# Patient Record
Sex: Female | Born: 1935 | Race: Black or African American | Hispanic: No | Marital: Married | Smoking: Former smoker
Health system: Southern US, Community
[De-identification: ages and names within clinical notes are randomized; demographics above are authoritative.]

## PROBLEM LIST (undated history)

## (undated) DIAGNOSIS — I10 Essential (primary) hypertension: Secondary | ICD-10-CM

## (undated) DIAGNOSIS — I517 Cardiomegaly: Secondary | ICD-10-CM

## (undated) DIAGNOSIS — J189 Pneumonia, unspecified organism: Secondary | ICD-10-CM

## (undated) DIAGNOSIS — C541 Malignant neoplasm of endometrium: Secondary | ICD-10-CM

## (undated) DIAGNOSIS — E785 Hyperlipidemia, unspecified: Secondary | ICD-10-CM

## (undated) DIAGNOSIS — R569 Unspecified convulsions: Secondary | ICD-10-CM

## (undated) DIAGNOSIS — M1990 Unspecified osteoarthritis, unspecified site: Secondary | ICD-10-CM

## (undated) HISTORY — DX: Malignant neoplasm of endometrium: C54.1

## (undated) HISTORY — DX: Hyperlipidemia, unspecified: E78.5

## (undated) HISTORY — DX: Unspecified convulsions: R56.9

## (undated) HISTORY — PX: SPINE SURGERY: SHX786

## (undated) HISTORY — DX: Essential (primary) hypertension: I10

## (undated) HISTORY — DX: Unspecified osteoarthritis, unspecified site: M19.90

## (undated) HISTORY — PX: APPENDECTOMY: SHX54

## (undated) HISTORY — PX: TONSILLECTOMY: SUR1361

## (undated) HISTORY — DX: Cardiomegaly: I51.7

---

## 2001-02-21 ENCOUNTER — Inpatient Hospital Stay (HOSPITAL_COMMUNITY): Admission: RE | Admit: 2001-02-21 | Discharge: 2001-02-22 | Payer: Self-pay | Admitting: Orthopaedic Surgery

## 2003-06-19 ENCOUNTER — Emergency Department (HOSPITAL_COMMUNITY): Admission: EM | Admit: 2003-06-19 | Discharge: 2003-06-19 | Payer: Self-pay | Admitting: Emergency Medicine

## 2005-12-14 ENCOUNTER — Ambulatory Visit: Payer: Self-pay | Admitting: Internal Medicine

## 2005-12-22 ENCOUNTER — Ambulatory Visit: Payer: Self-pay | Admitting: Family Medicine

## 2006-01-26 ENCOUNTER — Ambulatory Visit: Payer: Self-pay | Admitting: Internal Medicine

## 2006-01-27 ENCOUNTER — Encounter: Payer: Self-pay | Admitting: Family Medicine

## 2006-02-03 ENCOUNTER — Ambulatory Visit: Payer: Self-pay | Admitting: Family Medicine

## 2006-02-06 ENCOUNTER — Encounter: Payer: Self-pay | Admitting: Family Medicine

## 2006-02-06 ENCOUNTER — Encounter: Admission: RE | Admit: 2006-02-06 | Discharge: 2006-02-06 | Payer: Self-pay | Admitting: Family Medicine

## 2006-02-10 ENCOUNTER — Ambulatory Visit: Payer: Self-pay | Admitting: Family Medicine

## 2006-02-16 ENCOUNTER — Ambulatory Visit: Payer: Self-pay | Admitting: Family Medicine

## 2006-03-24 ENCOUNTER — Ambulatory Visit: Payer: Self-pay | Admitting: Family Medicine

## 2006-04-26 ENCOUNTER — Ambulatory Visit: Payer: Self-pay | Admitting: Family Medicine

## 2006-06-23 ENCOUNTER — Ambulatory Visit: Payer: Self-pay | Admitting: Family Medicine

## 2006-06-23 LAB — CONVERTED CEMR LAB
Cholesterol: 140 mg/dL (ref 0–200)
HDL: 33.1 mg/dL — ABNORMAL LOW (ref >39.0)
Hgb A1c MFr Bld: 8.2 % — ABNORMAL HIGH (ref 4.6–6.0)
LDL Cholesterol: 87 mg/dL (ref 0–99)
Total CHOL/HDL Ratio: 4.2
Triglycerides: 99 mg/dL (ref 0–149)
VLDL: 20 mg/dL (ref 0–40)

## 2006-06-27 ENCOUNTER — Ambulatory Visit: Payer: Self-pay | Admitting: Family Medicine

## 2006-08-10 ENCOUNTER — Encounter: Payer: Self-pay | Admitting: Family Medicine

## 2006-08-10 DIAGNOSIS — D631 Anemia in chronic kidney disease: Secondary | ICD-10-CM

## 2006-08-10 DIAGNOSIS — E785 Hyperlipidemia, unspecified: Secondary | ICD-10-CM

## 2006-08-10 DIAGNOSIS — N184 Chronic kidney disease, stage 4 (severe): Secondary | ICD-10-CM

## 2006-08-10 DIAGNOSIS — H409 Unspecified glaucoma: Secondary | ICD-10-CM | POA: Insufficient documentation

## 2006-08-10 DIAGNOSIS — I10 Essential (primary) hypertension: Secondary | ICD-10-CM | POA: Insufficient documentation

## 2006-08-10 DIAGNOSIS — F17200 Nicotine dependence, unspecified, uncomplicated: Secondary | ICD-10-CM | POA: Insufficient documentation

## 2006-08-10 DIAGNOSIS — E1101 Type 2 diabetes mellitus with hyperosmolarity with coma: Secondary | ICD-10-CM

## 2006-08-10 DIAGNOSIS — M1990 Unspecified osteoarthritis, unspecified site: Secondary | ICD-10-CM | POA: Insufficient documentation

## 2006-08-10 DIAGNOSIS — J690 Pneumonitis due to inhalation of food and vomit: Secondary | ICD-10-CM | POA: Insufficient documentation

## 2006-09-21 ENCOUNTER — Ambulatory Visit: Payer: Self-pay | Admitting: Family Medicine

## 2006-09-21 LAB — CONVERTED CEMR LAB
BUN: 18 mg/dL (ref 6–23)
CO2: 29 meq/L (ref 19–32)
Calcium: 9 mg/dL (ref 8.4–10.5)
Chloride: 110 meq/L (ref 96–112)
Creatinine, Ser: 1.3 mg/dL — ABNORMAL HIGH (ref 0.4–1.2)
Ferritin: 62.7 ng/mL (ref 10.0–291.0)
GFR calc Af Amer: 52 mL/min
GFR calc non Af Amer: 43 mL/min
Glucose, Bld: 93 mg/dL (ref 70–99)
Hemoglobin: 11.7 g/dL — ABNORMAL LOW (ref 12.0–15.0)
Hgb A1c MFr Bld: 8 % — ABNORMAL HIGH (ref 4.6–6.0)
Iron: 36 ug/dL — ABNORMAL LOW (ref 42–145)
Potassium: 4.2 meq/L (ref 3.5–5.1)
Saturation Ratios: 12.2 % — ABNORMAL LOW (ref 20.0–50.0)
Sodium: 144 meq/L (ref 135–145)
Transferrin: 210.8 mg/dL — ABNORMAL LOW (ref >=212.0)

## 2006-09-25 ENCOUNTER — Ambulatory Visit: Payer: Self-pay | Admitting: Family Medicine

## 2006-09-25 LAB — CONVERTED CEMR LAB
Cholesterol, target level: 200 mg/dL
HDL goal, serum: 40 mg/dL
LDL Goal: 100 mg/dL

## 2006-12-11 ENCOUNTER — Encounter (INDEPENDENT_AMBULATORY_CARE_PROVIDER_SITE_OTHER): Payer: Self-pay | Admitting: *Deleted

## 2006-12-20 ENCOUNTER — Ambulatory Visit: Payer: Self-pay | Admitting: Family Medicine

## 2006-12-20 DIAGNOSIS — D649 Anemia, unspecified: Secondary | ICD-10-CM

## 2006-12-22 LAB — CONVERTED CEMR LAB
AST: 9 units/L (ref 0–37)
BUN: 21 mg/dL (ref 6–23)
Calcium: 9.3 mg/dL (ref 8.4–10.5)
Chloride: 106 meq/L (ref 96–112)
GFR calc Af Amer: 41 mL/min
GFR calc non Af Amer: 34 mL/min
Hemoglobin: 12.6 g/dL (ref 12.0–15.0)
Hgb A1c MFr Bld: 8.4 % — ABNORMAL HIGH (ref 4.6–6.0)
LDL Cholesterol: 96 mg/dL (ref 0–99)
VLDL: 22 mg/dL (ref 0–40)

## 2006-12-27 ENCOUNTER — Ambulatory Visit: Payer: Self-pay | Admitting: Family Medicine

## 2007-03-28 ENCOUNTER — Ambulatory Visit: Payer: Self-pay | Admitting: Family Medicine

## 2007-03-30 ENCOUNTER — Ambulatory Visit: Payer: Self-pay | Admitting: Family Medicine

## 2007-06-29 ENCOUNTER — Ambulatory Visit: Payer: Self-pay | Admitting: Family Medicine

## 2007-07-03 ENCOUNTER — Ambulatory Visit: Payer: Self-pay | Admitting: Family Medicine

## 2007-07-03 LAB — CONVERTED CEMR LAB
CO2: 29 meq/L (ref 19–32)
Chloride: 106 meq/L (ref 96–112)
GFR calc Af Amer: 44 mL/min
GFR calc non Af Amer: 36 mL/min
Glucose, Bld: 89 mg/dL (ref 70–99)
Sodium: 142 meq/L (ref 135–145)

## 2007-09-22 ENCOUNTER — Emergency Department (HOSPITAL_COMMUNITY): Admission: EM | Admit: 2007-09-22 | Discharge: 2007-09-22 | Payer: Self-pay | Admitting: Family Medicine

## 2007-10-10 ENCOUNTER — Ambulatory Visit: Payer: Self-pay | Admitting: Family Medicine

## 2007-10-16 ENCOUNTER — Ambulatory Visit: Payer: Self-pay | Admitting: Family Medicine

## 2007-10-16 LAB — CONVERTED CEMR LAB
ALT: 14 units/L (ref 0–35)
AST: 9 units/L (ref 0–37)
Albumin: 3.4 g/dL — ABNORMAL LOW (ref 3.5–5.2)
Alkaline Phosphatase: 127 units/L — ABNORMAL HIGH (ref 39–117)
BUN: 30 mg/dL — ABNORMAL HIGH (ref 6–23)
Bilirubin, Direct: 0.1 mg/dL (ref 0.0–0.3)
CO2: 28 meq/L (ref 19–32)
Calcium: 8.8 mg/dL (ref 8.4–10.5)
Chloride: 111 meq/L (ref 96–112)
Cholesterol: 137 mg/dL (ref 0–200)
Creatinine, Ser: 2 mg/dL — ABNORMAL HIGH (ref 0.4–1.2)
GFR calc Af Amer: 31 mL/min
GFR calc non Af Amer: 26 mL/min
Glucose, Bld: 66 mg/dL — ABNORMAL LOW (ref 70–99)
HDL: 31.7 mg/dL — ABNORMAL LOW (ref >39.0)
Hgb A1c MFr Bld: 7.6 % — ABNORMAL HIGH (ref 4.6–6.0)
LDL Cholesterol: 91 mg/dL (ref 0–99)
Potassium: 3.6 meq/L (ref 3.5–5.1)
Sodium: 144 meq/L (ref 135–145)
Total Bilirubin: 0.5 mg/dL (ref 0.3–1.2)
Total CHOL/HDL Ratio: 4.3
Total Protein: 7.2 g/dL (ref 6.0–8.3)
Triglycerides: 70 mg/dL (ref 0–149)
VLDL: 14 mg/dL (ref 0–40)

## 2007-11-07 ENCOUNTER — Encounter (INDEPENDENT_AMBULATORY_CARE_PROVIDER_SITE_OTHER): Payer: Self-pay | Admitting: *Deleted

## 2008-01-02 ENCOUNTER — Encounter: Payer: Self-pay | Admitting: Family Medicine

## 2008-01-10 ENCOUNTER — Encounter (INDEPENDENT_AMBULATORY_CARE_PROVIDER_SITE_OTHER): Payer: Self-pay | Admitting: *Deleted

## 2008-01-15 ENCOUNTER — Encounter: Payer: Self-pay | Admitting: Family Medicine

## 2008-01-21 ENCOUNTER — Ambulatory Visit: Payer: Self-pay | Admitting: Family Medicine

## 2008-01-21 LAB — CONVERTED CEMR LAB
CO2: 28 meq/L (ref 19–32)
Calcium: 8.8 mg/dL (ref 8.4–10.5)
Chloride: 114 meq/L — ABNORMAL HIGH (ref 96–112)
Glucose, Bld: 131 mg/dL — ABNORMAL HIGH (ref 70–99)
Hgb A1c MFr Bld: 7.4 % — ABNORMAL HIGH (ref 4.6–6.0)
Potassium: 4.4 meq/L (ref 3.5–5.1)
Sodium: 144 meq/L (ref 135–145)

## 2008-01-24 ENCOUNTER — Ambulatory Visit: Payer: Self-pay | Admitting: Family Medicine

## 2008-04-03 ENCOUNTER — Encounter: Payer: Self-pay | Admitting: Family Medicine

## 2008-04-16 ENCOUNTER — Encounter (INDEPENDENT_AMBULATORY_CARE_PROVIDER_SITE_OTHER): Payer: Self-pay | Admitting: *Deleted

## 2008-04-28 ENCOUNTER — Ambulatory Visit: Payer: Self-pay | Admitting: Family Medicine

## 2008-04-30 ENCOUNTER — Ambulatory Visit: Payer: Self-pay | Admitting: Family Medicine

## 2008-04-30 LAB — CONVERTED CEMR LAB
Albumin: 3.5 g/dL (ref 3.5–5.2)
Alkaline Phosphatase: 88 units/L (ref 39–117)
BUN: 26 mg/dL — ABNORMAL HIGH (ref 6–23)
Calcium: 9.1 mg/dL (ref 8.4–10.5)
Cholesterol: 116 mg/dL (ref 0–200)
Creatinine, Ser: 1.7 mg/dL — ABNORMAL HIGH (ref 0.4–1.2)
Creatinine,U: 85.5 mg/dL
GFR calc Af Amer: 38 mL/min
GFR calc non Af Amer: 31 mL/min
Glucose, Bld: 92 mg/dL (ref 70–99)
HDL: 39.2 mg/dL (ref >39.0)
Hgb A1c MFr Bld: 7.4 % — ABNORMAL HIGH (ref 4.6–6.0)
LDL Cholesterol: 58 mg/dL (ref 0–99)
Microalb Creat Ratio: 469 mg/g — ABNORMAL HIGH (ref 0.0–30.0)
Potassium: 4 meq/L (ref 3.5–5.1)
Total Protein: 7.5 g/dL (ref 6.0–8.3)
Triglycerides: 95 mg/dL (ref 0–149)
VLDL: 19 mg/dL (ref 0–40)

## 2008-05-19 ENCOUNTER — Ambulatory Visit: Payer: Self-pay | Admitting: Family Medicine

## 2008-09-26 ENCOUNTER — Encounter: Payer: Self-pay | Admitting: Family Medicine

## 2008-10-23 ENCOUNTER — Ambulatory Visit: Payer: Self-pay | Admitting: Family Medicine

## 2008-10-28 ENCOUNTER — Ambulatory Visit: Payer: Self-pay | Admitting: Family Medicine

## 2008-10-31 LAB — CONVERTED CEMR LAB
ALT: 12 units/L (ref 0–35)
AST: 11 units/L (ref 0–37)
Albumin: 3.6 g/dL (ref 3.5–5.2)
BUN: 25 mg/dL — ABNORMAL HIGH (ref 6–23)
Chloride: 111 meq/L (ref 96–112)
Cholesterol: 129 mg/dL (ref 0–200)
Creatinine, Ser: 1.7 mg/dL — ABNORMAL HIGH (ref 0.4–1.2)
GFR calc non Af Amer: 37.79 mL/min (ref >60)
Glucose, Bld: 77 mg/dL (ref 70–99)
Potassium: 4.3 meq/L (ref 3.5–5.1)
Total Bilirubin: 0.7 mg/dL (ref 0.3–1.2)
Triglycerides: 92 mg/dL (ref 0.0–149.0)

## 2008-11-10 ENCOUNTER — Encounter: Payer: Self-pay | Admitting: Family Medicine

## 2009-01-21 ENCOUNTER — Ambulatory Visit: Payer: Self-pay | Admitting: Family Medicine

## 2009-01-27 ENCOUNTER — Ambulatory Visit: Payer: Self-pay | Admitting: Family Medicine

## 2009-03-12 ENCOUNTER — Ambulatory Visit: Payer: Self-pay | Admitting: Family Medicine

## 2009-03-17 ENCOUNTER — Ambulatory Visit: Payer: Self-pay | Admitting: Family Medicine

## 2009-03-18 ENCOUNTER — Encounter (INDEPENDENT_AMBULATORY_CARE_PROVIDER_SITE_OTHER): Payer: Self-pay | Admitting: *Deleted

## 2009-03-18 LAB — CONVERTED CEMR LAB: Fecal Occult Bld: NEGATIVE

## 2009-04-07 ENCOUNTER — Encounter: Admission: RE | Admit: 2009-04-07 | Discharge: 2009-04-07 | Payer: Self-pay | Admitting: Family Medicine

## 2009-04-10 ENCOUNTER — Encounter (INDEPENDENT_AMBULATORY_CARE_PROVIDER_SITE_OTHER): Payer: Self-pay | Admitting: *Deleted

## 2009-06-01 ENCOUNTER — Ambulatory Visit: Payer: Self-pay | Admitting: Family Medicine

## 2009-06-01 LAB — CONVERTED CEMR LAB
ALT: 16 units/L (ref 0–35)
AST: 10 units/L (ref 0–37)
Albumin: 3.3 g/dL — ABNORMAL LOW (ref 3.5–5.2)
Alkaline Phosphatase: 86 units/L (ref 39–117)
Basophils Absolute: 0.1 10*3/uL (ref 0.0–0.1)
Bilirubin, Direct: 0.1 mg/dL (ref 0.0–0.3)
CO2: 29 meq/L (ref 19–32)
Chloride: 112 meq/L (ref 96–112)
Creatinine,U: 43.2 mg/dL
Glucose, Bld: 89 mg/dL (ref 70–99)
HCT: 37.8 % (ref 36.0–46.0)
Hgb A1c MFr Bld: 8 % — ABNORMAL HIGH (ref 4.6–6.5)
Lymphocytes Relative: 18 % (ref 12.0–46.0)
Lymphs Abs: 1.9 10*3/uL (ref 0.7–4.0)
Microalb Creat Ratio: 1643.5 mg/g — ABNORMAL HIGH (ref 0.0–30.0)
Microalb, Ur: 71 mg/dL — ABNORMAL HIGH (ref 0.0–1.9)
Monocytes Relative: 5.1 % (ref 3.0–12.0)
Neutrophils Relative %: 73.7 % (ref 43.0–77.0)
Platelets: 337 10*3/uL (ref 150.0–400.0)
RDW: 15.2 % — ABNORMAL HIGH (ref 11.5–14.6)
Sodium: 145 meq/L (ref 135–145)
Total CHOL/HDL Ratio: 4
Total Protein: 7.2 g/dL (ref 6.0–8.3)

## 2009-06-05 ENCOUNTER — Ambulatory Visit: Payer: Self-pay | Admitting: Family Medicine

## 2009-08-05 ENCOUNTER — Encounter: Payer: Self-pay | Admitting: Family Medicine

## 2009-08-05 LAB — HM DIABETES EYE EXAM

## 2009-08-11 DIAGNOSIS — H349 Unspecified retinal vascular occlusion: Secondary | ICD-10-CM | POA: Insufficient documentation

## 2009-08-25 ENCOUNTER — Ambulatory Visit: Payer: Self-pay | Admitting: Family Medicine

## 2009-08-27 LAB — CONVERTED CEMR LAB
CO2: 29 meq/L (ref 19–32)
Calcium: 8.9 mg/dL (ref 8.4–10.5)
Potassium: 4 meq/L (ref 3.5–5.1)
Sodium: 142 meq/L (ref 135–145)

## 2009-09-04 ENCOUNTER — Ambulatory Visit: Payer: Self-pay | Admitting: Family Medicine

## 2009-11-27 ENCOUNTER — Ambulatory Visit: Payer: Self-pay | Admitting: Family Medicine

## 2009-11-30 LAB — CONVERTED CEMR LAB
AST: 9 units/L (ref 0–37)
Alkaline Phosphatase: 89 units/L (ref 39–117)
Basophils Relative: 0.7 % (ref 0.0–3.0)
Eosinophils Relative: 2.1 % (ref 0.0–5.0)
GFR calc non Af Amer: 36.68 mL/min (ref >60)
Glucose, Bld: 94 mg/dL (ref 70–99)
Hgb A1c MFr Bld: 8.6 % — ABNORMAL HIGH (ref 4.6–6.5)
LDL Cholesterol: 109 mg/dL — ABNORMAL HIGH (ref 0–99)
Lymphocytes Relative: 22.7 % (ref 12.0–46.0)
Neutrophils Relative %: 68.8 % (ref 43.0–77.0)
Potassium: 4.5 meq/L (ref 3.5–5.1)
RBC: 4.79 M/uL (ref 3.87–5.11)
Sodium: 145 meq/L (ref 135–145)
Total Bilirubin: 0.4 mg/dL (ref 0.3–1.2)
VLDL: 20.8 mg/dL (ref 0.0–40.0)
WBC: 10.3 10*3/uL (ref 4.5–10.5)

## 2009-12-04 ENCOUNTER — Ambulatory Visit: Payer: Self-pay | Admitting: Family Medicine

## 2010-02-23 ENCOUNTER — Encounter (INDEPENDENT_AMBULATORY_CARE_PROVIDER_SITE_OTHER): Payer: Self-pay | Admitting: *Deleted

## 2010-05-31 ENCOUNTER — Other Ambulatory Visit: Payer: Self-pay | Admitting: Family Medicine

## 2010-05-31 ENCOUNTER — Ambulatory Visit
Admission: RE | Admit: 2010-05-31 | Discharge: 2010-05-31 | Payer: Self-pay | Source: Home / Self Care | Attending: Family Medicine | Admitting: Family Medicine

## 2010-05-31 LAB — LIPID PANEL
Cholesterol: 198 mg/dL (ref 0–200)
HDL: 44.6 mg/dL (ref >39.00)
LDL Cholesterol: 133 mg/dL — ABNORMAL HIGH (ref 0–99)
Total CHOL/HDL Ratio: 4
Triglycerides: 103 mg/dL (ref 0.0–149.0)
VLDL: 20.6 mg/dL (ref 0.0–40.0)

## 2010-05-31 LAB — HEMOGLOBIN A1C: Hgb A1c MFr Bld: 8.6 % — ABNORMAL HIGH (ref 4.6–6.5)

## 2010-06-04 ENCOUNTER — Ambulatory Visit
Admission: RE | Admit: 2010-06-04 | Discharge: 2010-06-04 | Payer: Self-pay | Source: Home / Self Care | Attending: Family Medicine | Admitting: Family Medicine

## 2010-06-04 LAB — HM DIABETES FOOT EXAM

## 2010-06-22 NOTE — Assessment & Plan Note (Signed)
Summary: 3 M F/U DLO   Vital Signs:  Patient profile:   77 year old female Weight:      188 pounds Temp:     98.5 degrees F oral Pulse rate:   76 / minute Pulse rhythm:   regular BP sitting:   150 / 80  (left arm) Cuff size:   regular  Vitals Entered ByJanee Morn CMA (December 04, 2009 9:43 AM) CC: 3 month f/u, Lipid Management   History of Present Illness: Worsened DM, control: Placed  januvia and NPH... occ forgets to take insulin. This AM FBS 200s. She states she is not taking [place]..forgot to take.  She admits very poor diet and no exercsie.   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  Pain in right leg intermittant x 2-3 weeks. Better in this week...100% gone.  No back pain.  Pain only with standing... started in buttock and radiated to right foot.  HTN, she feels up due to racing here. At home BPs running...135/76  Lipid Management History:      Positive NCEP/ATP III risk factors include female age 52 years old or older, diabetes, current tobacco user, and hypertension.        The patient states that she does not know about the "Therapeutic Lifestyle Change" diet.  Her compliance with the TLC diet is fair.  Adjunctive measures started by the patient include aerobic exercise, limit alcohol consumpton, and weight reduction.  She expresses no side effects from her lipid-lowering medication.  The patient denies any symptoms to suggest myopathy or liver disease.  Comments: Occ misses medication. .   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 Take 1 Tablet By Mouth Once A Day 2)  Geritol Complete .... Take 1 By Mouth Daily 3)  Altace 10 Mg Caps (Ramipril) .... Take 1 Tablet By Mouth Two Times A Day 4)  Nph Insulin .... Rely On  35 Units Qam  25 Units Qpm 5)  Metoprolol Succinate 200 Mg Xr24h-Tab (Metoprolol Succinate) .Marland Kitchen.. 1 Tab By Mouth Daily 6)  Simvastatin 80 Mg  Tabs (Simvastatin) .... Take 1 Tablet By Mouth Once A Day 7)  Zetia 10 Mg Tabs (Ezetimibe) .... Take 1 Tablet By Mouth Once A Day 8)  Lancets  Misc (Lancets) .... Check Blood Sugar Daily 9)  Onetouch Ultra Test   Strp (Glucose Blood) .... Test Blood Sugar Once Daily 10)  Furosemide 40 Mg Tabs (Furosemide) .... Take 1 Tablet By Mouth Once A Day 11)  Januvia 100 Mg Tabs (Sitagliptin Phosphate) .Marland Kitchen.. 1 Tab By Mouth Daily  Allergies: 1)  ! * Insulin  Past History:  Past medical, surgical, family and social histories (including risk factors) reviewed, and no changes noted (except as noted  below).  Past Medical History: Reviewed history from 08/10/2006 and no changes required. Diabetes mellitus, type II Hyperlipidemia Hypertension Osteoarthritis hx/ hosp for HONK, aspiration PNA, and seizure  Past Surgical History: Reviewed history from 08/10/2006 and no changes required. Appendectomy Tonsillectomy 2004: cervical surgery for bone spurs 01/2006: renal US: B renal cysts  Family History: Reviewed history and no changes required.  Social History: Reviewed history and no changes required.  Review of Systems General:  Denies fatigue and fever. CV:  Denies chest pain or discomfort. Resp:  Denies shortness  of breath. GI:  Denies abdominal pain. GU:  Denies dysuria and genital sores.  Physical Exam  General:  elderly female appears younger than stated age Mouth:  Oral mucosa and oropharynx without lesions or exudates.  Teeth in good repair. Neck:  no carotid bruit or thyromegaly no cervical or supraclavicular lymphadenopathy  Lungs:  Normal respiratory effort, chest expands symmetrically. Lungs are clear to auscultation, no crackles or wheezes. Heart:  Normal rate and regular rhythm. S1 and S2 normal without gallop, murmur, click, rub or other extra sounds. Abdomen:  Bowel sounds positive,abdomen soft and non-tender without masses, organomegaly or hernias noted. Msk:  No low back pain, neg SLR Pulses:  R and L posterior tibial pulses are full and equal bilaterally  Extremities:  no edema Neurologic:  No cranial nerve deficits noted. Station and gait are normal. . Sensory, motor and coordinative functions appear intact.  Diabetes Management Exam:    Foot Exam (with socks and/or shoes not present):       Sensory-Pinprick/Light touch:          Left medial foot (L-4): normal          Left dorsal foot (L-5): normal          Left lateral foot (S-1): normal          Right medial foot (L-4): normal          Right dorsal foot (L-5): normal          Right lateral foot (S-1): normal       Sensory-Monofilament:          Left foot: normal          Right foot: normal       Inspection:          Left foot: normal          Right foot: normal       Nails:          Left foot: normal          Right foot: normal   Impression & Recommendations:  Problem # 1:  SCIATICA, ACUTE (ICD-724.3) Resolved. Work on restarting gentle exercsie.   Problem # 2:  HYPERTENSION (ICD-401.9)  Controlled per pt at home.Marland Kitchenalthough not quite at goal. Pt refuses med change.  Her updated medication list for this problem includes:    Amlodipine Besylate 10 Mg Tabs (Amlodipine besylate) .Marland Kitchen... Take 1 tablet by mouth once a  day    Altace 10 Mg Caps (Ramipril) .Marland Kitchen... Take 1 tablet by mouth two times a day    Metoprolol Succinate 200 Mg Xr24h-tab (Metoprolol succinate) .Marland Kitchen... 1 tab by mouth daily    Furosemide 40 Mg Tabs (Furosemide) .Marland Kitchen... Take 1 tablet by mouth once a day  BP today: 150/80 Prior BP: 140/82 (09/04/2009)  Prior 10 Yr Risk Heart Disease: > 32 % (06/05/2009)  Labs Reviewed: K+: 4.5 (11/27/2009) Creat: : 1.7 (11/27/2009)   Chol: 172 (  11/27/2009)   HDL: 42.50 (11/27/2009)   LDL: 109 (11/27/2009)   TG: 104.0 (11/27/2009)  Problem # 3:  HYPERLIPIDEMIA (ICD-272.4)  Inadequate control. Pt noncompliant with diet and lifestyle. discussed options.Gayla Doss refused med change.  Her updated medication list for this problem includes:    Simvastatin 80 Mg Tabs (Simvastatin) .Marland Kitchen... Take 1 tablet by mouth once a day    Zetia 10 Mg Tabs (Ezetimibe) .Marland Kitchen... Take 1 tablet by mouth once a day  Labs Reviewed: SGOT: 9 (11/27/2009)   SGPT: 15 (11/27/2009)  Lipid Goals: Chol Goal: 200 (09/25/2006)   HDL Goal: 40 (09/25/2006)   LDL Goal: 100 (09/25/2006)   TG Goal: 150 (09/25/2006)  Prior 10 Yr Risk Heart Disease: > 32 % (06/05/2009)   HDL:42.50 (11/27/2009), 36.90 (06/01/2009)  LDL:109 (11/27/2009), 84 (25/95/6387)  Chol:172 (11/27/2009), 138 (06/01/2009)  Trig:104.0 (11/27/2009), 86.0 (06/01/2009)  Problem # 4:  DIABETES MELLITUS, TYPE II (ICD-250.00) COntinued inadequate control and severe pt noncompliance. I suggested changing NPH to 70/30  to get mealtime coverage given poor diet.Gayla Doss refused. She agreed to increasing NPH dose and restarting [place].  Her updated medication list for this problem includes:    Altace 10 Mg Caps (Ramipril) .Marland Kitchen... Take 1 tablet by mouth two times a day    Januvia 100 Mg Tabs (Sitagliptin phosphate) .Marland Kitchen... 1 tab by mouth daily  Complete Medication List: 1)  Amlodipine Besylate 10 Mg Tabs (Amlodipine besylate) .... Take 1 tablet by mouth once a day 2)  Geritol Complete   .... Take 1 by mouth daily 3)  Altace 10 Mg Caps (Ramipril) .... Take 1 tablet by mouth two times a day 4)  Nph Insulin  .... Rely on  35 units qam  25 units qpm 5)  Metoprolol Succinate 200 Mg Xr24h-tab (Metoprolol succinate) .Marland Kitchen.. 1 tab by mouth daily 6)  Simvastatin 80 Mg Tabs (Simvastatin) .... Take 1 tablet by mouth once a day 7)  Zetia 10 Mg Tabs (Ezetimibe) .... Take 1 tablet by mouth once a day 8)  Lancets Misc (Lancets) .... Check blood sugar daily 9)  Onetouch Ultra Test Strp (Glucose blood) .... Test blood sugar once daily 10)  Furosemide 40 Mg Tabs (Furosemide) .... Take 1 tablet by mouth once a day 11)  Januvia 100 Mg Tabs (Sitagliptin phosphate) .Marland Kitchen.. 1 tab by mouth daily  Lipid Assessment/Plan:      Based on NCEP/ATP III, the patient's risk factor category is "history of diabetes".  The patient's lipid goals are as follows: Total cholesterol goal is 200; LDL cholesterol goal is 100; HDL cholesterol goal is 40; Triglyceride goal is 150.  Her LDL cholesterol goal has not been met.    Patient Instructions: 1)  Restart Januvia. 2)  Increase NPH to 40 UNits and 30 UNits at night. 3)   Please schedule a follow-up appointment in 3 months .  4)  HgBA1c prior to visit  ICD-9: 250.00 5)  Lipid panel prior to visit ICD-9 : 272.0  Current Allergies (reviewed today): ! * INSULIN

## 2010-06-22 NOTE — Assessment & Plan Note (Signed)
Summary: 3 M F/U DLO   Vital Signs:  Patient profile:   77 year old female Weight:      189.6 pounds Temp:     98.1 degrees F oral Pulse rate:   72 / minute Pulse rhythm:   regular BP sitting:   140 / 82  (left arm) Cuff size:   regular  Vitals Entered By: Lowella Petties CMA (September 04, 2009 8:45 AM) CC: 3 month follow up   History of Present Illness: DM, poor control on insulin. pt very nocompliant with diet and exercisie but takes meds regularly.  Has had right eye changes per eye MD from poor control BP. Has been elevated occ at home.   HTN, poor control  CKD, stable Cr. Needs better BP control.   Quit smoking 2 months ago!  Has noted some cold intolerance...no fatigue.   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 Take 1 Tablet By Mouth Once A Day 2)  Geritol Complete .... Take 1 By Mouth Daily 3)  Altace 10 Mg Caps (Ramipril) .... Take 1 Tablet By Mouth Two Times A Day 4)  Nph Insulin .... Rely On  35 Units Qam  25 Units Qpm 5)  Toprol Xl 100 Mg Tb24 (Metoprolol Succinate) .... Take 1 Tablet By Mouth Once A Day 6)  Simvastatin 80 Mg  Tabs (Simvastatin) .... Take 1 Tablet By Mouth Once A Day 7)  Zetia 10 Mg Tabs (Ezetimibe) ....  Take 1 Tablet By Mouth Once A Day 8)  Lancets  Misc (Lancets) .... Check Blood Sugar Daily 9)  Onetouch Ultra Test   Strp (Glucose Blood) .... Test Blood Sugar Once Daily 10)  Metformin Hcl 500 Mg Xr24h-Tab (Metformin Hcl) .... Take 1 Tablet By Mouth Once A Day 11)  Toprol Xl 50 Mg Xr24h-Tab (Metoprolol Succinate) .... Take 1 Tablet By Mouth Once A Day 12)  Furosemide 40 Mg Tabs (Furosemide) .... Take 1 Tablet By Mouth Once A Day  Allergies (verified): 1)  ! * Insulin   Impression & Recommendations:  Problem # 1:  HYPERTENSION (ICD-401.9) inadequate control. Increase metoprolol succinate to 200 mg daily. Encouraged exercise, weight loss, healthy eating habits.  The following medications were removed from the medication list:    Toprol Xl 50 Mg Xr24h-tab (Metoprolol succinate) .Marland Kitchen... Take 1 tablet by mouth once a day Her updated medication list for this problem includes:    Amlodipine Besylate 10 Mg Tabs (Amlodipine besylate) .Marland Kitchen... Take 1 tablet by mouth once a day    Altace 10 Mg Caps (Ramipril) .Marland Kitchen... Take 1 tablet by mouth two times a day    Metoprolol Succinate 200 Mg Xr24h-tab (Metoprolol succinate) .Marland Kitchen... 1 tab by mouth daily    Furosemide 40 Mg Tabs (Furosemide) .Marland Kitchen... Take 1 tablet by  mouth once a day  Problem # 2:  DIABETES MELLITUS, TYPE II (ICD-250.00) Increase NPH by 4 Units total and cahnge to JAnuvia 100 mg daily. Encouraged exercise and weight loss as well as improving diet. HAs ver ypoor diet and is not very motivated to change.  The following medications were removed from the medication list:    Metformin Hcl 500 Mg Xr24h-tab (Metformin hcl) .Marland Kitchen... Take 1 tablet by mouth once a day Her updated medication list for this problem includes:    Altace 10 Mg Caps (Ramipril) .Marland Kitchen... Take 1 tablet by mouth two times a day    Januvia 100 Mg Tabs (Sitagliptin phosphate) .Marland Kitchen... 1 tab by mouth daily  Problem # 3:  RENAL DISEASE, CHRONIC, STAGE III DUE TO NEPHROSCLEROSIS (ICD-585.3) Gioven  renal impairment.Marland Kitchenand limited effect of metformin..will stop.   Problem # 4:  TOBACCO ABUSE (ICD-305.1) Quit smoking!!  Complete Medication List: 1)  Amlodipine Besylate 10 Mg Tabs (Amlodipine besylate) .... Take 1 tablet by mouth once a day 2)  Geritol Complete  .... Take 1 by mouth daily 3)  Altace 10 Mg Caps (Ramipril) .... Take 1 tablet by mouth two times a day 4)  Nph Insulin  .... Rely on  35 units qam  25 units qpm 5)  Metoprolol Succinate 200 Mg Xr24h-tab (Metoprolol succinate) .Marland Kitchen.. 1 tab by mouth daily 6)  Simvastatin 80 Mg Tabs (Simvastatin) .... Take 1 tablet by mouth once a day 7)  Zetia 10 Mg Tabs (Ezetimibe) .... Take 1 tablet by mouth once a day 8)  Lancets Misc (Lancets) .... Check blood sugar daily 9)  Onetouch Ultra Test Strp (Glucose blood) .... Test blood sugar once daily 10)  Furosemide 40 Mg Tabs (Furosemide) .... Take 1 tablet by mouth once a day 11)  Januvia 100 Mg Tabs (Sitagliptin phosphate) .Marland Kitchen.. 1 tab by mouth daily  Patient Instructions: 1)  Follow BP and pulse at home.. Call if greater than 130/80 or pulse <60 regularly 2)  Increase metoprolol to 200 mg daily. 3)  Stop metformin and change to [place].   4)  Increase NPH to 27 Units PM and 37 Units PM.  5)  Please try to work on diet changes.  6)  Please schedule a follow-up appointment in 3 months .  7)  BMP prior to visit, ICD-9: 250.00 8)  Hepatic Panel prior to visit ICD-9:  9)  Lipid panel prior to visit ICD-9 :  10)  HgBA1c prior to visit  ICD-9:  11)  Cbc and TSH Dx 401.1  Prescriptions: JANUVIA 100 MG TABS (SITAGLIPTIN PHOSPHATE) 1 tab by mouth daily  #30 x 11   Entered and Authorized by:   Kerby Nora MD   Signed by:   Kerby Nora MD on 09/04/2009   Method used:   Electronically to        [facility] [address] (retail)       [address], [address]       [place], [place]  [address]       Ph: [phone]       Fax: [phone]   RxID:    [id number] METOPROLOL SUCCINATE 200 MG XR24H-TAB (METOPROLOL SUCCINATE) 1 tab by mouth daily  #90 x 3   Entered and Authorized by:   Kerby Nora MD   Signed by:   Kerby Nora MD on 09/04/2009   Method used:   Electronically to  [facility] [address] (retail)       [address]       [place], [place]  [address]       Ph: [phone]       Fax: [phone]   RxID:   [id number]   Prior Medications (reviewed today): AMLODIPINE BESYLATE 10 MG TABS (AMLODIPINE BESYLATE) Take 1 tablet by mouth once a day GERITOL COMPLETE () Take 1 by mouth daily ALTACE 10 MG CAPS (RAMIPRIL) Take 1 tablet by mouth two times a day NPH INSULIN () Rely On  35 units qam  25 units qpm SIMVASTATIN 80 MG  TABS (SIMVASTATIN) Take 1 tablet by mouth once a day ZETIA 10 MG TABS (EZETIMIBE) Take 1 tablet by mouth once a day LANCETS  MISC (LANCETS) Check blood sugar daily ONETOUCH ULTRA TEST   STRP (GLUCOSE BLOOD) Test blood sugar once daily FUROSEMIDE 40 MG TABS (FUROSEMIDE) Take 1 tablet by mouth once a day Current Allergies (reviewed today): ! * INSULIN  Appended Document: 3 M F/U DLO     Allergies: 1)  ! * Insulin  Review of Systems General:  Denies fatigue and fever. CV:  Denies chest pain or discomfort. Resp:  Denies shortness of breath. GI:  Denies abdominal pain. Endo:  Complains of cold intolerance.  Physical Exam  General:  elderly female appears younger than stated age Mouth:  Oral mucosa and oropharynx without lesions or exudates.  Teeth in good repair. Neck:  no carotid bruit or thyromegaly no cervical or supraclavicular lymphadenopathy  Lungs:  Normal respiratory effort, chest expands symmetrically. Lungs are clear to auscultation, no crackles or wheezes. Heart:  Normal rate and regular rhythm. S1 and S2 normal without gallop, murmur, click, rub or other extra sounds. Pulses:  R and L posterior tibial pulses are full and equal  bilaterally  Extremities:  no edema  Diabetes Management Exam:    Foot Exam (with socks and/or shoes not present):       Sensory-Pinprick/Light touch:          Left medial foot (L-4): normal          Left dorsal foot (L-5): normal          Left lateral foot (S-1): normal          Right medial foot (L-4): normal          Right dorsal foot (L-5): normal          Right lateral foot (S-1): normal       Sensory-Monofilament:          Left foot: normal          Right foot: normal       Inspection:          Left foot: normal          Right foot: normal       Nails:          Left foot: normal          Right foot: normal   Complete Medication List: 1)  Amlodipine Besylate 10 Mg Tabs (Amlodipine besylate) .... Take 1 tablet by mouth once a day 2)  Geritol Complete  .... Take 1 by mouth daily 3)  Altace 10 Mg Caps (Ramipril) .... Take 1 tablet by mouth two times a day 4)  Nph Insulin  .... Rely on  35 units qam  25 units qpm 5)  Metoprolol  Succinate 200 Mg Xr24h-tab (Metoprolol succinate) .Marland Kitchen.. 1 tab by mouth daily 6)  Simvastatin 80 Mg Tabs (Simvastatin) .... Take 1 tablet by mouth once a day 7)  Zetia 10 Mg Tabs (Ezetimibe) .... Take 1 tablet by mouth once a day 8)  Lancets Misc (Lancets) .... Check blood sugar daily 9)  Onetouch Ultra Test Strp (Glucose blood) .... Test blood sugar once daily 10)  Furosemide 40 Mg Tabs (Furosemide) .... Take 1 tablet by mouth once a day 11)  Januvia 100 Mg Tabs (Sitagliptin phosphate) .Marland Kitchen.. 1 tab by mouth daily

## 2010-06-22 NOTE — Letter (Signed)
Summary: [facility]   Imported By: Lanelle Bal 08/10/2009 11:04:44  _____________________________________________________________________  External Attachment:    Type:   Image     Comment:   External Document  Appended Document: Orders Update    Clinical Lists Changes  Problems: Added new problem of CENTRAL RETINAL VEIN OCCLUSION (ICD-362.30) Observations: Added new observation of EYES COMMENT: 08/2010 (08/11/2009 16:58) Added new observation of DMEYEEXMRES: no diabetic retinopathy, referred to retinal specialist for central retinal vein occlusion.  (08/05/2009 16:59) Added new observation of DIAB EYE EX: no diabetic retinopathy, referred to retinal specialist for central retinal vein occlusion.  (08/05/2009 16:59)       Diabetes Management History:      She is checking home blood sugars.  She says that she is not exercising regularly.    Diabetes Management Exam:    Eye Exam:       Eye Exam done elsewhere          Date: 08/05/2009          Results: no diabetic retinopathy, referred to retinal specialist for central retinal vein occlusion.           Done by: eye MD  Diabetes Management Assessment/Plan:      The following lipid goals have been established for the patient: Total cholesterol goal of 200; LDL cholesterol goal of 100; HDL cholesterol goal of 40; Triglyceride goal of 150.  Her blood pressure goal is < 130/80.

## 2010-06-22 NOTE — Letter (Signed)
Summary: [place] No Show Letter  [place] [facility]  [address] [place], [place] [address]   Phone: [phone]  Fax: [phone]    02/23/2010 MRN: [id number]  SAVANNAHA STONEROCK [address] [place], [place]  [address]   Dear Ms. Blye,   Our records indicate that you missed your scheduled appointment with _____lab________________ on __10.4.11__________.  Please contact this office to reschedule your appointment as soon as possible.  It is important that you keep your scheduled appointments with your physician, so we can provide you the best care possible.  Please be advised that there may be a charge for "no show" appointments.    Sincerely,   [place] [facility]

## 2010-06-22 NOTE — Assessment & Plan Note (Signed)
Summary: 4 MONTH FOLLOW UP/[facility]   Vital Signs:  Patient profile:   77 year old female Height:      68 inches Weight:      189.6 pounds BMI:     28.93 Temp:     98.2 degrees F oral Pulse rate:   80 / minute Pulse rhythm:   regular BP sitting:   140 / 78  (left arm) Cuff size:   regular  Vitals Entered By: Benny Lennert CMA [facility] Dull) (June 05, 2009 8:47 AM)  History of Present Illness: Chief complaint 4 month follow up  Dm, poor control..noncompliant with diet over holidays.   Acute Visit History:      The patient complains of nasal discharge and sore throat.  These symptoms began 1 week ago.  She denies chest pain, cough, earache, fever, headache, and sinus problems.  Other comments include: stopped smoking 4 weeks ago.        'Cold' or URI symptoms have been present with the sore throat.  There is no history of recent exposure to strep.         Hypertension History:      She denies headache, chest pain, dyspnea with exertion, peripheral edema, syncope, and side effects from treatment.  not checking at home .Marland Kitchennot taking OTC .        Positive major cardiovascular risk factors include female age 75 years old or older, diabetes, hyperlipidemia, hypertension, and current tobacco user.     Problems Prior to Update: 1)  Routine Gynecological Examination  (ICD-V72.31) 2)  Other Screening Mammogram  (ICD-V76.12) 3)  Sciatica, Acute  (ICD-724.3) 4)  Anemia Nos  (ICD-285.9) 5)  Tobacco Abuse  (ICD-305.1) 6)  Hx of Aspiration Pneumonia  (ICD-507.0) 7)  Hx of Diabetes Mellitus, With Hyperosmolarity  (ICD-250.20) 8)  Anemia in Chronic Kidney Disease  (ICD-285.21) 9)  Renal Disease, Chronic, Stage Iii Due To Nephrosclerosis  (ICD-585.3) 10)  Glaucoma Nos  (ICD-365.9) 11)  Osteoarthritis  (ICD-715.90) 12)  Hypertension  (ICD-401.9) 13)  Hyperlipidemia  (ICD-272.4) 14)  Diabetes Mellitus, Type II  (ICD-250.00)  Current Medications (verified): 1)  Amlodipine Besylate 10 Mg Tabs  (Amlodipine Besylate) .... Take 1 Tablet By Mouth Once A Day 2)  Geritol Complete .... Take 1 By Mouth Daily 3)  Altace 10 Mg Caps (Ramipril) .... Take 1 Tablet By Mouth Two Times A Day 4)  Nph Insulin .... Rely On  35 Units Qam  25 Units Qpm 5)  Toprol Xl 100 Mg Tb24 (Metoprolol Succinate) .... Take 1 Tablet By Mouth Once A Day 6)  Simvastatin 80 Mg  Tabs (Simvastatin) .... Take 1 Tablet By Mouth Once A Day 7)  Zetia 10 Mg Tabs (Ezetimibe) .... Take 1 Tablet By Mouth Once A Day 8)  Lancets  Misc (Lancets) .... Check Blood Sugar Daily 9)  Onetouch Ultra Test   Strp (Glucose Blood) .... Test Blood Sugar Once Daily 10)  Metformin Hcl 500 Mg Xr24h-Tab (Metformin Hcl) .... Take 1 Tablet By Mouth Once A Day 11)  Toprol Xl 50 Mg Xr24h-Tab (Metoprolol Succinate) .... Take 1 Tablet By Mouth Once A Day 12)  Furosemide 40 Mg Tabs (Furosemide) .... Take 1 Tablet By Mouth Once A Day  Allergies: 1)  ! * Insulin  Past History:  Past medical, surgical, family and social histories (including risk factors) reviewed, and no changes noted (except as noted below).  Past Medical History: Reviewed history from 08/10/2006 and no changes required. Diabetes mellitus, type II Hyperlipidemia  Hypertension Osteoarthritis hx/ hosp for HONK, aspiration PNA, and seizure  Past Surgical History: Reviewed history from 08/10/2006 and no changes required. Appendectomy Tonsillectomy 2004: cervical surgery for bone spurs 01/2006: renal US: B renal cysts  Family History: Reviewed history and no changes required.  Social History: Reviewed history and no changes required.  Review of Systems General:  Denies fatigue and fever. CV:  Denies swelling of feet. Resp:  Denies shortness of breath. GI:  Denies abdominal pain. GU:  Denies dysuria.  Physical Exam  General:  elderly female appears younger than stated age Head:  no maxillary sinus ttp Eyes:  No corneal or conjunctival inflammation noted. EOMI. Perrla.  Funduscopic exam benign, without hemorrhages, exudates or papilledema. Vision grossly normal. Ears:  clear B TMs.  Nose:  nasal discharge, no mucosal pallor.   Mouth:  Oral mucosa and oropharynx without lesions or exudates.  Teeth in good repair. Neck:  no carotid bruit or thyromegaly no cervical or supraclavicular lymphadenopathy  Lungs:  Normal respiratory effort, chest expands symmetrically. Lungs are clear to auscultation, no crackles or wheezes. Heart:  Normal rate and regular rhythm. S1 and S2 normal without gallop, murmur, click, rub or other extra sounds. Abdomen:  Bowel sounds positive,abdomen soft and non-tender without masses, organomegaly or hernias noted. Pulses:  R and L posterior tibial pulses are full and equal bilaterally  Extremities:  no edema  Diabetes Management Exam:    Foot Exam (with socks and/or shoes not present):       Sensory-Pinprick/Light touch:          Left medial foot (L-4): normal          Left dorsal foot (L-5): normal          Left lateral foot (S-1): normal          Right medial foot (L-4): normal          Right dorsal foot (L-5): normal          Right lateral foot (S-1): normal       Sensory-Monofilament:          Left foot: normal          Right foot: normal       Inspection:          Left foot: normal          Right foot: normal       Nails:          Left foot: normal          Right foot: normal   Impression & Recommendations:  Problem # 1:  HYPERTENSION (ICD-401.9) On multiple BP meds...needs better control for kidney protection...will follow at home and call with BP measurements. Her updated medication list for this problem includes:    Amlodipine Besylate 10 Mg Tabs (Amlodipine besylate) .Marland Kitchen... Take 1 tablet by mouth once a day    Altace 10 Mg Caps (Ramipril) .Marland Kitchen... Take 1 tablet by mouth two times a day    Toprol Xl 100 Mg Tb24 (Metoprolol succinate) .Marland Kitchen... Take 1 tablet by mouth once a day    Toprol Xl 50 Mg Xr24h-tab (Metoprolol  succinate) .Marland Kitchen... Take 1 tablet by mouth once a day    Furosemide 40 Mg Tabs (Furosemide) .Marland Kitchen... Take 1 tablet by mouth once a day  Problem # 2:  HYPERLIPIDEMIA (ICD-272.4) At goal on current meds.  Her updated medication list for this problem includes:    Simvastatin 80 Mg Tabs (Simvastatin) .Marland Kitchen... Take 1  tablet by mouth once a day    Zetia 10 Mg Tabs (Ezetimibe) .Marland Kitchen... Take 1 tablet by mouth once a day  Labs Reviewed: SGOT: 10 (06/01/2009)   SGPT: 16 (06/01/2009)  Lipid Goals: Chol Goal: 200 (09/25/2006)   HDL Goal: 40 (09/25/2006)   LDL Goal: 100 (09/25/2006)   TG Goal: 150 (09/25/2006)  10 Yr Risk Heart Disease: > 32 % Prior 10 Yr Risk Heart Disease: 24 % (03/12/2009)   HDL:36.90 (06/01/2009), 40.80 (10/28/2008)  LDL:84 (06/01/2009), 70 (10/28/2008)  Chol:138 (06/01/2009), 129 (10/28/2008)  Trig:86.0 (06/01/2009), 92.0 (10/28/2008)  Problem # 3:  DIABETES MELLITUS, TYPE II (ICD-250.00) Inadequate control on insulin.She is very noncompliant with diet.  Counseled on need to make change and complications of DM. pt resistant to med cahnges.  Her updated medication list for this problem includes:    Altace 10 Mg Caps (Ramipril) .Marland Kitchen... Take 1 tablet by mouth two times a day    Metformin Hcl 500 Mg Xr24h-tab (Metformin hcl) .Marland Kitchen... Take 1 tablet by mouth once a day  Problem # 4:  RENAL DISEASE, CHRONIC, STAGE III DUE TO NEPHROSCLEROSIS (ICD-585.3) stable, sees Dr. Lowell Guitar.  Problem # 5:  URI (ICD-465.9)  Instructed on symptomatic treatment. Call if symptoms persist or worsen.   Complete Medication List: 1)  Amlodipine Besylate 10 Mg Tabs (Amlodipine besylate) .... Take 1 tablet by mouth once a day 2)  Geritol Complete  .... Take 1 by mouth daily 3)  Altace 10 Mg Caps (Ramipril) .... Take 1 tablet by mouth two times a day 4)  Nph Insulin  .... Rely on  35 units qam  25 units qpm 5)  Toprol Xl 100 Mg Tb24 (Metoprolol succinate) .... Take 1 tablet by mouth once a day 6)  Simvastatin 80 Mg  Tabs (Simvastatin) .... Take 1 tablet by mouth once a day 7)  Zetia 10 Mg Tabs (Ezetimibe) .... Take 1 tablet by mouth once a day 8)  Lancets Misc (Lancets) .... Check blood sugar daily 9)  Onetouch Ultra Test Strp (Glucose blood) .... Test blood sugar once daily 10)  Metformin Hcl 500 Mg Xr24h-tab (Metformin hcl) .... Take 1 tablet by mouth once a day 11)  Toprol Xl 50 Mg Xr24h-tab (Metoprolol succinate) .... Take 1 tablet by mouth once a day 12)  Furosemide 40 Mg Tabs (Furosemide) .... Take 1 tablet by mouth once a day  Hypertension Assessment/Plan:      The patient's hypertensive risk group is category C: Target organ damage and/or diabetes.  Her calculated 10 year risk of coronary heart disease is > 32 %.  Today's blood pressure is 140/78.  Her blood pressure goal is < 130/80.  Patient Instructions: 1)  Follow BP at home..call with measurments after 1-2 weeks to determine if running above 130/80. 2)  See your eye doctor yearly to check for diabetic eye damage. 3)  Please schedule a follow-up appointment in 3 months .  4)  BMP prior to visit, ICD-9: 250.00 5)  HgBA1c prior to visit  ICD-9:  6)  Nasal saline irrigation, mucinex DM. Avoid decongestants.   Current Allergies (reviewed today): ! * INSULIN

## 2010-06-24 NOTE — Assessment & Plan Note (Signed)
Summary: ROA FOR 3 MONTH FOLLOW-UP/[facility]   Vital Signs:  Patient profile:   77 year old female Height:      68 inches Weight:      191.50 pounds BMI:     29.22 Temp:     97.5 degrees F oral Pulse rate:   78 / minute Pulse rhythm:   regular BP sitting:   130 / 62  (left arm) Cuff size:   regular  Vitals Entered By: Linde Gillis CMA [facility]) (June 04, 2010 8:36 AM) CC: 3 month follow up   History of Present Illness:  DM: poor control continued... despite increasing NPH 35/25 Units (say she is taking every day, but not able to tell dose right away.. she says she adjust herself sometimes. She never  added back Januvia. Checking blood sugar: daily  Hesitant to change to other insulin given itching SE in past.  Can only use relion [employer] brand.  HTN, improved control on 100 to 150 mg daily  from last OV.  Not checking BP at home..needs new machine.   High cholesterol: Inadequate control on simvastatin 80.  Pt very noncompliant with diet and lifestyle changes.   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 Take 1 Tablet By Mouth Once A Day 2)  Geritol Complete .... Take 1 By Mouth Daily 3)  Altace 10 Mg Caps (Ramipril) .... Take 1 Tablet By  Mouth Two Times A Day 4)  Nph Insulin .... Rely On  35 Units Qam  25 Units Qpm 5)  Metoprolol Succinate 200 Mg Xr24h-Tab (Metoprolol Succinate) .Marland Kitchen.. 1 Tab By Mouth Daily 6)  Simvastatin 80 Mg  Tabs (Simvastatin) .... Take 1 Tablet By Mouth Once A Day 7)  Zetia 10 Mg Tabs (Ezetimibe) .... Take 1 Tablet By Mouth Once A Day 8)  Lancets  Misc (Lancets) .... Check Blood Sugar Daily 9)  Onetouch Ultra Test   Strp (Glucose Blood) .... Test Blood Sugar Once Daily 10)  Furosemide 40 Mg Tabs (Furosemide) .... Take 1 Tablet By Mouth Once A Day 11)  Januvia 100 Mg Tabs (Sitagliptin Phosphate) .Marland Kitchen.. 1 Tab By Mouth Daily  Allergies: 1)  ! * Insulin  Past History:  Past medical, surgical, family and social histories (including risk factors) reviewed, and no changes noted (except as noted below).  Past Medical History: Reviewed history from 08/10/2006 and no changes required. Diabetes mellitus, type II Hyperlipidemia Hypertension Osteoarthritis hx/ hosp for HONK, aspiration PNA, and seizure  Past Surgical History: Reviewed history from 08/10/2006 and no changes required. Appendectomy Tonsillectomy 2004: cervical surgery for bone spurs 01/2006: renal US: B renal cysts  Family History: Reviewed history and no changes required.  Social History: Reviewed history and no changes required.  Review of Systems General:  Denies fatigue and fever.  CV:  Denies chest pain or discomfort. Resp:  Denies shortness of breath. GI:  Denies abdominal pain. GU:  Denies dysuria.  Physical Exam  General:  elderly female appears younger than stated age Mouth:  Oral mucosa and oropharynx without lesions or exudates.  Teeth in good repair. Neck:  no carotid bruit or thyromegaly no cervical or supraclavicular lymphadenopathy  Lungs:  Normal respiratory effort, chest expands symmetrically. Lungs are clear to auscultation, no crackles or wheezes. Heart:  Normal rate and regular rhythm. S1 and S2 normal without  gallop, murmur, click, rub or other extra sounds. Abdomen:  Bowel sounds positive,abdomen soft and non-tender without masses, organomegaly or hernias noted. Pulses:  R and L posterior tibial pulses are full and equal bilaterally  Extremities:  no edema  Diabetes Management Exam:    Foot Exam (with socks and/or shoes not present):       Sensory-Pinprick/Light touch:          Left medial foot (L-4): normal          Left dorsal foot (L-5): normal          Left lateral foot (S-1): normal          Right medial foot (L-4): normal          Right dorsal foot (L-5): normal          Right lateral foot (S-1): normal       Sensory-Monofilament:          Left foot: normal          Right foot: normal       Inspection:          Left foot: normal          Right foot: normal       Nails:          Left foot: normal          Right foot: normal   Impression & Recommendations:  Problem # 1:  DIABETES MELLITUS, TYPE II (ICD-250.00)  Noncompliant.. poor control.  Open to changing to 70/30 as long as it is relion [employer] brand.  Will cmake this cahge, but decrease dose a small amount to avoid hypoglycemia.  The following medications were removed from the medication list:    Januvia 100 Mg Tabs (Sitagliptin phosphate) .Marland Kitchen... 1 tab by mouth daily Her updated medication list for this problem includes:    Altace 10 Mg Caps (Ramipril) .Marland Kitchen... Take 1 tablet by mouth two times a day  Labs Reviewed: Creat: 1.7 (11/27/2009)     Last Eye Exam: no diabetic retinopathy, referred to retinal specialist for central retinal vein occlusion.  (08/05/2009) Reviewed HgBA1c results: 8.6 (05/31/2010)  8.6 (11/27/2009)  Problem # 2:  HYPERLIPIDEMIA (ICD-272.4)  Worsened control... change to lipitor generic 80 mg daily.  Her updated medication list for this problem includes:    Lipitor 80 Mg Tabs (Atorvastatin calcium) .Marland Kitchen... Generic  atorvastatin  take 1 tablet by mouth once a day    Zetia 10 Mg Tabs (Ezetimibe) .Marland Kitchen...  Take 1 tablet by mouth once a day  Labs Reviewed: SGOT: 9 (11/27/2009)   SGPT: 15 (11/27/2009)  Lipid Goals: Chol Goal: 200 (09/25/2006)   HDL Goal: 40 (09/25/2006)   LDL Goal: 100 (09/25/2006)   TG Goal: 150 (09/25/2006)  Prior 10 Yr Risk Heart Disease: > 32 % (06/05/2009)   HDL:44.60 (05/31/2010), 42.50 (11/27/2009)  LDL:133 (05/31/2010), 109 (11/27/2009)  Chol:198 (05/31/2010), 172 (11/27/2009)  Trig:103.0 (05/31/2010), 104.0 (11/27/2009)  Problem # 3:  HYPERTENSION (ICD-401.9) UNclear control.. never increased to 200 mg metoprolol last April... stay on 150 and follow BPs. Meds refilled.  Her updated medication list for this problem includes:    Amlodipine Besylate 10 Mg Tabs (Amlodipine besylate) .Marland Kitchen... Take 1 tablet by mouth once a day    Altace 10 Mg Caps (Ramipril) .Marland Kitchen... Take 1 tablet by mouth two times a day    Metoprolol Succinate 100 Mg Xr24h-tab (Metoprolol succinate) .Marland Kitchen... Take 1 tablet by mouth once a day with 50 mg tab as well.    Furosemide 40 Mg Tabs (Furosemide) .Marland Kitchen... Take 1 tablet by mouth once a day    Metoprolol Succinate 50 Mg Xr24h-tab (Metoprolol succinate) .Marland Kitchen... Take 1 tablet by mouth once a day  along with the 100 mg tab  Complete Medication List: 1)  Amlodipine Besylate 10 Mg Tabs (Amlodipine besylate) .... Take 1 tablet by mouth once a day 2)  Geritol Complete  .... Take 1 by mouth daily 3)  Altace 10 Mg Caps (Ramipril) .... Take 1 tablet by mouth two times a day 4)  70/30 Nph/regular Insulin Rely On [employer] Brand  .Marland Kitchen.. 30 units before breakfast and 20 units before dinner  give 30-60 minutes before a meal 5)  Metoprolol Succinate 100 Mg Xr24h-tab (Metoprolol succinate) .... Take 1 tablet by mouth once a day with 50 mg tab as well. 6)  Lipitor 80 Mg Tabs (Atorvastatin calcium) .... Generic  atorvastatin  take 1 tablet by mouth once a day 7)  Zetia 10 Mg Tabs (Ezetimibe) .... Take 1 tablet by mouth once a day 8)  Lancets Misc (Lancets) .... Oine touch delica  check  blood sugar  one to 2 times daily due to poor blood sugar control dx 250.01 9)  Onetouch Ultra Test Strp (Glucose blood) .... Test blood sugar once daily 10)  Furosemide 40 Mg Tabs (Furosemide) .... Take 1 tablet by mouth once a day 11)  Metoprolol Succinate 50 Mg Xr24h-tab (Metoprolol succinate) .... Take 1 tablet by mouth once a day  along with the 100 mg tab  Patient Instructions: 1)  Change simvastin to generic liptior 80 mg daily. 2)   Change NPH to 70/ 30... decrease to 30 Units in AM and 20 Units before dinner. Eat 30-60 min prior.  3)  Please schedule a follow-up appointment in 3 months  CPX. 4)  BMP prior to visit, ICD-9: 250.01 5)  Hepatic Panel prior to visit ICD-9:  6)  Lipid panel prior to visit ICD-9 :  7)  HgBA1c prior to visit  ICD-9:  Prescriptions: 70/30 NPH/REGULAR INSULIN RELY ON [facility] BRAND 30 units before breakfast and 20 units before dinner  Give 30-60 minutes before a meal  #1 box x 11   Entered and Authorized by:   Kerby Nora MD   Signed by:   Kerby Nora MD on 06/04/2010   Method used:   Print then Give to Patient   RxID:   [id number] LIPITOR 80 MG TABS (ATORVASTATIN CALCIUM) GENERIC  atorvastatin  Take 1 tablet by mouth once a day  #30 x 11   Entered and Authorized by:   Kerby Nora MD   Signed by:   Kerby Nora MD on 06/04/2010   Method used:   Electronically to        [facility] [address] (retail)       [address], [address]       [place]  [place], [place]  [address]       Ph: [phone]       Fax: [phone]   RxID:   [id number] LANCETS  MISC (LANCETS) OIne Touch Delica  Check blood sugar  one to 2 times daily due to poor blood sugar control Dx 250.01  #100 x 11   Entered and Authorized by:   Kerby Nora MD   Signed by:   Kerby Nora MD on 06/04/2010   Method used:   Electronically to        [facility] [address] (retail)       [address], [address]       [place], [place]   [address]       Ph: [phone]       Fax: [phone]   RxID:   [id number] AMLODIPINE BESYLATE 10 MG TABS (AMLODIPINE BESYLATE) Take 1 tablet by mouth once a day  #90 Each x 3   Entered and Authorized by:   Kerby Nora MD   Signed by:   Kerby Nora MD on 06/04/2010   Method used:   Electronically to        [facility] [address] (retail)       [address], [address]       [place], [place]  [address]       Ph: [phone]       Fax: [phone]   RxID:   [id number] METOPROLOL SUCCINATE 50 MG XR24H-TAB (METOPROLOL SUCCINATE) Take 1 tablet by mouth once a day  along with the 100 mg tab  #90 x 3   Entered and Authorized by:   Kerby Nora MD   Signed by:   Kerby Nora MD on 06/04/2010   Method used:   Electronically to        [facility] [address] (retail)       [address], [address]       [place], [place]  [address]       Ph: [phone]       Fax: [phone]   RxID:   [id number] METOPROLOL SUCCINATE 100 MG XR24H-TAB (METOPROLOL SUCCINATE) Take 1 tablet by mouth once a day with 50 mg tab as well.  #90 x 3   Entered and Authorized by:   Kerby Nora MD   Signed by:   Kerby Nora MD on 06/04/2010   Method used:   Electronically to        [facility] [address] (retail)       [address]       [place], [place]  [address]       Ph: [phone]       Fax: [phone]   RxID:   [id number]    Orders Added: 1)  Est. Patient Level IV [19509]    Current Allergies (reviewed today): ! * INSULIN

## 2010-07-21 ENCOUNTER — Encounter: Payer: Self-pay | Admitting: Family Medicine

## 2010-08-19 ENCOUNTER — Other Ambulatory Visit: Payer: Self-pay | Admitting: Family Medicine

## 2010-09-06 ENCOUNTER — Other Ambulatory Visit: Payer: Self-pay

## 2010-09-10 ENCOUNTER — Encounter: Payer: Self-pay | Admitting: Family Medicine

## 2010-10-06 ENCOUNTER — Other Ambulatory Visit: Payer: Self-pay | Admitting: Family Medicine

## 2010-10-06 DIAGNOSIS — E109 Type 1 diabetes mellitus without complications: Secondary | ICD-10-CM

## 2010-10-07 ENCOUNTER — Other Ambulatory Visit (INDEPENDENT_AMBULATORY_CARE_PROVIDER_SITE_OTHER): Payer: Medicare Other | Admitting: Family Medicine

## 2010-10-07 DIAGNOSIS — E109 Type 1 diabetes mellitus without complications: Secondary | ICD-10-CM

## 2010-10-07 LAB — BASIC METABOLIC PANEL
BUN: 25 mg/dL — ABNORMAL HIGH (ref 6–23)
GFR: 32.08 mL/min — ABNORMAL LOW (ref >60.00)
Potassium: 4.2 mEq/L (ref 3.5–5.1)
Sodium: 142 mEq/L (ref 135–145)

## 2010-10-07 LAB — LIPID PANEL
Cholesterol: 134 mg/dL (ref 0–200)
LDL Cholesterol: 77 mg/dL (ref 0–99)
VLDL: 17.6 mg/dL (ref 0.0–40.0)

## 2010-10-07 LAB — HEPATIC FUNCTION PANEL
AST: 11 U/L (ref 0–37)
Alkaline Phosphatase: 98 U/L (ref 39–117)
Total Bilirubin: 0.3 mg/dL (ref 0.3–1.2)

## 2010-10-08 NOTE — Op Note (Signed)
[place]  Patient:    Doris [name], Doris [name] Visit Number: [id number] MRN: [id number]          Service Type: SUR Location: [id number] Attending Physician:  Jacki Cones Dictated by:   Veverly Fells Ophelia Charter, M.D. Proc. Date: 02/21/01 Admit Date:  02/21/2001                             Operative Report  PREOPERATIVE DIAGNOSIS:  C4-5, C5-6 herniated nucleus pulposus with compression.  POSTOPERATIVE DIAGNOSIS:  C4-5, C5-6 herniated nucleus pulposus with compression.  PROCEDURE:  C4-5, C5-6 anterior cervical diskectomy and fusion, left iliac crest bone graft.  SURGEON:  Mark C. Ophelia Charter, M.D.  ASSISTANT:  Colon Flattery. Ollen Bowl, M.D.  SECOND ASSISTANT:  Zonia Kief, P.A.-C  ANESTHESIA:  General endotracheal.  ESTIMATED BLOOD LOSS:  200 mL.  DRAINS:  One Hemovac.  DESCRIPTION OF PROCEDURE:  After induction of general anesthesia, orotracheal intubation, prepping the neck and iliac crest, after head halter traction was applied, usual green towels were sterilely applied.  Sterile skin marker, Betadine via drape x 2.  Thyroid sheet with sterile Mayo stand at the head. Incision was made at the midline, extending to the left overlying the C5 vertebrae.  Platysma was divided in line with the skin fibers.  Blunt dissection medial to carotid artery and sheath was performed.  This was below the omohyoid, and large spur at C5-6 identified.  A short 25 needle placed, and cross table lateral x-ray confirmed that was at this appropriate C5-6 level.  Self-retaining retractor were placed right and left so it plates up and down.  Diskectomy was performed with the scalpel, pituitary, and Cloward curettes.  Several large chunks of disk were pulled out.  Operative microscope was brought in after drilling with the 12 mm Cloward drill, using the ______ guide to inferiorly offset it.  The operative microscope was brought in once the hole was progressed back to the  posterior cortex.  With the operative microscope, spurs were removed.  Bone was noted to be soft and chunks of disks were removed.  There was some old hard disk fragments that were a combination of fibrous and cartilaginous.  These chunks were teased out with ______, grasped with the micro pituitary, and removed.  There was extreme hypertrophy of the posterior longitudinal ligament.  This was taken down.  Chunks of disks were posterior to the C6 body.  These were gradually teased up into the field and then removed.  Gutters were stripped with curettes from 4-0 to 0, and then #1 for eggressive fluids and exposure of subchondral bone.  Once the dura was fully visualized and decompressed, a 14 mm plate was harvested from the left iliac crest, splitting the fascia.  This was sized based on depth cage measurements, bullet nosed over the tip, and packed with a hammer, and then impacted into place, flush with the anterior cortex.  Identical procedure was repeated at C4-5.  At this level, there was hypertrophic ligaments, no old ______ fragments.  There was some disk material superior and inferior behind the vertebral body that was retained by the hypertrophic ligament.  Once these fragments were removed, the posterior longitudinal ligament was completely opened, gutters were completely stripped, dura was decompressed, and after irrigation a second plug was taken posterior to the first through a separate split in the fascia.  This plug was 17 mm thick, and was trimmed down  based on depth cage measurements, and packed where it was flush with the cortex which left 1 or 2 mm of room anterior to the cord.  There was some free eggressive fluid on each side.  Hemovac was placed through a separate stab incision.  Platysma closed with 3-0 Vicryl, 4-0 Vicryl subcuticular skin closure, Benzoin, Marcaine, and Steri-Strips.  The iliac crest was closed with 0 Vicryl single suture in the anterior split,  posterior split did not have sufficient fascia posterior for closure, and this region ______ was used for reapproximation of the superficial fascia, subcutaneous tissue, and skin staples.  Marcaine was placed in the hip.  Postoperative dressing.  The patient was transferred to the recovery room in stable condition.  Instrument count and needle count was correct.  A soft cervical collar was applied. Dictated by:   Veverly Fells Ophelia Charter, M.D. Attending Physician:  Jacki Cones DD:  02/21/01 TD:  02/22/01 Job: [id number]

## 2010-10-11 ENCOUNTER — Other Ambulatory Visit (INDEPENDENT_AMBULATORY_CARE_PROVIDER_SITE_OTHER): Payer: Medicare Other | Admitting: Family Medicine

## 2010-10-11 DIAGNOSIS — N179 Acute kidney failure, unspecified: Secondary | ICD-10-CM

## 2010-10-11 LAB — BASIC METABOLIC PANEL
BUN: 27 mg/dL — ABNORMAL HIGH (ref 6–23)
CO2: 27 mEq/L (ref 19–32)
Calcium: 8.9 mg/dL (ref 8.4–10.5)
Chloride: 106 mEq/L (ref 96–112)
Creatinine, Ser: 1.5 mg/dL — ABNORMAL HIGH (ref 0.4–1.2)
GFR: 42.45 mL/min — ABNORMAL LOW (ref >60.00)
Glucose, Bld: 104 mg/dL — ABNORMAL HIGH (ref 70–99)
Potassium: 4 mEq/L (ref 3.5–5.1)
Sodium: 141 mEq/L (ref 135–145)

## 2010-10-15 ENCOUNTER — Ambulatory Visit (INDEPENDENT_AMBULATORY_CARE_PROVIDER_SITE_OTHER): Payer: Medicare Other | Admitting: Family Medicine

## 2010-10-15 ENCOUNTER — Encounter: Payer: Self-pay | Admitting: Family Medicine

## 2010-10-15 DIAGNOSIS — Z0000 Encounter for general adult medical examination without abnormal findings: Secondary | ICD-10-CM

## 2010-10-15 DIAGNOSIS — E119 Type 2 diabetes mellitus without complications: Secondary | ICD-10-CM

## 2010-10-15 DIAGNOSIS — Z1231 Encounter for screening mammogram for malignant neoplasm of breast: Secondary | ICD-10-CM

## 2010-10-15 DIAGNOSIS — I10 Essential (primary) hypertension: Secondary | ICD-10-CM

## 2010-10-15 DIAGNOSIS — E785 Hyperlipidemia, unspecified: Secondary | ICD-10-CM

## 2010-10-15 DIAGNOSIS — Z780 Asymptomatic menopausal state: Secondary | ICD-10-CM

## 2010-10-15 NOTE — Progress Notes (Signed)
Subjective:    Patient ID: Doris [name], female    DOB: [birth year], 77 y.o.   MRN: [id number]  HPI  I have personally reviewed the Medicare Annual Wellness questionnaire and have noted 1. The patient's medical and social history 2. Their use of alcohol, tobacco or illicit drugs 3. Their current medications and supplements 4. The patient's functional ability including ADL's, fall risks, home safety risks and hearing or visual             impairment. 5. Diet and physical activities 6. Evidence for depression or mood disorders The patients weight, height, BMI and visual acuity have been recorded in the chart I have made referrals, counseling and provided education to the patient based review of the above and I have provided the pt with a written personalized care plan for preventive services.  Hypertension: Well controlled on current meds.   Using medication without problems or lightheadedness:  Chest pain with exertion:None Edema: None She had rarely been using lasix prior Short of breath:None Average home BPs: not checking   Elevated Cholesterol: Improved control on lipitor 80mg   Using medications without problems: Muscle aches:  Other complaints:  Diabetes: Poor control on insulin.  Tried changing to 70/30  20 in AM and 30 in PM but  for 4 weeks had to go back to NPH.  Never restarted [place]. Intolerant of metformin. She has been working on diet aggressively for 1 month...changed to olive oil. Daughter recently diagnosed with DM. Has lost 4 lbs. Using medications without difficulties: Hypoglycemic episodes: 65 1-2 times a week, notes if she does not eat before taking insulin Hyperglycemic episodes:?  Feet problems: None Blood Sugars averaging: Not checking regularly, only chcecking if feeling bad. eye exam within last year:   Review of Systems  Constitutional: Negative for fever and fatigue.  HENT: Negative for ear pain.   Eyes: Negative for pain.  Respiratory:  Negative for chest tightness and shortness of breath.   Cardiovascular: Negative for chest pain, palpitations and leg swelling.  Gastrointestinal: Negative for abdominal pain.  Genitourinary: Negative for dysuria.       Objective:   Physical Exam  Constitutional: Vital signs are normal. She appears well-developed and well-nourished. She is cooperative.  Non-toxic appearance. She does not appear ill. No distress.  HENT:  Head: Normocephalic.  Right Ear: Hearing, tympanic membrane, external ear and ear canal normal.  Left Ear: Hearing, tympanic membrane, external ear and ear canal normal.  Nose: Nose normal.  Eyes: Conjunctivae, EOM and lids are normal. Pupils are equal, round, and reactive to light. No foreign bodies found.  Neck: Trachea normal and normal range of motion. Neck supple. Carotid bruit is not present. No mass and no thyromegaly present.  Cardiovascular: Normal rate, regular rhythm, S1 normal, S2 normal, normal heart sounds and intact distal pulses.  Exam reveals no gallop.   No murmur heard. Pulmonary/Chest: Effort normal and breath sounds normal. No respiratory distress. She has no wheezes. She has no rhonchi. She has no rales.  Abdominal: Soft. Normal appearance and bowel sounds are normal. She exhibits no distension, no fluid wave, no abdominal bruit and no mass. There is no hepatosplenomegaly. There is no tenderness. There is no rebound, no guarding and no CVA tenderness. No hernia.  Genitourinary: No breast swelling, tenderness, discharge or bleeding. Pelvic exam was performed with patient prone.  Lymphadenopathy:    She has no cervical adenopathy.    She has no axillary adenopathy.  Neurological: She is alert. She has  normal strength. No cranial nerve deficit or sensory deficit.  Skin: Skin is warm, dry and intact. No rash noted.  Psychiatric: Her speech is normal and behavior is normal. Judgment normal. Her mood appears not anxious. Cognition and memory are normal. She  does not exhibit a depressed mood.   Diabetic foot exam: Normal inspection No skin breakdown No calluses  Normal DP pulses Normal sensation to light tough and monofilament Nails normal         Assessment & Plan:  Annual Medicare Wellness: The patient's preventative maintenance and recommended screening tests for an annual wellness exam were reviewed in full today. Brought up to date unless services declined.  Counselled on the importance of diet, exercise, and its role in overall health and mortality. The patient's FH and SH was reviewed, including their home life, tobacco status, and drug and alcohol status.

## 2010-10-15 NOTE — Assessment & Plan Note (Signed)
Improved control on current meds and with diet change!

## 2010-10-15 NOTE — Assessment & Plan Note (Signed)
Well controlled. Continue current medication.  

## 2010-10-15 NOTE — Patient Instructions (Addendum)
Schedule lab check on Tuesday, non-fasting. Check blood sugar fasting in mornings and then prior to dinner. Call in 1 week with the measurements at home. Continue great work on weight loss and diet changes. Discuss living will and Health care power of attorney.

## 2010-10-15 NOTE — Assessment & Plan Note (Addendum)
Recent acute worsening. She had been getting minimal fluid intake, ? Dehydration.  Held lasix (turned out she was not really taking) and ACEI... Cr returned to baseline. Pt placed back on ACEI starting today. Due for creatinine repeat next week.

## 2010-10-15 NOTE — Assessment & Plan Note (Signed)
Poor control. No change on 70/30 vs NPH yet.  Has been working harder on diet. Lost 4 lbs. She will check CBGs and call with measurements in 1 week so I can adjust insulin further.

## 2010-10-19 ENCOUNTER — Other Ambulatory Visit (INDEPENDENT_AMBULATORY_CARE_PROVIDER_SITE_OTHER): Payer: Medicare Other | Admitting: Family Medicine

## 2010-10-19 ENCOUNTER — Other Ambulatory Visit: Payer: Medicare Other

## 2010-10-19 DIAGNOSIS — E119 Type 2 diabetes mellitus without complications: Secondary | ICD-10-CM

## 2010-10-19 DIAGNOSIS — E785 Hyperlipidemia, unspecified: Secondary | ICD-10-CM

## 2010-10-19 DIAGNOSIS — I10 Essential (primary) hypertension: Secondary | ICD-10-CM

## 2010-10-19 DIAGNOSIS — Z1289 Encounter for screening for malignant neoplasm of other sites: Secondary | ICD-10-CM

## 2010-10-19 LAB — BASIC METABOLIC PANEL
CO2: 28 mEq/L (ref 19–32)
Calcium: 8.8 mg/dL (ref 8.4–10.5)
Glucose, Bld: 125 mg/dL — ABNORMAL HIGH (ref 70–99)
Sodium: 141 mEq/L (ref 135–145)

## 2010-10-19 LAB — FECAL OCCULT BLOOD, IMMUNOCHEMICAL: Fecal Occult Bld: NEGATIVE

## 2010-11-18 ENCOUNTER — Other Ambulatory Visit: Payer: Self-pay | Admitting: Family Medicine

## 2010-11-22 ENCOUNTER — Ambulatory Visit: Payer: Medicare Other

## 2010-11-22 ENCOUNTER — Inpatient Hospital Stay: Admission: RE | Admit: 2010-11-22 | Payer: Medicare Other | Source: Ambulatory Visit

## 2011-01-11 ENCOUNTER — Other Ambulatory Visit (INDEPENDENT_AMBULATORY_CARE_PROVIDER_SITE_OTHER): Payer: Medicare Other

## 2011-01-11 DIAGNOSIS — E119 Type 2 diabetes mellitus without complications: Secondary | ICD-10-CM

## 2011-01-18 ENCOUNTER — Ambulatory Visit: Payer: Medicare Other | Admitting: Family Medicine

## 2011-01-20 ENCOUNTER — Ambulatory Visit (INDEPENDENT_AMBULATORY_CARE_PROVIDER_SITE_OTHER): Payer: Medicare Other | Admitting: Family Medicine

## 2011-01-20 ENCOUNTER — Encounter: Payer: Self-pay | Admitting: Family Medicine

## 2011-01-20 DIAGNOSIS — E785 Hyperlipidemia, unspecified: Secondary | ICD-10-CM

## 2011-01-20 DIAGNOSIS — E119 Type 2 diabetes mellitus without complications: Secondary | ICD-10-CM

## 2011-01-20 DIAGNOSIS — I10 Essential (primary) hypertension: Secondary | ICD-10-CM

## 2011-01-20 LAB — HM DIABETES FOOT EXAM

## 2011-01-20 NOTE — Assessment & Plan Note (Signed)
Re-eval next OV. 

## 2011-01-20 NOTE — Progress Notes (Signed)
  Subjective:    Patient ID: Doris [name], female    DOB: [birth year], 77 y.o.   MRN: [id number]  HPI  Hypertension: Well controlled on current meds.  Using medication without problems or lightheadedness: None Chest pain with exertion:None  Edema: Rarely.. treats with elevation, not lasix usually. Short of breath:None  Average home BPs: not checking   Elevated Cholesterol: Improved control on lipitor 80mg  at last check in 09/2010.  Diabetes: Slight improvement in last 3 months with A1C now on 70/30 30 Units in AM and 20 IN PM.  Never restarted januvia. Intolerant of metformin.  She has not been workoing on diet given she has been moving in the last month. Daughter recently diagnosed with DM.  Has lost 3 lbs.  Using medications without difficulties: None Hypoglycemic episodes: 65 2-3 times a week at 10-11 in AM before breakfast Hyperglycemic episodes: 144 Feet problems: None  Blood Sugars averaging: FBS 120-138 eye exam within last year:  Yes Does not regularly take a bedtime snack.      Review of Systems  Constitutional: Negative for fever and fatigue.  HENT: Negative for ear pain.   Eyes: Negative for pain.  Respiratory: Negative for chest tightness and shortness of breath.   Cardiovascular: Negative for chest pain, palpitations and leg swelling.  Gastrointestinal: Negative for abdominal pain.  Genitourinary: Negative for dysuria.       Objective:   Physical Exam  Constitutional: Vital signs are normal. She appears well-developed and well-nourished. She is cooperative.  Non-toxic appearance. She does not appear ill. No distress.  HENT:  Head: Normocephalic.  Right Ear: Hearing, tympanic membrane, external ear and ear canal normal. Tympanic membrane is not erythematous, not retracted and not bulging.  Left Ear: Hearing, tympanic membrane, external ear and ear canal normal. Tympanic membrane is not erythematous, not retracted and not bulging.  Nose: No mucosal edema or  rhinorrhea. Right sinus exhibits no maxillary sinus tenderness and no frontal sinus tenderness. Left sinus exhibits no maxillary sinus tenderness and no frontal sinus tenderness.  Mouth/Throat: Uvula is midline, oropharynx is clear and moist and mucous membranes are normal.  Eyes: Conjunctivae, EOM and lids are normal. Pupils are equal, round, and reactive to light. No foreign bodies found.  Neck: Trachea normal and normal range of motion. Neck supple. Carotid bruit is not present. No mass and no thyromegaly present.  Cardiovascular: Normal rate, regular rhythm, S1 normal, S2 normal, normal heart sounds, intact distal pulses and normal pulses.  Exam reveals no gallop and no friction rub.   No murmur heard. Pulmonary/Chest: Effort normal and breath sounds normal. Not tachypneic. No respiratory distress. She has no decreased breath sounds. She has no wheezes. She has no rhonchi. She has no rales.  Abdominal: Soft. Normal appearance and bowel sounds are normal. There is no tenderness.  Neurological: She is alert.  Skin: Skin is warm, dry and intact. No rash noted.  Psychiatric: Her speech is normal and behavior is normal. Judgment and thought content normal. Her mood appears not anxious. Cognition and memory are normal. She does not exhibit a depressed mood.  Diabetic foot exam: Normal inspection No skin breakdown No calluses  Normal DP pulses Normal sensation to light touch and monofilament Nails normal         Assessment & Plan:

## 2011-01-20 NOTE — Patient Instructions (Signed)
Make sure to take insulin with meals. Start healthy snack at bedtime. Work on healthy eating again, eat in as opposed to out. Work on [employer] exercise.

## 2011-01-20 NOTE — Assessment & Plan Note (Signed)
Improving control. Discussed ways to avoid lows in AMs.. Bedtime snack, taking insulin with meals not late at night. Continue currrent dose until lows resolved. Continue working on [employer] changes. Get back on track since move completed.

## 2011-01-20 NOTE — Assessment & Plan Note (Signed)
Well controlled. Continue current medication.  

## 2011-04-02 LAB — HM DIABETES EYE EXAM: HM Diabetic Eye Exam: ABNORMAL

## 2011-04-21 ENCOUNTER — Other Ambulatory Visit (INDEPENDENT_AMBULATORY_CARE_PROVIDER_SITE_OTHER): Payer: Medicare Other

## 2011-04-21 DIAGNOSIS — E119 Type 2 diabetes mellitus without complications: Secondary | ICD-10-CM

## 2011-04-21 DIAGNOSIS — E785 Hyperlipidemia, unspecified: Secondary | ICD-10-CM

## 2011-04-21 LAB — COMPREHENSIVE METABOLIC PANEL
ALT: 15 U/L (ref 0–35)
AST: 8 U/L (ref 0–37)
Alkaline Phosphatase: 115 U/L (ref 39–117)
CO2: 29 mEq/L (ref 19–32)
Creatinine, Ser: 1.8 mg/dL — ABNORMAL HIGH (ref 0.4–1.2)
GFR: 35.59 mL/min — ABNORMAL LOW (ref >60.00)
Sodium: 144 mEq/L (ref 135–145)
Total Bilirubin: 0.4 mg/dL (ref 0.3–1.2)
Total Protein: 7 g/dL (ref 6.0–8.3)

## 2011-04-21 LAB — LIPID PANEL
HDL: 42.5 mg/dL (ref >39.00)
LDL Cholesterol: 78 mg/dL (ref 0–99)
Total CHOL/HDL Ratio: 3
VLDL: 25.2 mg/dL (ref 0.0–40.0)

## 2011-04-21 LAB — HEMOGLOBIN A1C: Hgb A1c MFr Bld: 7.5 % — ABNORMAL HIGH (ref 4.6–6.5)

## 2011-04-28 ENCOUNTER — Ambulatory Visit: Payer: Medicare Other | Admitting: Family Medicine

## 2011-05-02 ENCOUNTER — Encounter: Payer: Self-pay | Admitting: Family Medicine

## 2011-05-02 ENCOUNTER — Ambulatory Visit (INDEPENDENT_AMBULATORY_CARE_PROVIDER_SITE_OTHER): Payer: Medicare Other | Admitting: Family Medicine

## 2011-05-02 DIAGNOSIS — E785 Hyperlipidemia, unspecified: Secondary | ICD-10-CM

## 2011-05-02 DIAGNOSIS — E119 Type 2 diabetes mellitus without complications: Secondary | ICD-10-CM

## 2011-05-02 DIAGNOSIS — N039 Chronic nephritic syndrome with unspecified morphologic changes: Secondary | ICD-10-CM

## 2011-05-02 DIAGNOSIS — N189 Chronic kidney disease, unspecified: Secondary | ICD-10-CM

## 2011-05-02 DIAGNOSIS — I10 Essential (primary) hypertension: Secondary | ICD-10-CM

## 2011-05-02 DIAGNOSIS — N183 Chronic kidney disease, stage 3 unspecified: Secondary | ICD-10-CM

## 2011-05-02 DIAGNOSIS — D631 Anemia in chronic kidney disease: Secondary | ICD-10-CM

## 2011-05-02 MED ORDER — INSULIN NPH ISOPHANE & REGULAR (70-30) 100 UNIT/ML ~~LOC~~ SUSP: SUBCUTANEOUS | Status: DC

## 2011-05-02 MED ORDER — RAMIPRIL 10 MG PO CAPS: 20.0000 mg | ORAL_CAPSULE | Freq: Every day | ORAL | Status: DC

## 2011-05-02 NOTE — Assessment & Plan Note (Signed)
Stable

## 2011-05-02 NOTE — Assessment & Plan Note (Signed)
Improveing control. No changes with med made. Encouraged pt to not hold insulin with normal CBGs. INstaed eat and can half if hesitant about lows. Discussed normal vs low numbers. Encouraged exercise, weight loss, healthy eating habits.

## 2011-05-02 NOTE — Assessment & Plan Note (Signed)
Due for re-eval. 

## 2011-05-02 NOTE — Patient Instructions (Signed)
Consider pneumonia vaccine.  Continue great work with diet and exercsie.  If AM blood sugars are in 100s... Consider decreasing insulin to 1/2 instead of skipping all together. MAke sure to take insulin with meals.  Consider healthy bedtime snack.

## 2011-05-02 NOTE — Progress Notes (Signed)
Subjective:    Patient ID: Doris [name], female    DOB: 1932-11-28, 77 y.o.   MRN: [id number]  HPI Hypertension: Elevated today but out of ramipril.  Using medication without problems or lightheadedness: None  Chest pain with exertion:None  Edema: Rarely.. treats with elevation, not lasix usually.  Short of breath:None  Average home BPs: not checking.. Does not have a machine.  Elevated Cholesterol: Good control on lipitor 80mg . Using medications without problems: None Muscle aches: none Diet compliance:moderate, watching portion size. Exercise:minimal Other complaints:   Diabetes:Continued improvement with a1C  Lab Results  Component Value Date   HGBA1C 7.5* 04/21/2011    on 70/30 30 Units in AM and 20 IN PM. Never restarted januvia. Intolerant of metformin.   Has gained weight back since last OV. Using medications without difficulties: None  Hypoglycemic episodes: None Hyperglycemic episodes: None Feet problems: None  Blood Sugars averaging: FBS 110-120, occ if FBS <100 she does not take insulin. eye exam within last year: Yes  Now taking a  bedtime snack occ.   CKD: stable range 1.8 -2.0 is baseline.  Followed in past by Dr. Lowell Guitar... Felt due to nephrosclerosis. She stopped seeing him because he was not changing anything.   Review of Systems  Constitutional: Negative for fever and fatigue.  HENT: Negative for ear pain.   Eyes: Negative for pain.  Respiratory: Negative for chest tightness and shortness of breath.   Cardiovascular: Negative for chest pain, palpitations and leg swelling.  Gastrointestinal: Negative for abdominal pain.  Genitourinary: Negative for dysuria.       Objective:   Physical Exam  Constitutional: Vital signs are normal. She appears well-developed and well-nourished. She is cooperative.  Non-toxic appearance. She does not appear ill. No distress.  HENT:  Head: Normocephalic.  Right Ear: Hearing, tympanic membrane, external ear and ear  canal normal. Tympanic membrane is not erythematous, not retracted and not bulging.  Left Ear: Hearing, tympanic membrane, external ear and ear canal normal. Tympanic membrane is not erythematous, not retracted and not bulging.  Nose: No mucosal edema or rhinorrhea. Right sinus exhibits no maxillary sinus tenderness and no frontal sinus tenderness. Left sinus exhibits no maxillary sinus tenderness and no frontal sinus tenderness.  Mouth/Throat: Uvula is midline, oropharynx is clear and moist and mucous membranes are normal.  Eyes: Conjunctivae, EOM and lids are normal. Pupils are equal, round, and reactive to light. No foreign bodies found.  Neck: Trachea normal and normal range of motion. Neck supple. Carotid bruit is not present. No mass and no thyromegaly present.  Cardiovascular: Normal rate, regular rhythm, S1 normal, S2 normal, normal heart sounds, intact distal pulses and normal pulses.  Exam reveals no gallop and no friction rub.   No murmur heard. Pulmonary/Chest: Effort normal and breath sounds normal. Not tachypneic. No respiratory distress. She has no decreased breath sounds. She has no wheezes. She has no rhonchi. She has no rales.  Abdominal: Soft. Normal appearance and bowel sounds are normal. There is no tenderness.  Neurological: She is alert.  Skin: Skin is warm, dry and intact. No rash noted.  Psychiatric: Her speech is normal and behavior is normal. Judgment and thought content normal. Her mood appears not anxious. Cognition and memory are normal. She does not exhibit a depressed mood.    Diabetic foot exam: Normal inspection No skin breakdown No calluses  Normal DP pulses Normal sensation to light touch and monofilament Nails normal      Assessment & Plan:

## 2011-05-02 NOTE — Assessment & Plan Note (Signed)
Controlled when taking medication. Refilled ramipril.

## 2011-07-16 ENCOUNTER — Other Ambulatory Visit: Payer: Self-pay | Admitting: Family Medicine

## 2011-07-19 ENCOUNTER — Other Ambulatory Visit: Payer: Self-pay | Admitting: Family Medicine

## 2011-07-20 ENCOUNTER — Other Ambulatory Visit: Payer: Self-pay | Admitting: Family Medicine

## 2011-08-24 ENCOUNTER — Encounter (HOSPITAL_COMMUNITY): Payer: Self-pay

## 2011-08-24 ENCOUNTER — Emergency Department (INDEPENDENT_AMBULATORY_CARE_PROVIDER_SITE_OTHER)
Admission: EM | Admit: 2011-08-24 | Discharge: 2011-08-24 | Disposition: A | Discharge status: Home / Self Care | Payer: Medicare Other | Source: Home / Self Care | Attending: Emergency Medicine | Admitting: Emergency Medicine

## 2011-08-24 DIAGNOSIS — M5430 Sciatica, unspecified side: Secondary | ICD-10-CM

## 2011-08-24 MED ORDER — METHOCARBAMOL 500 MG PO TABS: 500.0000 mg | ORAL_TABLET | Freq: Three times a day (TID) | ORAL | Status: AC

## 2011-08-24 MED ORDER — TRAMADOL HCL 50 MG PO TABS: 100.0000 mg | ORAL_TABLET | Freq: Three times a day (TID) | ORAL | Status: AC | PRN

## 2011-08-24 NOTE — Discharge Instructions (Signed)
Do exercises twice daily followed by moist heat for 15 minutes.  Try to be as active as possible.  If no better in 2 weeks, follow up with orthopedist.  Sciatica Sciatica is a weakness and/or changes in sensation (tingling, jolts, hot and cold, numbness) along the path the sciatic nerve travels. Irritation or damage to lumbar nerve roots is often also referred to as lumbar radiculopathy.  Lumbar radiculopathy (Sciatica) is the most common form of this problem. Radiculopathy can occur in any of the nerves coming out of the spinal cord. The problems caused depend on which nerves are involved. The sciatic nerve is the large nerve supplying the branches of nerves going from the hip to the toes. It often causes a numbness or weakness in the skin and/or muscles that the sciatic nerve serves. It also may cause symptoms (problems) of pain, burning, tingling, or electric shock-like feelings in the path of this nerve. This usually comes from injury to the fibers that make up the sciatic nerve. Some of these symptoms are low back pain and/or unpleasant feelings in the following areas:  From the mid-buttock down the back of the leg to the back of the knee.   And/or the outside of the calf and top of the foot.   And/or behind the inner ankle to the sole of the foot.  CAUSES   Herniated or slipped disc. Discs are the little cushions between the bones in the back.   Pressure by the piriformis muscle in the buttock on the sciatic nerve (Piriformis Syndrome).   Misalignment of the bones in the lower back and buttocks (Sacroiliac Joint Derangement).   Narrowing of the spinal canal that puts pressure on or pinches the fibers that make up the sciatic nerve.   A slipped vertebra that is out of line with those above or beneath it.   Abnormality of the nervous system itself so that nerve fibers do not transmit signals properly, especially to feet and calves (neuropathy).   Tumor (this is rare).  Your  caregiver can usually determine the cause of your sciatica and begin the treatment most likely to help you. TREATMENT  Taking over-the-counter painkillers, physical therapy, rest, exercise, spinal manipulation, and injections of anesthetics and/or steroids may be used. Surgery, acupuncture, and Yoga can also be effective. Mind over matter techniques, mental imagery, and changing factors such as your bed, chair, desk height, posture, and activities are other treatments that may be helpful. You and your caregiver can help determine what is best for you. With proper diagnosis, the cause of most sciatica can be identified and removed. Communication and cooperation between your caregiver and you is essential. If you are not successful immediately, do not be discouraged. With time, a proper treatment can be found that will make you comfortable. HOME CARE INSTRUCTIONS   If the pain is coming from a problem in the back, applying ice to that area for 15 to 20 minutes, 3 to 4 times per day while awake, may be helpful. Put the ice in a plastic bag. Place a towel between the bag of ice and your skin.   You may exercise or perform your usual activities if these do not aggravate your pain, or as suggested by your caregiver.   Only take over-the-counter or prescription medicines for pain, discomfort, or fever as directed by your caregiver.   If your caregiver has given you a follow-up appointment, it is very important to keep that appointment. Not keeping the appointment could result   in a chronic or permanent injury, pain, and disability. If there is any problem keeping the appointment, you must call back to this facility for assistance.  SEEK IMMEDIATE MEDICAL CARE IF:   You experience loss of control of bowel or bladder.   You have increasing weakness in the trunk, buttocks, or legs.   There is numbness in any areas from the hip down to the toes.   You have difficulty walking or keeping your balance.   You  have any of the above, with fever or forceful vomiting.  Document Released: 05/03/2001 Document Revised: 04/28/2011 Document Reviewed: 12/21/2007 [employer] Patient Information 2012 [employer]. 

## 2011-08-24 NOTE — ED Notes (Addendum)
C/o pain in lower back w radiation in to right leg; has been sleeping on daughter's couch for past 2 nights; denies trauma, denies incontinance, denies anesthesia
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
## 2011-09-02 ENCOUNTER — Other Ambulatory Visit: Payer: Self-pay | Admitting: Sports Medicine

## 2011-09-02 DIAGNOSIS — M545 Low back pain: Secondary | ICD-10-CM

## 2011-09-04 ENCOUNTER — Other Ambulatory Visit: Payer: Self-pay | Admitting: Family Medicine

## 2011-09-05 ENCOUNTER — Other Ambulatory Visit: Payer: Self-pay | Admitting: Family Medicine

## 2011-09-10 ENCOUNTER — Ambulatory Visit
Admission: RE | Admit: 2011-09-10 | Discharge: 2011-09-10 | Disposition: A | Discharge status: Home / Self Care | Payer: Medicare Other | Source: Ambulatory Visit | Attending: Sports Medicine | Admitting: Sports Medicine

## 2011-09-10 DIAGNOSIS — M545 Low back pain: Secondary | ICD-10-CM

## 2011-09-11 ENCOUNTER — Other Ambulatory Visit: Payer: Medicare Other

## 2011-10-24 ENCOUNTER — Telehealth: Payer: Self-pay | Admitting: Family Medicine

## 2011-10-24 NOTE — Telephone Encounter (Signed)
Dr.Bassett called and asked for you to call her back.  I let her know you won't be in the office until tomorrow.  Dr.Bassett will be faxing over lab work and said patient is in renal failure and she'd like to discuss it with you.  The lab report is on your desk.  Please call Dr.Bassett @ [phone].

## 2011-10-24 NOTE — Telephone Encounter (Signed)
Patient notified as instructed by telephone v/m per Dr Cyndie Chime request.

## 2011-10-24 NOTE — Telephone Encounter (Signed)
Call the patient and have her stop her ramipril please  Dr. Leonard Schwartz will follow up with further instructions  Kidneys have worsened a little bit on some labs that Dr. Cleophas Dunker drew

## 2011-10-25 ENCOUNTER — Encounter: Payer: Self-pay | Admitting: Family Medicine

## 2011-10-25 ENCOUNTER — Ambulatory Visit (INDEPENDENT_AMBULATORY_CARE_PROVIDER_SITE_OTHER): Payer: Medicare Other | Admitting: Family Medicine

## 2011-10-25 VITALS — BP 158/82 | HR 84 | Temp 98.6°F | Wt 190.0 lb

## 2011-10-25 DIAGNOSIS — N179 Acute kidney failure, unspecified: Secondary | ICD-10-CM

## 2011-10-25 DIAGNOSIS — M5430 Sciatica, unspecified side: Secondary | ICD-10-CM | POA: Insufficient documentation

## 2011-10-25 DIAGNOSIS — I10 Essential (primary) hypertension: Secondary | ICD-10-CM

## 2011-10-25 DIAGNOSIS — E119 Type 2 diabetes mellitus without complications: Secondary | ICD-10-CM

## 2011-10-25 NOTE — Assessment & Plan Note (Signed)
Poor control, will likely get worse off ramipril. Will like need to restart if cr better or change to different med.

## 2011-10-25 NOTE — Telephone Encounter (Signed)
Left message for patient to return my call.

## 2011-10-25 NOTE — Assessment & Plan Note (Signed)
Due for re-eval. 

## 2011-10-25 NOTE — Progress Notes (Signed)
  Subjective:    Patient ID: Doris [name], female    DOB: January 27, 1933, 78 y.o.   MRN: [id number]  HPI 78 year old female who has not been seen here in 6 months, was called to come in for re-eval given recent increase in creatinine noted at Dr. Hoy Register office. She has history of CKD stage 3 evaluated by Dr. Lowell Guitar in past and felt to be due to nephrosclerosis. Cr previously 1.8-2 .Marland Kitchen Now 2.65 She had been seen at ER 4/3 for sciatica and had been given pain meds... Tramadol and robaxin. No N/V, no diarrhea. May have some dehydration, not drinking as much water. She had stopped this 2 weeks ago and was changed to meloxicam by Dr. Cleophas Dunker.  She was told over the phone on 6/3 to stop meloxicam, ramipril and ca/vit D.  She is 6 months overdue on chol, DM screen. She had been seeing Dr. Cleophas Dunker for  back pain, right hip and leg pain. Had MRI and cortisone injection in back, helped some. Pain is improved today, some when walking.   Review of Systems  Constitutional: Negative for fever, fatigue and unexpected weight change.  HENT: Negative for ear pain, congestion, sore throat, sneezing, trouble swallowing and sinus pressure.   Eyes: Negative for pain and itching.  Respiratory: Negative for cough, chest tightness, shortness of breath and wheezing.   Cardiovascular: Negative for chest pain, palpitations and leg swelling.  Gastrointestinal: Negative for nausea, abdominal pain, diarrhea, constipation and blood in stool.  Genitourinary: Negative for dysuria, hematuria, vaginal discharge, difficulty urinating and menstrual problem.       Objective:   Physical Exam  Constitutional: Vital signs are normal. She appears well-developed and well-nourished. She is cooperative.  Non-toxic appearance. She does not appear ill. No distress.  HENT:  Head: Normocephalic.  Right Ear: Hearing, tympanic membrane, external ear and ear canal normal. Tympanic membrane is not erythematous, not retracted and not  bulging.  Left Ear: Hearing, tympanic membrane, external ear and ear canal normal. Tympanic membrane is not erythematous, not retracted and not bulging.  Nose: No mucosal edema or rhinorrhea. Right sinus exhibits no maxillary sinus tenderness and no frontal sinus tenderness. Left sinus exhibits no maxillary sinus tenderness and no frontal sinus tenderness.  Mouth/Throat: Uvula is midline, oropharynx is clear and moist and mucous membranes are normal.  Eyes: Conjunctivae, EOM and lids are normal. Pupils are equal, round, and reactive to light. No foreign bodies found.  Neck: Trachea normal and normal range of motion. Neck supple. Carotid bruit is not present. No mass and no thyromegaly present.  Cardiovascular: Normal rate, regular rhythm, S1 normal, S2 normal, normal heart sounds, intact distal pulses and normal pulses.  Exam reveals no gallop and no friction rub.   No murmur heard. Pulmonary/Chest: Effort normal and breath sounds normal. Not tachypneic. No respiratory distress. She has no decreased breath sounds. She has no wheezes. She has no rhonchi. She has no rales.  Abdominal: Soft. Normal appearance and bowel sounds are normal. There is no tenderness.  Neurological: She is alert.  Skin: Skin is warm, dry and intact. No rash noted.  Psychiatric: Her speech is normal and behavior is normal. Judgment and thought content normal. Her mood appears not anxious. Cognition and memory are normal. She does not exhibit a depressed mood.          Assessment & Plan:

## 2011-10-25 NOTE — Patient Instructions (Addendum)
Return for fasting labs on Friday. Cancel labs on 6/17, we are doing these early. Stop all ibuprofen, aleve, stay off tramadol, meloxicsam, ramipril. We will call you with the lab results on Friday to tell you what to do with you BP.

## 2011-10-25 NOTE — Assessment & Plan Note (Signed)
Use tyelnol for pain. Well controlled at this time. Not interested in surgical referral as suggested by Dr. Cleophas Dunker.

## 2011-10-25 NOTE — Telephone Encounter (Signed)
Patient has appt scheduled for 2:45 pm this after noon

## 2011-10-25 NOTE — Telephone Encounter (Signed)
Acute on  CKD 3.  Cr now 2.65. Saw Dr. Cleophas Dunker. She stooped meloxicam and vit D, Ca. 1.8 -2.0 is baseline.  Followed in past by Dr. Lowell Guitar... Felt  CKD due to nephrosclerosis. She stopped seeing him because he was not changing anything.  Have pt make appt today, Thurs or Fri to be seen  for further eval.

## 2011-10-25 NOTE — Assessment & Plan Note (Signed)
Likely due to OTC med, tramadol, meloxicam, mild dehydration.  Will re-eval on Friday with labs.  If not improving will refer back to nephrology.

## 2011-10-28 ENCOUNTER — Other Ambulatory Visit (INDEPENDENT_AMBULATORY_CARE_PROVIDER_SITE_OTHER): Payer: Medicare Other

## 2011-10-28 DIAGNOSIS — E785 Hyperlipidemia, unspecified: Secondary | ICD-10-CM

## 2011-10-28 DIAGNOSIS — E119 Type 2 diabetes mellitus without complications: Secondary | ICD-10-CM

## 2011-10-28 LAB — COMPREHENSIVE METABOLIC PANEL
AST: 7 U/L (ref 0–37)
Alkaline Phosphatase: 88 U/L (ref 39–117)
BUN: 33 mg/dL — ABNORMAL HIGH (ref 6–23)
Glucose, Bld: 118 mg/dL — ABNORMAL HIGH (ref 70–99)
Potassium: 3.9 mEq/L (ref 3.5–5.1)
Sodium: 142 mEq/L (ref 135–145)
Total Bilirubin: 0.4 mg/dL (ref 0.3–1.2)
Total Protein: 7.4 g/dL (ref 6.0–8.3)

## 2011-10-28 LAB — CBC WITH DIFFERENTIAL/PLATELET
Basophils Relative: 0.5 % (ref 0.0–3.0)
Eosinophils Absolute: 0.2 10*3/uL (ref 0.0–0.7)
Eosinophils Relative: 1.8 % (ref 0.0–5.0)
HCT: 36.2 % (ref 36.0–46.0)
Lymphs Abs: 2.5 10*3/uL (ref 0.7–4.0)
MCHC: 31.6 g/dL (ref 30.0–36.0)
MCV: 78.2 fl (ref 78.0–100.0)
Monocytes Absolute: 0.7 10*3/uL (ref 0.1–1.0)
RBC: 4.63 Mil/uL (ref 3.87–5.11)
WBC: 10.5 10*3/uL (ref 4.5–10.5)

## 2011-10-28 LAB — LIPID PANEL
HDL: 48.9 mg/dL (ref >39.00)
LDL Cholesterol: 75 mg/dL (ref 0–99)
VLDL: 27 mg/dL (ref 0.0–40.0)

## 2011-10-29 LAB — VITAMIN D 25 HYDROXY (VIT D DEFICIENCY, FRACTURES): Vit D, 25-Hydroxy: 21 ng/mL — ABNORMAL LOW (ref 30–89)

## 2011-11-01 ENCOUNTER — Telehealth: Payer: Self-pay | Admitting: Family Medicine

## 2011-11-01 MED ORDER — CHOLECALCIFEROL 25 MCG (1000 UT) PO TABS: 1000.0000 [IU] | ORAL_TABLET | Freq: Every day | ORAL | Status: AC

## 2011-11-01 NOTE — Telephone Encounter (Signed)
Will advise when patient call back about labs

## 2011-11-01 NOTE — Telephone Encounter (Signed)
Notify pt that her vit D is low. We need to supplement her.

## 2011-11-03 ENCOUNTER — Encounter: Payer: Self-pay | Admitting: Family Medicine

## 2011-11-03 ENCOUNTER — Telehealth: Payer: Self-pay | Admitting: Family Medicine

## 2011-11-07 ENCOUNTER — Other Ambulatory Visit: Payer: Medicare Other

## 2011-11-10 NOTE — Telephone Encounter (Signed)
Letter dated 11/03/2011 from Dr. Ermalene Searing sent by Certified Mail. Will update upon delivery of signed receipt. rmf

## 2011-11-11 ENCOUNTER — Ambulatory Visit (INDEPENDENT_AMBULATORY_CARE_PROVIDER_SITE_OTHER): Payer: Medicare Other | Admitting: Family Medicine

## 2011-11-11 ENCOUNTER — Encounter: Payer: Self-pay | Admitting: Family Medicine

## 2011-11-11 VITALS — BP 130/74 | HR 77 | Temp 98.6°F | Ht 68.0 in | Wt 196.8 lb

## 2011-11-11 DIAGNOSIS — I10 Essential (primary) hypertension: Secondary | ICD-10-CM

## 2011-11-11 DIAGNOSIS — E785 Hyperlipidemia, unspecified: Secondary | ICD-10-CM

## 2011-11-11 DIAGNOSIS — E559 Vitamin D deficiency, unspecified: Secondary | ICD-10-CM

## 2011-11-11 DIAGNOSIS — N179 Acute kidney failure, unspecified: Secondary | ICD-10-CM

## 2011-11-11 DIAGNOSIS — E119 Type 2 diabetes mellitus without complications: Secondary | ICD-10-CM

## 2011-11-11 MED ORDER — CHOLECALCIFEROL 25 MCG (1000 UT) PO TABS: 1000.0000 [IU] | ORAL_TABLET | Freq: Every day | ORAL | Status: DC

## 2011-11-11 NOTE — Patient Instructions (Addendum)
Start vitamin D supplement daily. Avoid ibuprofen.. Use tylenol instead. Please call when ready for kidney referral. Restart ramipril  ONE tablet ONCE daily. Return for kidney check in 1 week. Check blood sugars fasting and later in day, given [place] a call with measurments in 1 week.

## 2011-11-11 NOTE — Progress Notes (Signed)
  Subjective:    Patient ID: Doris [name], female    DOB: December 08, 1932, 78 y.o.   MRN: [id number]  HPI  Diabetes:  Inadequate control on 70/30 30 in AM and 20 Units at night.  Occ forgets to take but rarely. Lab Results  Component Value Date   HGBA1C 7.5* 10/28/2011  Using medications without difficulties: Hypoglycemic episodes:? Hyperglycemic episodes:? Feet problems:None Blood Sugars averaging:not checking in last 2-3 weeks. eye exam within last year:  Hypertension:  BP remains at goal off ramipril, but has no kidney protection.  Had been on ramipril for a while. On metoprolol.  Using medication without problems or lightheadedness:  Chest pain with exertion:None Edema:slight Short of breath:None Average home BPs:not checking. Other issues:  Elevated Cholesterol:  LDL at goal on lipitor and zetia Lab Results  Component Value Date   CHOL 151 10/28/2011   HDL 48.90 10/28/2011   LDLCALC 75 10/28/2011   TRIG 135.0 10/28/2011   CHOLHDL 3 10/28/2011    Using medications without problems:None Muscle aches: None Diet compliance:fair Exercise:NOne Other complaints:  Acute on chronic renal failure III: Improved cr from 2.65, but still high.  Vit D def Wt Readings from Last 3 Encounters:  11/11/11 196 lb 12 oz (89.245 kg)  10/25/11 190 lb (86.183 kg)  05/02/11 190 lb (86.183 kg)       Review of Systems  Constitutional: Negative for fever and fatigue.  HENT: Negative for ear pain.   Eyes: Negative for pain.  Respiratory: Negative for chest tightness and shortness of breath.   Cardiovascular: Negative for chest pain, palpitations and leg swelling.  Gastrointestinal: Negative for abdominal pain.  Genitourinary: Negative for dysuria.       Objective:   Physical Exam  Constitutional: Vital signs are normal. She appears well-developed and well-nourished. She is cooperative.  Non-toxic appearance. She does not appear ill. No distress.  HENT:  Head: Normocephalic.  Right Ear:  Hearing, tympanic membrane, external ear and ear canal normal. Tympanic membrane is not erythematous, not retracted and not bulging.  Left Ear: Hearing, tympanic membrane, external ear and ear canal normal. Tympanic membrane is not erythematous, not retracted and not bulging.  Nose: No mucosal edema or rhinorrhea. Right sinus exhibits no maxillary sinus tenderness and no frontal sinus tenderness. Left sinus exhibits no maxillary sinus tenderness and no frontal sinus tenderness.  Mouth/Throat: Uvula is midline, oropharynx is clear and moist and mucous membranes are normal.  Eyes: Conjunctivae, EOM and lids are normal. Pupils are equal, round, and reactive to light. No foreign bodies found.  Neck: Trachea normal and normal range of motion. Neck supple. Carotid bruit is not present. No mass and no thyromegaly present.  Cardiovascular: Normal rate, regular rhythm, S1 normal, S2 normal, normal heart sounds, intact distal pulses and normal pulses.  Exam reveals no gallop and no friction rub.   No murmur heard. Pulmonary/Chest: Effort normal and breath sounds normal. Not tachypneic. No respiratory distress. She has no decreased breath sounds. She has no wheezes. She has no rhonchi. She has no rales.  Abdominal: Soft. Normal appearance and bowel sounds are normal. There is no tenderness.  Neurological: She is alert.  Skin: Skin is warm, dry and intact. No rash noted.  Psychiatric: Her speech is normal and behavior is normal. Judgment and thought content normal. Her mood appears not anxious. Cognition and memory are normal. She does not exhibit a depressed mood.          Assessment & Plan:

## 2011-11-14 NOTE — Telephone Encounter (Signed)
Received signed receipt verifying delivery of certified letter. rmf 

## 2011-11-15 NOTE — Telephone Encounter (Signed)
Letter received and signed for per documentation.

## 2011-11-15 NOTE — Telephone Encounter (Signed)
Why was this so delayed ahould have gone out on 6/13... Pt was already seen on 6/21.

## 2011-11-18 ENCOUNTER — Other Ambulatory Visit (INDEPENDENT_AMBULATORY_CARE_PROVIDER_SITE_OTHER): Payer: Medicare Other

## 2011-11-18 DIAGNOSIS — N179 Acute kidney failure, unspecified: Secondary | ICD-10-CM

## 2011-11-18 LAB — BASIC METABOLIC PANEL
BUN: 25 mg/dL — ABNORMAL HIGH (ref 6–23)
CO2: 25 mEq/L (ref 19–32)
Calcium: 7.1 mg/dL — ABNORMAL LOW (ref 8.4–10.5)
Creatinine, Ser: 1.9 mg/dL — ABNORMAL HIGH (ref 0.4–1.2)
Glucose, Bld: 62 mg/dL — ABNORMAL LOW (ref 70–99)

## 2011-11-23 NOTE — Assessment & Plan Note (Signed)
Recommend pt returning to see Dr. Lowell Guitar to follow CKD.

## 2011-11-23 NOTE — Assessment & Plan Note (Addendum)
Inadequate control due to lifestyle and noncompliance with meds. Follow CBGs and call with measurements so that I can adjust insulin 70/30.

## 2011-11-23 NOTE — Assessment & Plan Note (Signed)
Restart lisinopril 

## 2011-11-23 NOTE — Assessment & Plan Note (Signed)
Replete

## 2011-11-23 NOTE — Assessment & Plan Note (Signed)
Improved with stopping tramadol and meloxicam. Advised pt to avoid NSADIS given kidney funvtion. Recheck creatinine in 1 week.  Restart ACEI as will be overall kidney protective.

## 2011-11-29 NOTE — Telephone Encounter (Signed)
Was unaware that letter was time-sensitive; however, letter was sent to medical records to be sent as certified mail per standard procedures immediately upon receipt.  Will discuss process with medical records to inquire and if it is correspondence requiring timely deliver in the future it will need to be mailed from site.

## 2012-02-17 ENCOUNTER — Encounter: Payer: Self-pay | Admitting: Family Medicine

## 2012-02-17 ENCOUNTER — Ambulatory Visit (INDEPENDENT_AMBULATORY_CARE_PROVIDER_SITE_OTHER): Payer: Medicare Other | Admitting: Family Medicine

## 2012-02-17 ENCOUNTER — Ambulatory Visit: Payer: Medicare Other | Admitting: Family Medicine

## 2012-02-17 VITALS — BP 140/76 | HR 83 | Temp 98.2°F | Ht 67.0 in | Wt 192.0 lb

## 2012-02-17 DIAGNOSIS — I10 Essential (primary) hypertension: Secondary | ICD-10-CM

## 2012-02-17 DIAGNOSIS — E785 Hyperlipidemia, unspecified: Secondary | ICD-10-CM

## 2012-02-17 DIAGNOSIS — E119 Type 2 diabetes mellitus without complications: Secondary | ICD-10-CM

## 2012-02-17 MED ORDER — HYDROCHLOROTHIAZIDE 25 MG PO TABS: 25.0000 mg | ORAL_TABLET | Freq: Every day | ORAL | Status: DC

## 2012-02-17 NOTE — Assessment & Plan Note (Signed)
Borderline control and peripheral swelling. Start HCTZ 25 mg daily.
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
## 2012-02-17 NOTE — Assessment & Plan Note (Signed)
At goal at last check. 

## 2012-02-17 NOTE — Assessment & Plan Note (Signed)
Re-eval. 

## 2012-02-17 NOTE — Assessment & Plan Note (Signed)
?   Control. Likely better then last check. On 70/30. Eating better overall.

## 2012-02-17 NOTE — Patient Instructions (Addendum)
Stop by the lab, we will call with lab resutls.  Continue all meds. Add HCTZ for blood pressure and fluid control.

## 2012-02-20 ENCOUNTER — Other Ambulatory Visit (INDEPENDENT_AMBULATORY_CARE_PROVIDER_SITE_OTHER): Payer: Medicare Other

## 2012-02-21 LAB — COMPREHENSIVE METABOLIC PANEL
AST: 8 U/L (ref 0–37)
Albumin: 3.9 g/dL (ref 3.5–5.2)
BUN: 38 mg/dL — ABNORMAL HIGH (ref 6–23)
CO2: 24 mEq/L (ref 19–32)
Calcium: 8.9 mg/dL (ref 8.4–10.5)
Chloride: 107 mEq/L (ref 96–112)
Glucose, Bld: 132 mg/dL — ABNORMAL HIGH (ref 70–99)
Potassium: 4.3 mEq/L (ref 3.5–5.3)

## 2012-02-29 ENCOUNTER — Other Ambulatory Visit (INDEPENDENT_AMBULATORY_CARE_PROVIDER_SITE_OTHER): Payer: Medicare Other

## 2012-02-29 DIAGNOSIS — R944 Abnormal results of kidney function studies: Secondary | ICD-10-CM

## 2012-02-29 LAB — RENAL FUNCTION PANEL
BUN: 41 mg/dL — ABNORMAL HIGH (ref 6–23)
Chloride: 106 mEq/L (ref 96–112)
GFR: 23.67 mL/min — ABNORMAL LOW (ref >60.00)
Phosphorus: 3.9 mg/dL (ref 2.3–4.6)
Potassium: 3.8 mEq/L (ref 3.5–5.1)
Sodium: 140 mEq/L (ref 135–145)

## 2012-03-05 ENCOUNTER — Other Ambulatory Visit (INDEPENDENT_AMBULATORY_CARE_PROVIDER_SITE_OTHER): Payer: Medicare Other

## 2012-03-05 ENCOUNTER — Telehealth: Payer: Self-pay

## 2012-03-05 DIAGNOSIS — N189 Chronic kidney disease, unspecified: Secondary | ICD-10-CM

## 2012-03-05 LAB — BASIC METABOLIC PANEL
Calcium: 8.4 mg/dL (ref 8.4–10.5)
Chloride: 107 mEq/L (ref 96–112)
Creatinine, Ser: 2.6 mg/dL — ABNORMAL HIGH (ref 0.4–1.2)

## 2012-03-05 NOTE — Telephone Encounter (Signed)
Per 03/01/12 result note; pt's OTC med is [facility] Arthritis pain relief containing acetaminophen 650 mg and pt usually takes two pills daily. Pt is presently not taking med.Please advise.

## 2012-03-06 NOTE — Telephone Encounter (Signed)
Left message on answering machine to call back.

## 2012-03-06 NOTE — Telephone Encounter (Signed)
Okay to use.. Not causing renal issues.

## 2012-03-06 NOTE — Telephone Encounter (Signed)
Advised patient

## 2012-05-04 LAB — BASIC METABOLIC PANEL
BUN: 43 mg/dL — AB (ref 4–21)
Glucose: 167 mg/dL
Sodium: 142 mmol/L (ref 137–147)

## 2012-05-04 LAB — HEPATIC FUNCTION PANEL: Bilirubin, Direct: 0.4 mg/dL (ref 0.01–0.4)

## 2012-05-18 ENCOUNTER — Encounter: Payer: Self-pay | Admitting: Family Medicine

## 2012-05-22 ENCOUNTER — Ambulatory Visit: Payer: Medicare Other | Admitting: Family Medicine

## 2012-05-24 ENCOUNTER — Ambulatory Visit (INDEPENDENT_AMBULATORY_CARE_PROVIDER_SITE_OTHER): Payer: Medicare Other | Admitting: Family Medicine

## 2012-05-24 ENCOUNTER — Encounter: Payer: Self-pay | Admitting: Family Medicine

## 2012-05-24 VITALS — BP 140/82 | HR 77 | Temp 98.3°F | Ht 67.0 in | Wt 193.5 lb

## 2012-05-24 DIAGNOSIS — E785 Hyperlipidemia, unspecified: Secondary | ICD-10-CM

## 2012-05-24 DIAGNOSIS — E119 Type 2 diabetes mellitus without complications: Secondary | ICD-10-CM

## 2012-05-24 DIAGNOSIS — I10 Essential (primary) hypertension: Secondary | ICD-10-CM

## 2012-05-24 DIAGNOSIS — B351 Tinea unguium: Secondary | ICD-10-CM | POA: Insufficient documentation

## 2012-05-24 DIAGNOSIS — D631 Anemia in chronic kidney disease: Secondary | ICD-10-CM

## 2012-05-24 LAB — LIPID PANEL: Total CHOL/HDL Ratio: 4

## 2012-05-24 LAB — COMPREHENSIVE METABOLIC PANEL
ALT: 16 U/L (ref 0–35)
AST: 8 U/L (ref 0–37)
Albumin: 3.7 g/dL (ref 3.5–5.2)
Calcium: 7.9 mg/dL — ABNORMAL LOW (ref 8.4–10.5)
Chloride: 105 mEq/L (ref 96–112)
Potassium: 3.9 mEq/L (ref 3.5–5.1)
Total Protein: 8 g/dL (ref 6.0–8.3)

## 2012-05-24 LAB — HEMOGLOBIN A1C: Hgb A1c MFr Bld: 8.5 % — ABNORMAL HIGH (ref 4.6–6.5)

## 2012-05-24 NOTE — Assessment & Plan Note (Signed)
Not at goal off ramipril.. Likely will need to start back as previously discussed. Check cr first.

## 2012-05-24 NOTE — Assessment & Plan Note (Signed)
Due for re-eval. 

## 2012-05-24 NOTE — Assessment & Plan Note (Signed)
Dude for re-eval.

## 2012-05-24 NOTE — Progress Notes (Signed)
Subjective:    Patient ID: Doris [name], female    DOB: February 28, 1933, 79 y.o.   MRN: [id number]  HPI  Diabetes:Improving control on 70/30, 30 Units in AM and 20 Units in PM Lab Results  Component Value Date   HGBA1C 7.1* 02/20/2012   Hypoglycemic episodes: yes  Hyperglycemic episodes:None  Feet problems:sweelling, no lesions  Blood Sugars averaging:not checking during holidays eye exam within last year: 10/2011  Better control of diet and walking some. She has been Cheating some over holidays. Wt Readings from Last 3 Encounters:  05/24/12 193 lb 8 oz (87.771 kg)  02/17/12 192 lb (87.091 kg)  11/11/11 196 lb 12 oz (89.245 kg)     Hypertension:Was supposed to restart ramipril after ARF resolved , but she never did. She is taking toprol and amlodipine.  Chest pain with exertion:None  Edema:slight  Short of breath:None  Average home BPs:not checking.  Other issues:   Elevated Cholesterol:Due for 6 month re-eval  on lipitor and zetia  Lab Results  Component Value Date   CHOL 151 10/28/2011   HDL 48.90 10/28/2011   LDLCALC 75 10/28/2011   TRIG 135.0 10/28/2011   CHOLHDL 3 10/28/2011  Using medications without problems:None  Muscle aches: None  Diet compliance:fair  Exercise:None  Other complaints:   Acute on chronic renal failure III: Improved cr from 2.65, but still high.  Has not started back ramipril. Has not returned to see Dr. Lowell Guitar.  ? Infection under nail on left foot, under great toenail.,  No pain, no redness. She just notes a lot of skin under nail. Has noted small discharge on stocking once.  Ongoing x 1 week.      Review of Systems  Constitutional: Negative for fever and fatigue.  HENT: Negative for ear pain.   Eyes: Negative for pain.  Respiratory: Negative for chest tightness and shortness of breath.   Cardiovascular: Negative for chest pain, palpitations and leg swelling.  Gastrointestinal: Negative for abdominal pain.  Genitourinary: Negative for dysuria.         Objective:   Physical Exam  Constitutional: Vital signs are normal. She appears well-developed and well-nourished. She is cooperative.  Non-toxic appearance. She does not appear ill. No distress.  HENT:  Head: Normocephalic.  Right Ear: Hearing, tympanic membrane, external ear and ear canal normal. Tympanic membrane is not erythematous, not retracted and not bulging.  Left Ear: Hearing, tympanic membrane, external ear and ear canal normal. Tympanic membrane is not erythematous, not retracted and not bulging.  Nose: No mucosal edema or rhinorrhea. Right sinus exhibits no maxillary sinus tenderness and no frontal sinus tenderness. Left sinus exhibits no maxillary sinus tenderness and no frontal sinus tenderness.  Mouth/Throat: Uvula is midline, oropharynx is clear and moist and mucous membranes are normal.  Eyes: Conjunctivae normal, EOM and lids are normal. Pupils are equal, round, and reactive to light. No foreign bodies found.  Neck: Trachea normal and normal range of motion. Neck supple. Carotid bruit is not present. No mass and no thyromegaly present.  Cardiovascular: Normal rate, regular rhythm, S1 normal, S2 normal, normal heart sounds, intact distal pulses and normal pulses.  Exam reveals no gallop and no friction rub.   No murmur heard. Pulmonary/Chest: Effort normal and breath sounds normal. Not tachypneic. No respiratory distress. She has no decreased breath sounds. She has no wheezes. She has no rhonchi. She has no rales.  Abdominal: Soft. Normal appearance and bowel sounds are normal. There is no tenderness.  Neurological: She is  alert.  Skin: Skin is warm, dry and intact. No rash noted.  Psychiatric: Her speech is normal and behavior is normal. Judgment and thought content normal. Her mood appears not anxious. Cognition and memory are normal. She does not exhibit a depressed mood.     Diabetic foot exam: Normal inspection No skin breakdown B calluses  Normal DP  pulses Normal sensation to light touch and monofilament Nails thickened and with fungal infection in 1 and 5  Bilaterally.      Assessment & Plan:

## 2012-05-24 NOTE — Patient Instructions (Signed)
Set up appt with podiatry for toenail care.  You have fungal infection of nails.Doris [name] may consider treating. No sign of bacterial infection on foot. We will likely have you restart ramipril once kidney function returns.  Follow up in 3 months for medicare wellness with labs prior.

## 2012-05-25 LAB — VITAMIN D 25 HYDROXY (VIT D DEFICIENCY, FRACTURES): Vit D, 25-Hydroxy: 33 ng/mL (ref 30–89)

## 2012-07-03 ENCOUNTER — Other Ambulatory Visit: Payer: Self-pay | Admitting: Family Medicine

## 2012-07-19 ENCOUNTER — Other Ambulatory Visit: Payer: Self-pay | Admitting: Family Medicine

## 2012-08-21 ENCOUNTER — Other Ambulatory Visit: Payer: Self-pay | Admitting: Family Medicine

## 2012-08-22 ENCOUNTER — Other Ambulatory Visit (INDEPENDENT_AMBULATORY_CARE_PROVIDER_SITE_OTHER): Payer: Medicare Other

## 2012-08-22 DIAGNOSIS — E1059 Type 1 diabetes mellitus with other circulatory complications: Secondary | ICD-10-CM

## 2012-08-22 DIAGNOSIS — E7800 Pure hypercholesterolemia, unspecified: Secondary | ICD-10-CM

## 2012-08-22 DIAGNOSIS — D649 Anemia, unspecified: Secondary | ICD-10-CM

## 2012-08-22 LAB — BASIC METABOLIC PANEL
BUN: 48 mg/dL — ABNORMAL HIGH (ref 6–23)
Chloride: 104 mEq/L (ref 96–112)
Glucose, Bld: 143 mg/dL — ABNORMAL HIGH (ref 70–99)
Potassium: 4 mEq/L (ref 3.5–5.1)

## 2012-08-22 LAB — HEPATIC FUNCTION PANEL
Bilirubin, Direct: 0 mg/dL (ref 0.0–0.3)
Total Bilirubin: 0.5 mg/dL (ref 0.3–1.2)

## 2012-08-22 NOTE — Addendum Note (Signed)
Addended by: Alvina Chou on: 08/22/2012 03:00 PM   Modules accepted: Orders

## 2012-08-23 ENCOUNTER — Telehealth: Payer: Self-pay | Admitting: Family Medicine

## 2012-08-23 NOTE — Telephone Encounter (Signed)
What is this for

## 2012-08-30 ENCOUNTER — Encounter: Payer: Self-pay | Admitting: Family Medicine

## 2012-08-30 ENCOUNTER — Ambulatory Visit (INDEPENDENT_AMBULATORY_CARE_PROVIDER_SITE_OTHER): Payer: Medicare Other | Admitting: Family Medicine

## 2012-08-30 VITALS — BP 130/78 | HR 72 | Temp 98.2°F | Ht 67.0 in | Wt 195.8 lb

## 2012-08-30 DIAGNOSIS — I10 Essential (primary) hypertension: Secondary | ICD-10-CM

## 2012-08-30 DIAGNOSIS — Z0000 Encounter for general adult medical examination without abnormal findings: Secondary | ICD-10-CM

## 2012-08-30 DIAGNOSIS — Z1212 Encounter for screening for malignant neoplasm of rectum: Secondary | ICD-10-CM

## 2012-08-30 DIAGNOSIS — E785 Hyperlipidemia, unspecified: Secondary | ICD-10-CM

## 2012-08-30 DIAGNOSIS — E119 Type 2 diabetes mellitus without complications: Secondary | ICD-10-CM

## 2012-08-30 NOTE — Assessment & Plan Note (Signed)
Well controlled. Continue current medication.  

## 2012-08-30 NOTE — Assessment & Plan Note (Addendum)
Poor control but diet is not ideal. Previously well controlled on current meds, occ lows... I hesitate to increase med. Will have her work on lifestyle changes and return in 3 months.

## 2012-08-30 NOTE — Patient Instructions (Addendum)
Increase water intake.Marland Kitchen Return for a renal panel  In 1 week. Continue working on low carbohydrate diet... Avoid sweets, dried fruit ( like raisons) and grits.  Stop at lab on way out to pick up the stool kit. Follow up 3 months for DM.

## 2012-08-30 NOTE — Assessment & Plan Note (Signed)
Elevated creatine. Drink fluids and recehck in 1 week.  No recent med changes, never restarted lisinopril.

## 2012-08-30 NOTE — Progress Notes (Signed)
Subjective:    Patient ID: Doris [name], female    DOB: 01-07-1933, 79 y.o.   MRN: [id number]  HPI  I have personally reviewed the Medicare Annual Wellness questionnaire and have noted 1. The patient's medical and social history 2. Their use of alcohol, tobacco or illicit drugs 3. Their current medications and supplements 4. The patient's functional ability including ADL's, fall risks, home safety risks and hearing or visual             impairment. 5. Diet and physical activities 6. Evidence for depression or mood disorders The patients weight, height, BMI and visual acuity have been recorded in the chart I have made referrals, counseling and provided education to the patient based review of the above and I have provided the pt with a written personalized care plan for preventive services.  Diabetes:Worsened control on 70/30, 30 Units in AM and 20 Units in PM  HAs been eating grits for breakfast everday. Lab Results  Component Value Date   HGBA1C 8.1* 08/22/2012  Hypoglycemic episodes: yes .Marland Kitchen ate late that day Hyperglycemic episodes:None  Feet problems:swelling, no lesions .Marland Kitchen Did have burn on right top of great toe.Marland Kitchen Healed now. Blood Sugars averaging:not checking  eye exam within last year: Cataract removal planned 09/11/2012 She feels like she is doing fairly well with diet.  Wt Readings from Last 3 Encounters:  08/30/12 195 lb 12 oz (88.792 kg)  05/24/12 193 lb 8 oz (87.771 kg)  02/17/12 192 lb (87.091 kg)    Hypertension  Well controlled on toprol and amlodipine.  Never restarted ramipril. Chest pain with exertion:None  Edema:slight  Short of breath:None  Average home BPs:not checking.  Other issues:   Elevated Cholesterol:  At goal on lipitor and zetia  Lab Results  Component Value Date   CHOL 131 05/24/2012   HDL 36.20* 05/24/2012   LDLCALC 79 05/24/2012   TRIG 78.0 05/24/2012   CHOLHDL 4 05/24/2012   Using medications without problems:None  Muscle aches: None  Diet  compliance:fair  Exercise:None  Other complaints:   Acute on chronic renal failure III: Cr 2.8.  Never started back on ramipril. She has not been drinking much fluids. Has not returned to see Dr. Lowell Guitar.     Review of Systems  Constitutional: Negative for fever and fatigue.  HENT: Negative for ear pain.   Eyes: Negative for pain.  Respiratory: Negative for chest tightness and shortness of breath.   Cardiovascular: Negative for chest pain, palpitations and leg swelling.  Gastrointestinal: Negative for abdominal pain.  Genitourinary: Negative for dysuria.       Objective:   Physical Exam  Constitutional: Vital signs are normal. She appears well-developed and well-nourished. She is cooperative.  Non-toxic appearance. She does not appear ill. No distress.  HENT:  Head: Normocephalic.  Right Ear: Hearing, tympanic membrane, external ear and ear canal normal.  Left Ear: Hearing, tympanic membrane, external ear and ear canal normal.  Nose: Nose normal.  Eyes: Conjunctivae, EOM and lids are normal. Pupils are equal, round, and reactive to light. No foreign bodies found.  Neck: Trachea normal and normal range of motion. Neck supple. Carotid bruit is not present. No mass and no thyromegaly present.  Cardiovascular: Normal rate, regular rhythm, S1 normal, S2 normal, normal heart sounds and intact distal pulses.  Exam reveals no gallop.   No murmur heard. Pulmonary/Chest: Effort normal and breath sounds normal. No respiratory distress. She has no wheezes. She has no rhonchi. She has no rales.  Abdominal: Soft. Normal appearance and bowel sounds are normal. She exhibits no distension, no fluid wave, no abdominal bruit and no mass. There is no hepatosplenomegaly. There is no tenderness. There is no rebound, no guarding and no CVA tenderness. No hernia.  Genitourinary: No breast swelling, tenderness, discharge or bleeding.  Lymphadenopathy:    She has no cervical adenopathy.    She has no  axillary adenopathy.  Neurological: She is alert. She has normal strength. No cranial nerve deficit or sensory deficit.  Skin: Skin is warm, dry and intact. No rash noted.  Psychiatric: Her speech is normal and behavior is normal. Judgment normal. Her mood appears not anxious. Cognition and memory are normal. She does not exhibit a depressed mood.   Diabetic foot exam: Normal inspection No skin breakdown No calluses  Normal DP pulses Normal sensation to light touch and monofilament Nails thickend but trimmed nicely         Assessment & Plan:  The patient's preventative maintenance and recommended screening tests for an annual wellness exam were reviewed in full today. Brought up to date unless services declined.  Counselled on the importance of diet, exercise, and its role in overall health and mortality. The patient's FH and SH was reviewed, including their home life, tobacco status, and drug and alcohol status.   Vaccines: Due for Td, PNA and shingles vaccines, refused all.  Colon: Followed with ifob.. Last one 2012. DEXA:OVerdue..I have recommended that this patient have a mammogram, tetanus booster and bone density but she declines at this time. I have discussed the risks and benefits of this examination with her. The patient verbalizes understanding. At this time.  Mammo: Overdue... refused PAP/DVE: not indicated

## 2012-09-04 ENCOUNTER — Other Ambulatory Visit: Payer: Self-pay | Admitting: Family Medicine

## 2012-09-06 ENCOUNTER — Other Ambulatory Visit (INDEPENDENT_AMBULATORY_CARE_PROVIDER_SITE_OTHER): Payer: Medicare Other

## 2012-09-06 LAB — BASIC METABOLIC PANEL
Chloride: 102 mEq/L (ref 96–112)
Creatinine, Ser: 2.5 mg/dL — ABNORMAL HIGH (ref 0.4–1.2)
Potassium: 3.9 mEq/L (ref 3.5–5.1)
Sodium: 138 mEq/L (ref 135–145)

## 2012-09-07 ENCOUNTER — Encounter: Payer: Self-pay | Admitting: *Deleted

## 2012-09-07 ENCOUNTER — Other Ambulatory Visit: Payer: Medicare Other

## 2012-09-07 LAB — FECAL OCCULT BLOOD, IMMUNOCHEMICAL: Fecal Occult Bld: NEGATIVE

## 2012-09-18 ENCOUNTER — Other Ambulatory Visit: Payer: Self-pay | Admitting: Family Medicine

## 2012-10-09 ENCOUNTER — Other Ambulatory Visit: Payer: Self-pay | Admitting: Family Medicine

## 2012-10-21 ENCOUNTER — Other Ambulatory Visit: Payer: Self-pay | Admitting: Family Medicine

## 2012-11-13 ENCOUNTER — Other Ambulatory Visit: Payer: Self-pay | Admitting: Family Medicine

## 2012-11-15 ENCOUNTER — Other Ambulatory Visit: Payer: Self-pay | Admitting: Family Medicine

## 2012-11-30 ENCOUNTER — Ambulatory Visit: Payer: Medicare Other | Admitting: Family Medicine

## 2012-12-04 ENCOUNTER — Ambulatory Visit: Payer: Medicare Other | Admitting: Family Medicine

## 2012-12-11 ENCOUNTER — Ambulatory Visit (INDEPENDENT_AMBULATORY_CARE_PROVIDER_SITE_OTHER): Payer: Medicare Other | Admitting: Family Medicine

## 2012-12-11 ENCOUNTER — Encounter: Payer: Self-pay | Admitting: Family Medicine

## 2012-12-11 VITALS — BP 120/70 | HR 80 | Temp 98.8°F | Ht 67.0 in | Wt 183.2 lb

## 2012-12-11 DIAGNOSIS — M5430 Sciatica, unspecified side: Secondary | ICD-10-CM

## 2012-12-11 DIAGNOSIS — E119 Type 2 diabetes mellitus without complications: Secondary | ICD-10-CM

## 2012-12-11 DIAGNOSIS — D649 Anemia, unspecified: Secondary | ICD-10-CM

## 2012-12-11 DIAGNOSIS — I10 Essential (primary) hypertension: Secondary | ICD-10-CM

## 2012-12-11 DIAGNOSIS — E785 Hyperlipidemia, unspecified: Secondary | ICD-10-CM

## 2012-12-11 LAB — COMPREHENSIVE METABOLIC PANEL
ALT: 15 U/L (ref 0–35)
AST: 8 U/L (ref 0–37)
Alkaline Phosphatase: 80 U/L (ref 39–117)
CO2: 27 mEq/L (ref 19–32)
Sodium: 141 mEq/L (ref 135–145)
Total Bilirubin: 0.4 mg/dL (ref 0.3–1.2)
Total Protein: 7.8 g/dL (ref 6.0–8.3)

## 2012-12-11 LAB — LIPID PANEL
HDL: 38.5 mg/dL — ABNORMAL LOW (ref >39.00)
LDL Cholesterol: 90 mg/dL (ref 0–99)
Total CHOL/HDL Ratio: 4
VLDL: 19.2 mg/dL (ref 0.0–40.0)

## 2012-12-11 LAB — HM DIABETES FOOT EXAM

## 2012-12-11 NOTE — Assessment & Plan Note (Signed)
Due for re-eval. 

## 2012-12-11 NOTE — Assessment & Plan Note (Signed)
Great control on current regimen.  

## 2012-12-11 NOTE — Progress Notes (Signed)
Diabetes:Due for re-eval. on 70/30, 30 Units in AM and 20 Units in PM HAs been eating grits for breakfast everday.  Lab Results  Component Value Date   HGBA1C 8.1* 08/22/2012  Hypoglycemic episodes: yes .Marland Kitchen ate late that day  Hyperglycemic episodes:None  Feet problems:swelling, no lesions .Marland Kitchen Did have burn on right top of great toe.Marland Kitchen Healed now.  Blood Sugars averaging:not checking  eye exam within last year: Cataract removal planned 09/11/2012  She feels like she is doing fairly well with diet.  Wt Readings from Last 3 Encounters:  12/11/12 183 lb 4 oz (83.122 kg)  08/30/12 195 lb 12 oz (88.792 kg)  05/24/12 193 lb 8 oz (87.771 kg)   Hypertension Well controlled on toprol and amlodipine. Never restarted ramipril.  Chest pain with exertion:None  Edema:slight  Short of breath:None  Average home BPs:not checking.  Other issues:   Elevated Cholesterol:  Due for re-eval.At goal on lipitor and zetia  Lab Results   Component  Value  Date    CHOL  131  05/24/2012    HDL  36.20*  05/24/2012    LDLCALC  79  05/24/2012    TRIG  78.0  05/24/2012    CHOLHDL  4  05/24/2012   Using medications without problems:None  Muscle aches: None  Diet compliance:fair  Exercise:None, walked a lot at [employer]. Other complaints:   Acute on chronic renal failure III: Cr 2.8.  Never started back on ramipril.  She has not been drinking much fluids.  Has not returned to see Dr. Lowell Guitar.   Review of Systems  Constitutional: Negative for fever and fatigue.  HENT: Negative for ear pain.  Eyes: Negative for pain.  Respiratory: Negative for chest tightness and shortness of breath.  Cardiovascular: Negative for chest pain, palpitations and leg swelling.  Gastrointestinal: Negative for abdominal pain.  Genitourinary: Negative for dysuria.  BACK PAIN IS BETTER. Objective:   Physical Exam  Constitutional: Vital signs are normal. She appears well-developed and well-nourished. She is cooperative. Non-toxic appearance. She  does not appear ill. No distress.  HENT:  Head: Normocephalic.  Right Ear: Hearing, tympanic membrane, external ear and ear canal normal.  Left Ear: Hearing, tympanic membrane, external ear and ear canal normal.  Nose: Nose normal.  Eyes: Conjunctivae, EOM and lids are normal. Pupils are equal, round, and reactive to light. No foreign bodies found.  Neck: Trachea normal and normal range of motion. Neck supple. Carotid bruit is not present. No mass and no thyromegaly present.  Cardiovascular: Normal rate, regular rhythm, S1 normal, S2 normal, normal heart sounds and intact distal pulses. Exam reveals no gallop.  No murmur heard.  Pulmonary/Chest: Effort normal and breath sounds normal. No respiratory distress. She has no wheezes. She has no rhonchi. She has no rales.  Abdominal: Soft. Normal appearance and bowel sounds are normal. She exhibits no distension, no fluid wave, no abdominal bruit and no mass. There is no hepatosplenomegaly. There is no tenderness. There is no rebound, no guarding and no CVA tenderness. No hernia.  Genitourinary: No breast swelling, tenderness, discharge or bleeding.  Lymphadenopathy:  She has no cervical adenopathy.  She has no axillary adenopathy.  Neurological: She is alert. She has normal strength. No cranial nerve deficit or sensory deficit.  Skin: Skin is warm, dry and intact. No rash noted.  Psychiatric: Her speech is normal and behavior is normal. Judgment normal. Her mood appears not anxious. Cognition and memory are normal. She does not exhibit a depressed mood.  Diabetic foot exam:  Normal inspection  No skin breakdown  No calluses  Normal DP pulses  Normal sensation to light touch and monofilament  Nails thickend but trimmed nicely

## 2012-12-11 NOTE — Patient Instructions (Addendum)
Stop at lab on your way out. Work on walking daily. Keep up healthy exercise.

## 2012-12-11 NOTE — Assessment & Plan Note (Signed)
Resolved. Encourage continued PT and exercise.

## 2012-12-11 NOTE — Progress Notes (Deleted)
  Subjective:    Patient ID: Doris [name], female    DOB: 10-14-1932, 79 y.o.   MRN: [id number]  HPI    Review of Systems     Objective:   Physical Exam        Assessment & Plan:

## 2012-12-31 ENCOUNTER — Other Ambulatory Visit: Payer: Self-pay | Admitting: Family Medicine

## 2013-01-13 ENCOUNTER — Other Ambulatory Visit: Payer: Self-pay | Admitting: Family Medicine

## 2013-02-28 DIAGNOSIS — Z0000 Encounter for general adult medical examination without abnormal findings: Secondary | ICD-10-CM

## 2013-03-12 ENCOUNTER — Other Ambulatory Visit: Payer: Self-pay | Admitting: Family Medicine

## 2013-04-29 ENCOUNTER — Other Ambulatory Visit: Payer: Self-pay | Admitting: Family Medicine

## 2013-08-02 ENCOUNTER — Encounter: Payer: Self-pay | Admitting: Family Medicine

## 2013-08-02 ENCOUNTER — Ambulatory Visit (INDEPENDENT_AMBULATORY_CARE_PROVIDER_SITE_OTHER): Payer: Medicare Other | Admitting: Family Medicine

## 2013-08-02 VITALS — BP 156/84 | HR 74 | Temp 98.2°F | Ht 67.0 in | Wt 195.2 lb

## 2013-08-02 DIAGNOSIS — I10 Essential (primary) hypertension: Secondary | ICD-10-CM

## 2013-08-02 DIAGNOSIS — E559 Vitamin D deficiency, unspecified: Secondary | ICD-10-CM

## 2013-08-02 DIAGNOSIS — E785 Hyperlipidemia, unspecified: Secondary | ICD-10-CM

## 2013-08-02 DIAGNOSIS — N039 Chronic nephritic syndrome with unspecified morphologic changes: Secondary | ICD-10-CM

## 2013-08-02 DIAGNOSIS — R600 Localized edema: Secondary | ICD-10-CM | POA: Insufficient documentation

## 2013-08-02 DIAGNOSIS — N183 Chronic kidney disease, stage 3 unspecified: Secondary | ICD-10-CM

## 2013-08-02 DIAGNOSIS — R609 Edema, unspecified: Secondary | ICD-10-CM | POA: Insufficient documentation

## 2013-08-02 DIAGNOSIS — E119 Type 2 diabetes mellitus without complications: Secondary | ICD-10-CM

## 2013-08-02 DIAGNOSIS — D631 Anemia in chronic kidney disease: Secondary | ICD-10-CM

## 2013-08-02 LAB — CBC WITH DIFFERENTIAL/PLATELET
Basophils Absolute: 0 K/uL (ref 0.0–0.1)
Basophils Relative: 0.4 % (ref 0.0–3.0)
Eosinophils Absolute: 0.2 K/uL (ref 0.0–0.7)
Eosinophils Relative: 1.9 % (ref 0.0–5.0)
HCT: 33.4 % — ABNORMAL LOW (ref 36.0–46.0)
Hemoglobin: 10.7 g/dL — ABNORMAL LOW (ref 12.0–15.0)
Lymphocytes Relative: 17.7 % (ref 12.0–46.0)
Lymphs Abs: 2 K/uL (ref 0.7–4.0)
MCHC: 31.9 g/dL (ref 30.0–36.0)
MCV: 76.6 fl — ABNORMAL LOW (ref 78.0–100.0)
Monocytes Absolute: 0.6 K/uL (ref 0.1–1.0)
Monocytes Relative: 5.7 % (ref 3.0–12.0)
Neutro Abs: 8.2 K/uL — ABNORMAL HIGH (ref 1.4–7.7)
Neutrophils Relative %: 74.3 % (ref 43.0–77.0)
Platelets: 369 K/uL (ref 150.0–400.0)
RBC: 4.37 Mil/uL (ref 3.87–5.11)
RDW: 17.9 % — ABNORMAL HIGH (ref 11.5–14.6)
WBC: 11.1 K/uL — ABNORMAL HIGH (ref 4.5–10.5)

## 2013-08-02 LAB — HEMOGLOBIN A1C: HEMOGLOBIN A1C: 8.5 % — AB (ref 4.6–6.5)

## 2013-08-02 LAB — LIPID PANEL
Cholesterol: 154 mg/dL (ref 0–200)
HDL: 39.2 mg/dL
LDL Cholesterol: 95 mg/dL (ref 0–99)
Total CHOL/HDL Ratio: 4
Triglycerides: 97 mg/dL (ref 0.0–149.0)
VLDL: 19.4 mg/dL (ref 0.0–40.0)

## 2013-08-02 LAB — COMPREHENSIVE METABOLIC PANEL WITH GFR
ALT: 10 U/L (ref 0–35)
AST: 6 U/L (ref 0–37)
Albumin: 3.9 g/dL (ref 3.5–5.2)
Alkaline Phosphatase: 79 U/L (ref 39–117)
BUN: 41 mg/dL — ABNORMAL HIGH (ref 6–23)
CO2: 27 meq/L (ref 19–32)
Calcium: 9.5 mg/dL (ref 8.4–10.5)
Chloride: 106 meq/L (ref 96–112)
Creatinine, Ser: 2.5 mg/dL — ABNORMAL HIGH (ref 0.4–1.2)
GFR: 23.91 mL/min — ABNORMAL LOW
Glucose, Bld: 65 mg/dL — ABNORMAL LOW (ref 70–99)
Potassium: 3.6 meq/L (ref 3.5–5.1)
Sodium: 141 meq/L (ref 135–145)
Total Bilirubin: 0.7 mg/dL (ref 0.3–1.2)
Total Protein: 7.5 g/dL (ref 6.0–8.3)

## 2013-08-02 MED ORDER — METOPROLOL SUCCINATE ER 200 MG PO TB24: ORAL_TABLET | ORAL | Status: DC

## 2013-08-02 MED ORDER — HYDROCHLOROTHIAZIDE 25 MG PO TABS: ORAL_TABLET | ORAL | Status: DC

## 2013-08-02 NOTE — Assessment & Plan Note (Signed)
Sounds like moderate control on NPHl... Due for re-eval with A1C.  Encouraged exercise, weight loss, healthy eating habits.

## 2013-08-02 NOTE — Patient Instructions (Signed)
Set up an appt with podiatry on your own.  Stop 100 mg and 50 mg of metoprolol.  Change to 200 mg daily of metoprolol.  Follow BP and pulse at home.  Follow up BP in 2 weeks.  Stop at lab on way out.

## 2013-08-02 NOTE — Assessment & Plan Note (Signed)
Due for re-eval. 

## 2013-08-02 NOTE — Progress Notes (Signed)
Pre visit review using our clinic review tool, if applicable. No additional management support is needed unless otherwise documented below in the visit note. 

## 2013-08-02 NOTE — Assessment & Plan Note (Signed)
Elevated. Increase metoprolol to 200 mg daily.

## 2013-08-02 NOTE — Assessment & Plan Note (Signed)
Concerning that edema may be due to worsening renal function... Check with labs.

## 2013-08-02 NOTE — Progress Notes (Signed)
80 year old female returns for follow up 6 months.  Diabetes:Due for re-eval. on 70/30, 30 Units in AM and 20 Units in PM HAs been eating grits for breakfast everday.  Lab Results  Component Value Date   HGBA1C 8.8* 12/11/2012  Hypoglycemic episodes: occ before breakfast Hyperglycemic episodes:rarely Feet problems:swelling Blood Sugars averaging: FBS 130s eye exam within last year: due next in 08/2013 She feels like she is doing fairly well with diet.   She has gained weight since last check. Wt Readings from Last 3 Encounters:  08/02/13 195 lb 4 oz (88.565 kg)  12/11/12 183 lb 4 oz (83.122 kg)  08/30/12 195 lb 12 oz (88.792 kg)    Hypertension Previously well controlled on HCTZ, toprol  150 mg daily and amlodipine. Never restarted ramipril.  BP Readings from Last 3 Encounters:  08/02/13 156/84  12/11/12 120/70  08/30/12 130/78  Chest pain with exertion:None  Edema: daily in last month Short of breath:None  Average home BPs:158/78 Other issues:   Elevated Cholesterol: Due for re-eval.At goal on lipitor and zetia previously. Lab Results  Component Value Date   CHOL 148 12/11/2012   HDL 38.50* 12/11/2012   LDLCALC 90 12/11/2012   TRIG 96.0 12/11/2012   CHOLHDL 4 12/11/2012  Using medications without problems:None  Muscle aches: None  Diet compliance:fair  Exercise:None, walked a lot at [employer].  Other complaints:   Acute on chronic renal failure III: Cr 2.8.  Never started back on ramipril.  She has not been drinking much fluids.  Has not returned to see Dr. Florene Glen.   Review of Systems  Constitutional: Negative for fever and fatigue.  HENT: Negative for ear pain.  Eyes: Negative for pain.  Respiratory: Negative for chest tightness and shortness of breath.  Cardiovascular: Negative for chest pain, palpitations and leg swelling.  Gastrointestinal: Negative for abdominal pain.  Genitourinary: Negative for dysuria.  BACK PAIN IS BETTER.  Objective:   Physical Exam   Constitutional: Vital signs are normal. She appears well-developed and well-nourished. She is cooperative. Non-toxic appearance. She does not appear ill. No distress.  HENT:  Head: Normocephalic.  Right Ear: Hearing, tympanic membrane, external ear and ear canal normal.  Left Ear: Hearing, tympanic membrane, external ear and ear canal normal.  Nose: Nose normal.  Eyes: Conjunctivae, EOM and lids are normal. Pupils are equal, round, and reactive to light. No foreign bodies found.  Neck: Trachea normal and normal range of motion. Neck supple. Carotid bruit is not present. No mass and no thyromegaly present.  Cardiovascular: Normal rate, regular rhythm, S1 normal, S2 normal, normal heart sounds and intact distal pulses. Exam reveals no gallop.  No murmur heard.  Pulmonary/Chest: Effort normal and breath sounds normal. No respiratory distress. She has no wheezes. She has no rhonchi. She has no rales.  Abdominal: Soft. Normal appearance and bowel sounds are normal. She exhibits no distension, no fluid wave, no abdominal bruit and no mass. There is no hepatosplenomegaly. There is no tenderness. There is no rebound, no guarding and no CVA tenderness. No hernia.  Genitourinary: No breast swelling, tenderness, discharge or bleeding.  Lymphadenopathy:  She has no cervical adenopathy.  She has no axillary adenopathy.  Neurological: She is alert. She has normal strength. No cranial nerve deficit or sensory deficit.  Skin: Skin is warm, dry and intact. No rash noted.  Psychiatric: Her speech is normal and behavior is normal. Judgment normal. Her mood appears not anxious. Cognition and memory are normal. She does not  exhibit a depressed mood.   Diabetic foot exam:  Normal inspection  No skin breakdown  No calluses  Normal DP pulses  Normal sensation to light touch and monofilament  Nails thickend

## 2013-08-03 LAB — VITAMIN D 25 HYDROXY (VIT D DEFICIENCY, FRACTURES): VIT D 25 HYDROXY: 49 ng/mL (ref 30–89)

## 2013-08-04 ENCOUNTER — Other Ambulatory Visit: Payer: Self-pay | Admitting: Family Medicine

## 2013-08-05 ENCOUNTER — Telehealth: Payer: Self-pay | Admitting: Family Medicine

## 2013-08-05 NOTE — Telephone Encounter (Signed)
Relevant patient education mailed to patient.  

## 2013-08-16 ENCOUNTER — Encounter: Payer: Self-pay | Admitting: Family Medicine

## 2013-08-16 ENCOUNTER — Ambulatory Visit (INDEPENDENT_AMBULATORY_CARE_PROVIDER_SITE_OTHER): Payer: Medicare Other | Admitting: Family Medicine

## 2013-08-16 VITALS — BP 120/60 | HR 68 | Temp 98.2°F | Ht 67.0 in | Wt 194.5 lb

## 2013-08-16 DIAGNOSIS — E1365 Other specified diabetes mellitus with hyperglycemia: Secondary | ICD-10-CM

## 2013-08-16 DIAGNOSIS — N184 Chronic kidney disease, stage 4 (severe): Secondary | ICD-10-CM

## 2013-08-16 DIAGNOSIS — E1322 Other specified diabetes mellitus with diabetic chronic kidney disease: Secondary | ICD-10-CM

## 2013-08-16 DIAGNOSIS — I10 Essential (primary) hypertension: Secondary | ICD-10-CM

## 2013-08-16 DIAGNOSIS — E1122 Type 2 diabetes mellitus with diabetic chronic kidney disease: Secondary | ICD-10-CM | POA: Insufficient documentation

## 2013-08-16 DIAGNOSIS — N039 Chronic nephritic syndrome with unspecified morphologic changes: Secondary | ICD-10-CM

## 2013-08-16 DIAGNOSIS — IMO0002 Reserved for concepts with insufficient information to code with codable children: Secondary | ICD-10-CM

## 2013-08-16 DIAGNOSIS — E785 Hyperlipidemia, unspecified: Secondary | ICD-10-CM

## 2013-08-16 DIAGNOSIS — E1129 Type 2 diabetes mellitus with other diabetic kidney complication: Secondary | ICD-10-CM

## 2013-08-16 DIAGNOSIS — E1329 Other specified diabetes mellitus with other diabetic kidney complication: Secondary | ICD-10-CM

## 2013-08-16 DIAGNOSIS — D631 Anemia in chronic kidney disease: Secondary | ICD-10-CM

## 2013-08-16 LAB — HM DIABETES FOOT EXAM

## 2013-08-16 NOTE — Patient Instructions (Addendum)
Continue current BP regimen. Working on [employer], weight loss and exercise.  Let me know if you are open to  seeing a nutritionist.  Increase 70/30 to 35 units and 25 units.  Check fasting blood sugar and , 2 hours after meals. Call in 1-2 weeks with blood sugar measurements. Call earlier if any lows. Follow up in 3 months with fasting labs prior.

## 2013-08-16 NOTE — Progress Notes (Signed)
Pre visit review using our clinic review tool, if applicable. No additional management support is needed unless otherwise documented below in the visit note. 

## 2013-08-16 NOTE — Assessment & Plan Note (Signed)
Well controlled. Continue current medication.  

## 2013-08-16 NOTE — Assessment & Plan Note (Signed)
Stable Creatinine.  Discussed dialysis potential in future and pt refuses, she states she will never do dialysis.  Vit D stable.

## 2013-08-16 NOTE — Assessment & Plan Note (Signed)
Improved control on higher dose BBlocker.  Pt had reaction to ACEI in past. Could consider retrying low dose.

## 2013-08-16 NOTE — Assessment & Plan Note (Signed)
Next lab check will check iron panel and erythropoeitn. MAy need referral to heme for erythropo inj.

## 2013-08-16 NOTE — Progress Notes (Signed)
Subjective:    Patient ID: Doris [name], female    DOB: 15-Jul-1932, 80 y.o.   MRN: [id number]  HPI  80 year old female with DM and HTN presents for BP follow up.  At last OV BP high, increased metoprolol to 200 mg daily.   Today BPs are much better! BP Readings from Last 3 Encounters:  08/16/13 120/60  08/02/13 156/84  12/11/12 120/70   HR 68 today.  No HR < 60 at home.  No CP, no SOB, not lightheaded.  Wt Readings from Last 3 Encounters:  08/16/13 194 lb 8 oz (88.225 kg)  08/02/13 195 lb 4 oz (88.565 kg)  12/11/12 183 lb 4 oz (83.122 kg)    DM... Still room for improvement with diet. On 70/30  30 units AM and 20 Units PM. Not checking FBS. Has not felt any lows recently. Lab Results  Component Value Date   HGBA1C 8.5* 08/02/2013   She refuses nutrition referral.  High chol: Goal < 100, at goal on 80 mg statin. Lab Results  Component Value Date   CHOL 154 08/02/2013   HDL 39.20 08/02/2013   LDLCALC 95 08/02/2013   TRIG 97.0 08/02/2013   CHOLHDL 4 08/02/2013     Stage 4 CKD, stable. Pt refuses dialysis.  Had worsening of creatinine with ACEI. She refuses to see a kidney MD.  Anemia of chronic disease.  No fatigue.  No blood loss. Vit D, well controlled.      Review of Systems  Constitutional: Negative for fever and fatigue.  HENT: Negative for ear pain.   Eyes: Negative for pain.  Respiratory: Negative for chest tightness and shortness of breath.   Cardiovascular: Negative for chest pain, palpitations and leg swelling.  Gastrointestinal: Negative for abdominal pain.  Genitourinary: Negative for dysuria.       Objective:   Physical Exam  Constitutional: Vital signs are normal. She appears well-developed and well-nourished. She is cooperative.  Non-toxic appearance. She does not appear ill. No distress.  HENT:  Head: Normocephalic.  Right Ear: Hearing, tympanic membrane, external ear and ear canal normal. Tympanic membrane is not erythematous, not  retracted and not bulging.  Left Ear: Hearing, tympanic membrane, external ear and ear canal normal. Tympanic membrane is not erythematous, not retracted and not bulging.  Nose: No mucosal edema or rhinorrhea. Right sinus exhibits no maxillary sinus tenderness and no frontal sinus tenderness. Left sinus exhibits no maxillary sinus tenderness and no frontal sinus tenderness.  Mouth/Throat: Uvula is midline, oropharynx is clear and moist and mucous membranes are normal.  Eyes: Conjunctivae, EOM and lids are normal. Pupils are equal, round, and reactive to light. Lids are everted and swept, no foreign bodies found.  Neck: Trachea normal and normal range of motion. Neck supple. Carotid bruit is not present. No mass and no thyromegaly present.  Cardiovascular: Normal rate, regular rhythm, S1 normal, S2 normal, normal heart sounds, intact distal pulses and normal pulses.  Exam reveals no gallop and no friction rub.   No murmur heard. Pulmonary/Chest: Effort normal and breath sounds normal. Not tachypneic. No respiratory distress. She has no decreased breath sounds. She has no wheezes. She has no rhonchi. She has no rales.  Abdominal: Soft. Normal appearance and bowel sounds are normal. There is no tenderness.  Neurological: She is alert.  Skin: Skin is warm, dry and intact. No rash noted.  Psychiatric: Her speech is normal and behavior is normal. Judgment and thought content normal. Her mood appears not  anxious. Cognition and memory are normal. She does not exhibit a depressed mood.   Diabetic foot exam: Normal inspection No skin breakdown Preulcerative  calluses  Normal DP pulses Normal sensation to light touch and monofilament Nails thickened        Assessment & Plan:

## 2013-08-16 NOTE — Assessment & Plan Note (Signed)
Increase 70/30. She refuses other meds at this time.  Will work on lifestyle changes and consider nutrition referral. Will call with measurements to consider further adjustment after 1-2 weeks.

## 2013-09-09 ENCOUNTER — Other Ambulatory Visit: Payer: Self-pay | Admitting: Family Medicine

## 2013-09-12 ENCOUNTER — Telehealth: Payer: Self-pay

## 2013-09-12 NOTE — Telephone Encounter (Signed)
Relevant patient education mailed to patient.  

## 2013-11-12 ENCOUNTER — Telehealth: Payer: Self-pay | Admitting: Family Medicine

## 2013-11-12 ENCOUNTER — Other Ambulatory Visit (INDEPENDENT_AMBULATORY_CARE_PROVIDER_SITE_OTHER): Payer: Medicare Other

## 2013-11-12 DIAGNOSIS — E1129 Type 2 diabetes mellitus with other diabetic kidney complication: Secondary | ICD-10-CM

## 2013-11-12 DIAGNOSIS — N039 Chronic nephritic syndrome with unspecified morphologic changes: Principal | ICD-10-CM

## 2013-11-12 DIAGNOSIS — E1122 Type 2 diabetes mellitus with diabetic chronic kidney disease: Secondary | ICD-10-CM

## 2013-11-12 DIAGNOSIS — E785 Hyperlipidemia, unspecified: Secondary | ICD-10-CM

## 2013-11-12 DIAGNOSIS — E559 Vitamin D deficiency, unspecified: Secondary | ICD-10-CM

## 2013-11-12 DIAGNOSIS — D631 Anemia in chronic kidney disease: Secondary | ICD-10-CM

## 2013-11-12 DIAGNOSIS — N184 Chronic kidney disease, stage 4 (severe): Secondary | ICD-10-CM

## 2013-11-12 LAB — HEMOGLOBIN A1C: HEMOGLOBIN A1C: 7.5 % — AB (ref 4.6–6.5)

## 2013-11-12 NOTE — Telephone Encounter (Signed)
Message copied by Jinny Sanders on Tue Nov 12, 2013  7:28 AM ------      Message from: Ellamae Sia      Created: Tue Nov 05, 2013 12:28 PM      Regarding: Lab orders for Tuesday, 6.23.15       Lab orders for a 3 month check up ------

## 2013-11-19 ENCOUNTER — Encounter: Payer: Self-pay | Admitting: Family Medicine

## 2013-11-19 ENCOUNTER — Ambulatory Visit (INDEPENDENT_AMBULATORY_CARE_PROVIDER_SITE_OTHER): Payer: Medicare Other | Admitting: Family Medicine

## 2013-11-19 VITALS — BP 165/76 | HR 76 | Temp 98.1°F | Ht 67.0 in | Wt 194.8 lb

## 2013-11-19 DIAGNOSIS — N184 Chronic kidney disease, stage 4 (severe): Secondary | ICD-10-CM

## 2013-11-19 DIAGNOSIS — R600 Localized edema: Secondary | ICD-10-CM

## 2013-11-19 DIAGNOSIS — E1129 Type 2 diabetes mellitus with other diabetic kidney complication: Secondary | ICD-10-CM

## 2013-11-19 DIAGNOSIS — R609 Edema, unspecified: Secondary | ICD-10-CM

## 2013-11-19 DIAGNOSIS — N039 Chronic nephritic syndrome with unspecified morphologic changes: Secondary | ICD-10-CM

## 2013-11-19 DIAGNOSIS — E785 Hyperlipidemia, unspecified: Secondary | ICD-10-CM

## 2013-11-19 DIAGNOSIS — I10 Essential (primary) hypertension: Secondary | ICD-10-CM

## 2013-11-19 DIAGNOSIS — D631 Anemia in chronic kidney disease: Secondary | ICD-10-CM

## 2013-11-19 DIAGNOSIS — E1122 Type 2 diabetes mellitus with diabetic chronic kidney disease: Secondary | ICD-10-CM | POA: Insufficient documentation

## 2013-11-19 LAB — HM DIABETES FOOT EXAM

## 2013-11-19 NOTE — Assessment & Plan Note (Signed)
Recheck Cr, check TSH. Consider eval of heart with ECHO if persisting.

## 2013-11-19 NOTE — Patient Instructions (Addendum)
Decrease 70/30 to 22 Units twice daily. Call if the low < 60 are persisting. Keep up work with diet, staying away from fast food and carbohydrates. Check blood sugar in morning and occ 2 hours after meals. Follow BP at home.. Call with measurements at home in next week. Goal < 130/70. Follow up in 3 months DM, chol etc. with fasting labs prior.

## 2013-11-19 NOTE — Progress Notes (Signed)
Pre visit review using our clinic review tool, if applicable. No additional management support is needed unless otherwise documented below in the visit note. 

## 2013-11-19 NOTE — Assessment & Plan Note (Signed)
Possibly contributing to worsening edema.  Will re-eval GFR, pt refuses today. WIll check at next LAb check. Will do earlier if edema worsens.

## 2013-11-19 NOTE — Assessment & Plan Note (Signed)
Improved control with lifestyle change.  Now with lows... Decrease 70/30 to 22 units twice daily.

## 2013-11-19 NOTE — Progress Notes (Signed)
80 year old female with DM and HTN presents for BP follow up.  Hypertension:   Worsened control for last check on metoprolol 200 mg daily, HCTZ, and amlodipine. ? Mild reaction to ACEI in past.  BP Readings from Last 3 Encounters:  11/19/13 165/76  08/16/13 120/60  08/02/13 156/84  Using medication without problems or lightheadedness:  Chest pain with exertion: None Edema: Has swelling, gets better after feet elevated, but not down all the way. Short of breath:None Average home BPs: not checking Other issues:  She has some pain in left arm after lying on left arm at night. Occ numbness. Pain resolves as she uses it in the AM. Ongoing in last month, better now.  DM... Improved DM control!! On 70/30 30 units AM and 25 Units PM.  FBS: 85-111, not checking post prandial. Has had 57 few days ago.  Stop eating fast food. Lab Results  Component Value Date   HGBA1C 7.5* 11/12/2013  She refuses nutrition referral.   Wt Readings from Last 3 Encounters:  11/19/13 194 lb 12 oz (88.338 kg)  08/16/13 194 lb 8 oz (88.225 kg)  08/02/13 195 lb 4 oz (88.565 kg)    High chol: Goal < 100, at goal on 80 mg statin.  Lab Results  Component Value Date   CHOL 154 08/02/2013   HDL 39.20 08/02/2013   LDLCALC 95 08/02/2013   TRIG 97.0 08/02/2013   CHOLHDL 4 08/02/2013    Stage 4 CKD, stable. Pt refuses dialysis.  Had worsening of creatinine with ACEI.  She refuses to see a kidney MD.   Anemia of chronic disease. No fatigue.  No blood loss.  Vit D, well controlled 07/2013 Due for erythropoietin and iron panel.  Review of Systems  Constitutional: Negative for fever and fatigue.  HENT: Negative for ear pain.  Eyes: Negative for pain.  Respiratory: Negative for chest tightness and shortness of breath.  Cardiovascular: Negative for chest pain, palpitations and leg swelling.  Gastrointestinal: Negative for abdominal pain.  Genitourinary: Negative for dysuria.  Objective:   Physical Exam   Constitutional: Vital signs are normal. She appears well-developed and well-nourished. She is cooperative. Non-toxic appearance. She does not appear ill. No distress.  HENT:  Head: Normocephalic.  Right Ear: Hearing, tympanic membrane, external ear and ear canal normal. Tympanic membrane is not erythematous, not retracted and not bulging.  Left Ear: Hearing, tympanic membrane, external ear and ear canal normal. Tympanic membrane is not erythematous, not retracted and not bulging.  Nose: No mucosal edema or rhinorrhea. Right sinus exhibits no maxillary sinus tenderness and no frontal sinus tenderness. Left sinus exhibits no maxillary sinus tenderness and no frontal sinus tenderness.  Mouth/Throat: Uvula is midline, oropharynx is clear and moist and mucous membranes are normal.  Eyes: Conjunctivae, EOM and lids are normal. Pupils are equal, round, and reactive to light. Lids are everted and swept, no foreign bodies found.  Neck: Trachea normal and normal range of motion. Neck supple. Carotid bruit is not present. No mass and no thyromegaly present.  Cardiovascular: Normal rate, regular rhythm, S1 normal, S2 normal, normal heart sounds, intact distal pulses and normal pulses. Exam reveals no gallop and no friction rub.  No murmur heard.  Pulmonary/Chest: Effort normal and breath sounds normal. Not tachypneic. No respiratory distress. She has no decreased breath sounds. She has no wheezes. She has no rhonchi. She has no rales.  Abdominal: Soft. Normal appearance and bowel sounds are normal. There is no tenderness.  Neurological:  She is alert.  Skin: Skin is warm, dry and intact. No rash noted.  Psychiatric: Her speech is normal and behavior is normal. Judgment and thought content normal. Her mood appears not anxious. Cognition and memory are normal. She does not exhibit a depressed mood.   Diabetic foot exam:  Normal inspection  No skin breakdown  Preulcerative calluses  Normal DP pulses  Normal  sensation to light touch and monofilament  Nails thickened

## 2013-11-19 NOTE — Assessment & Plan Note (Signed)
Poor control today, last OV well controlled. Pt compliant with meds.  Follow at home, if elevated will add medication.

## 2014-02-13 ENCOUNTER — Other Ambulatory Visit (INDEPENDENT_AMBULATORY_CARE_PROVIDER_SITE_OTHER): Payer: Medicare Other

## 2014-02-13 DIAGNOSIS — R609 Edema, unspecified: Secondary | ICD-10-CM

## 2014-02-13 DIAGNOSIS — N039 Chronic nephritic syndrome with unspecified morphologic changes: Secondary | ICD-10-CM

## 2014-02-13 DIAGNOSIS — E785 Hyperlipidemia, unspecified: Secondary | ICD-10-CM

## 2014-02-13 DIAGNOSIS — E1129 Type 2 diabetes mellitus with other diabetic kidney complication: Secondary | ICD-10-CM

## 2014-02-13 DIAGNOSIS — E1122 Type 2 diabetes mellitus with diabetic chronic kidney disease: Secondary | ICD-10-CM

## 2014-02-13 DIAGNOSIS — N184 Chronic kidney disease, stage 4 (severe): Secondary | ICD-10-CM

## 2014-02-13 DIAGNOSIS — D631 Anemia in chronic kidney disease: Secondary | ICD-10-CM

## 2014-02-13 LAB — BASIC METABOLIC PANEL
BUN: 48 mg/dL — AB (ref 6–23)
CALCIUM: 8.7 mg/dL (ref 8.4–10.5)
CO2: 28 mEq/L (ref 19–32)
Chloride: 107 mEq/L (ref 96–112)
Creatinine, Ser: 2.5 mg/dL — ABNORMAL HIGH (ref 0.4–1.2)
GFR: 23.88 mL/min — ABNORMAL LOW (ref >60.00)
Glucose, Bld: 88 mg/dL (ref 70–99)
Potassium: 3.6 mEq/L (ref 3.5–5.1)
Sodium: 141 mEq/L (ref 135–145)

## 2014-02-13 LAB — CBC WITH DIFFERENTIAL/PLATELET
BASOS ABS: 0.1 10*3/uL (ref 0.0–0.1)
Basophils Relative: 0.7 % (ref 0.0–3.0)
Eosinophils Absolute: 0.3 10*3/uL (ref 0.0–0.7)
Eosinophils Relative: 2.4 % (ref 0.0–5.0)
HEMATOCRIT: 33.2 % — AB (ref 36.0–46.0)
Hemoglobin: 10.6 g/dL — ABNORMAL LOW (ref 12.0–15.0)
LYMPHS ABS: 2.3 10*3/uL (ref 0.7–4.0)
LYMPHS PCT: 20.5 % (ref 12.0–46.0)
MCHC: 32.1 g/dL (ref 30.0–36.0)
MCV: 75.9 fl — ABNORMAL LOW (ref 78.0–100.0)
Monocytes Absolute: 0.8 10*3/uL (ref 0.1–1.0)
Monocytes Relative: 7 % (ref 3.0–12.0)
Neutro Abs: 7.8 10*3/uL — ABNORMAL HIGH (ref 1.4–7.7)
Neutrophils Relative %: 69.4 % (ref 43.0–77.0)
PLATELETS: 362 10*3/uL (ref 150.0–400.0)
RBC: 4.37 Mil/uL (ref 3.87–5.11)
RDW: 18.2 % — AB (ref 11.5–15.5)
WBC: 11.3 10*3/uL — ABNORMAL HIGH (ref 4.0–10.5)

## 2014-02-13 LAB — LIPID PANEL
Cholesterol: 142 mg/dL (ref 0–200)
HDL: 37 mg/dL — ABNORMAL LOW (ref >39.00)
LDL CALC: 89 mg/dL (ref 0–99)
NONHDL: 105
Total CHOL/HDL Ratio: 4
Triglycerides: 81 mg/dL (ref 0.0–149.0)
VLDL: 16.2 mg/dL (ref 0.0–40.0)

## 2014-02-13 LAB — IBC PANEL
IRON: 49 ug/dL (ref 42–145)
Saturation Ratios: 17.4 % — ABNORMAL LOW (ref 20.0–50.0)
Transferrin: 201.3 mg/dL — ABNORMAL LOW (ref 212.0–360.0)

## 2014-02-13 LAB — FERRITIN: Ferritin: 107.9 ng/mL (ref 10.0–291.0)

## 2014-02-13 LAB — VITAMIN D 25 HYDROXY (VIT D DEFICIENCY, FRACTURES): VITD: 62.79 ng/mL (ref 30.00–100.00)

## 2014-02-13 LAB — TSH: TSH: 1.33 u[IU]/mL (ref 0.35–4.50)

## 2014-02-13 LAB — HEMOGLOBIN A1C: Hgb A1c MFr Bld: 7.3 % — ABNORMAL HIGH (ref 4.6–6.5)

## 2014-02-17 LAB — ERYTHROPOIETIN: Erythropoietin: 8.6 m[IU]/mL (ref 2.6–18.5)

## 2014-02-21 ENCOUNTER — Ambulatory Visit: Payer: Medicare Other | Admitting: Family Medicine

## 2014-02-25 ENCOUNTER — Ambulatory Visit: Payer: Medicare Other | Admitting: Family Medicine

## 2014-02-25 DIAGNOSIS — Z0289 Encounter for other administrative examinations: Secondary | ICD-10-CM

## 2014-03-04 ENCOUNTER — Ambulatory Visit (INDEPENDENT_AMBULATORY_CARE_PROVIDER_SITE_OTHER): Payer: Medicare Other | Admitting: Family Medicine

## 2014-03-04 ENCOUNTER — Encounter: Payer: Self-pay | Admitting: Family Medicine

## 2014-03-04 VITALS — BP 106/72 | HR 72 | Temp 98.1°F | Ht 67.0 in | Wt 194.8 lb

## 2014-03-04 DIAGNOSIS — N184 Chronic kidney disease, stage 4 (severe): Secondary | ICD-10-CM

## 2014-03-04 DIAGNOSIS — E1122 Type 2 diabetes mellitus with diabetic chronic kidney disease: Secondary | ICD-10-CM

## 2014-03-04 DIAGNOSIS — D631 Anemia in chronic kidney disease: Secondary | ICD-10-CM

## 2014-03-04 DIAGNOSIS — E785 Hyperlipidemia, unspecified: Secondary | ICD-10-CM

## 2014-03-04 DIAGNOSIS — I10 Essential (primary) hypertension: Secondary | ICD-10-CM

## 2014-03-04 LAB — HM DIABETES FOOT EXAM

## 2014-03-04 NOTE — Assessment & Plan Note (Signed)
Erythropoietin nml, slight decrease in iron. Increase iron containing foods or temporarily take MVI with iron, pt refuses iron tab.

## 2014-03-04 NOTE — Assessment & Plan Note (Signed)
Well controlled. Continue current medication.  

## 2014-03-04 NOTE — Progress Notes (Signed)
80 year old female with DM and HTN presents for follow up.   Hypertension: well controlled on metoprolol 200 mg daily, HCTZ, and amlodipine.  ? Mild reaction to ACEI in past.  BP Readings from Last 3 Encounters:  03/04/14 106/72  11/19/13 165/76  08/16/13 120/60  Using medication without problems or lightheadedness:  none Chest pain with exertion: None  Edema: Has swelling, gets better after feet elevatedShort of breath:None  Average home BPs: 140/72 in AM, after med < 130/70 Other issues:  .  DM... Improving DM control!!  On 70/30 35 units AM and 25 Units PM.  FBS:not checking lately,  not checking post prandial.  Stopped eating fast food, trying to eat healthier. No exercise. Lab Results  Component Value Date   HGBA1C 7.3* 02/13/2014  EYE exam: due No foot ulcers.   Wt Readings from Last 3 Encounters:  03/04/14 194 lb 12 oz (88.338 kg)  11/19/13 194 lb 12 oz (88.338 kg)  08/16/13 194 lb 8 oz (88.225 kg)    High chol: Goal < 100, at goal on 80 mg statin.  Lab Results  Component Value Date   CHOL 142 02/13/2014   HDL 37.00* 02/13/2014   LDLCALC 89 02/13/2014   TRIG 81.0 02/13/2014   CHOLHDL 4 02/13/2014   Stage 4 CKD, stable. Pt refuses dialysis if needed in future. Had worsening of creatinine with ACEI.  She refuses to see a kidney MD.   Anemia of chronic disease. No fatigue.  No blood loss.  Vit D, well controlled 07/2013 Erythropoietin  nml and iron panel, hg stable, nml ferritin, low transferrin.   Thyroid is normal.  Review of Systems  Constitutional: Negative for fever and fatigue.  HENT: Negative for ear pain.  Eyes: Negative for pain.  Respiratory: Negative for chest tightness and shortness of breath.  Cardiovascular: Negative for chest pain, palpitations and leg swelling.  Gastrointestinal: Negative for abdominal pain.  Genitourinary: Negative for dysuria.  Objective:   Physical Exam  Constitutional: Vital signs are normal. She appears well-developed and  well-nourished. She is cooperative. Non-toxic appearance. She does not appear ill. No distress.  HENT:  Head: Normocephalic.  Right Ear: Hearing, tympanic membrane, external ear and ear canal normal. Tympanic membrane is not erythematous, not retracted and not bulging.  Left Ear: Hearing, tympanic membrane, external ear and ear canal normal. Tympanic membrane is not erythematous, not retracted and not bulging.  Nose: No mucosal edema or rhinorrhea. Right sinus exhibits no maxillary sinus tenderness and no frontal sinus tenderness. Left sinus exhibits no maxillary sinus tenderness and no frontal sinus tenderness.  Mouth/Throat: Uvula is midline, oropharynx is clear and moist and mucous membranes are normal.  Eyes: Conjunctivae, EOM and lids are normal. Pupils are equal, round, and reactive to light. Lids are everted and swept, no foreign bodies found.  Neck: Trachea normal and normal range of motion. Neck supple. Carotid bruit is not present. No mass and no thyromegaly present.  Cardiovascular: Normal rate, regular rhythm, S1 normal, S2 normal, normal heart sounds, intact distal pulses and normal pulses. Exam reveals no gallop and no friction rub.  No murmur heard.  Pulmonary/Chest: Effort normal and breath sounds normal. Not tachypneic. No respiratory distress. She has no decreased breath sounds. She has no wheezes. She has no rhonchi. She has no rales.  Abdominal: Soft. Normal appearance and bowel sounds are normal. There is no tenderness.  Neurological: She is alert.  Skin: Skin is warm, dry and intact. No rash noted.  Psychiatric: Her speech is normal and behavior is normal. Judgment and thought content normal. Her mood appears not anxious. Cognition and memory are normal. She does not exhibit a depressed mood.   Diabetic foot exam:  Normal inspection  No skin breakdown  Preulcerative calluses  Normal DP pulses  Slight decrease with monofilament on right great toe. Nails thickened

## 2014-03-04 NOTE — Progress Notes (Signed)
Pre visit review using our clinic review tool, if applicable. No additional management support is needed unless otherwise documented below in the visit note. 

## 2014-03-04 NOTE — Assessment & Plan Note (Signed)
Stable

## 2014-03-04 NOTE — Patient Instructions (Addendum)
Try to increase exercise or movement as you can.  Keep up great work on healthy eating.  Diabetes and Exercise Exercising regularly is important. It is not just about losing weight. It has many health benefits, such as:  Improving your overall fitness, flexibility, and endurance.  Increasing your bone density.  Helping with weight control.  Decreasing your body fat.  Increasing your muscle strength.  Reducing stress and tension.  Improving your overall health. People with diabetes who exercise gain additional benefits because exercise:  Reduces appetite.  Improves the body's use of blood sugar (glucose).  Helps lower or control blood glucose.  Decreases blood pressure.  Helps control blood lipids (such as cholesterol and triglycerides).  Improves the body's use of the hormone insulin by:  Increasing the body's insulin sensitivity.  Reducing the body's insulin needs.  Decreases the risk for heart disease because exercising:  Lowers cholesterol and triglycerides levels.  Increases the levels of good cholesterol (such as high-density lipoproteins [HDL]) in the body.  Lowers blood glucose levels. YOUR ACTIVITY PLAN  Choose an activity that you enjoy and set realistic goals. Your health care provider or diabetes educator can help you make an activity plan that works for you. Exercise regularly as directed by your health care provider. This includes:  Performing resistance training twice a week such as push-ups, sit-ups, lifting weights, or using resistance bands.  Performing 150 minutes of cardio exercises each week such as walking, running, or playing sports.  Staying active and spending no more than 90 minutes at one time being inactive. Even short bursts of exercise are good for you. Three 10-minute sessions spread throughout the day are just as beneficial as a single 30-minute session. Some exercise ideas include:  Taking the dog for a walk.  Taking the stairs  instead of the elevator.  Dancing to your favorite song.  Doing an exercise video.  Doing your favorite exercise with a friend. RECOMMENDATIONS FOR EXERCISING WITH TYPE 1 OR TYPE 2 DIABETES   Check your blood glucose before exercising. If blood glucose levels are greater than 240 mg/dL, check for urine ketones. Do not exercise if ketones are present.  Avoid injecting insulin into areas of the body that are going to be exercised. For example, avoid injecting insulin into:  The arms when playing tennis.  The legs when jogging.  Keep a record of:  Food intake before and after you exercise.  Expected peak times of insulin action.  Blood glucose levels before and after you exercise.  The type and amount of exercise you have done.  Review your records with your health care provider. Your health care provider will help you to develop guidelines for adjusting food intake and insulin amounts before and after exercising.  If you take insulin or oral hypoglycemic agents, watch for signs and symptoms of hypoglycemia. They include:  Dizziness.  Shaking.  Sweating.  Chills.  Confusion.  Drink plenty of water while you exercise to prevent dehydration or heat stroke. Body water is lost during exercise and must be replaced.  Talk to your health care provider before starting an exercise program to make sure it is safe for you. Remember, almost any type of activity is better than none. Document Released: 07/30/2003 Document Revised: 09/23/2013 Document Reviewed: 10/16/2012 [facility] Patient Information 2015 [place], [place]. This information is not intended to replace advice given to you by your health care provider. Make sure you discuss any questions you have with your health care provider.

## 2014-03-04 NOTE — Assessment & Plan Note (Signed)
Improving control with lifestyle changes. Get back on track with walking some. Continue current meds.

## 2014-03-11 ENCOUNTER — Other Ambulatory Visit: Payer: Self-pay | Admitting: Family Medicine

## 2014-03-14 LAB — HM DIABETES EYE EXAM

## 2014-04-11 ENCOUNTER — Other Ambulatory Visit: Payer: Self-pay | Admitting: Family Medicine

## 2014-04-14 ENCOUNTER — Encounter: Payer: Self-pay | Admitting: Family Medicine

## 2014-04-14 ENCOUNTER — Ambulatory Visit (INDEPENDENT_AMBULATORY_CARE_PROVIDER_SITE_OTHER): Payer: Medicare Other | Admitting: Family Medicine

## 2014-04-14 VITALS — BP 179/73 | HR 81 | Temp 97.9°F | Ht 67.0 in | Wt 190.2 lb

## 2014-04-14 DIAGNOSIS — Z1211 Encounter for screening for malignant neoplasm of colon: Secondary | ICD-10-CM

## 2014-04-14 DIAGNOSIS — I10 Essential (primary) hypertension: Secondary | ICD-10-CM

## 2014-04-14 DIAGNOSIS — Z0000 Encounter for general adult medical examination without abnormal findings: Secondary | ICD-10-CM

## 2014-04-14 DIAGNOSIS — N184 Chronic kidney disease, stage 4 (severe): Secondary | ICD-10-CM

## 2014-04-14 DIAGNOSIS — E1122 Type 2 diabetes mellitus with diabetic chronic kidney disease: Secondary | ICD-10-CM

## 2014-04-14 LAB — HM DIABETES FOOT EXAM

## 2014-04-14 MED ORDER — RAMIPRIL 2.5 MG PO CAPS: 2.5000 mg | ORAL_CAPSULE | Freq: Every day | ORAL | Status: DC

## 2014-04-14 NOTE — Progress Notes (Signed)
I have personally reviewed the Medicare Annual Wellness questionnaire and have noted 1.The patient's medical and social history 2.Their use of alcohol, tobacco or illicit drugs 3.Their current medications and supplements 4.The patient's functional ability including ADL's, fall risks, home safety risks and hearing or visual  impairment. 5.Diet and physical activities 6.Evidence for depression or mood disorders The patients weight, height, BMI and visual acuity have been recorded in the chart I have made referrals, counseling and provided education to the patient based review of the above and I have provided the pt with a written personalized care plan for preventive services.  She reports she is doing well overall.  Diabetes:mproved control on 70/30, 35 Units in AM and 25 Units in PM  Lab Results  Component Value Date   HGBA1C 7.3* 02/13/2014  Hypoglycemic episodes: none Hyperglycemic episodes:None  Feet problems:none, execept little black spot on left great toe Blood Sugars averaging:FBS 100s eye exam within last year:  She feels like she is doing fairly well with diet. She has lost 4 lbs.  Wt Readings from Last 3 Encounters:  04/14/14 190 lb 4 oz (86.297 kg)  03/04/14 194 lb 12 oz (88.338 kg)  11/19/13 194 lb 12 oz (88.338 kg)   Hypertension Well controlled  previously on toprol and amlodipine. Never restarted ramipril. She reports she was rushing herre today. BP Readings from Last 3 Encounters:  04/14/14 168/75  03/04/14 106/72  11/19/13 165/76  Chest pain with exertion:None  Edema:slight  Short of breath:None  Average home IEP:PIRJJOAC, not sure numbers. Other issues:   Elevated Cholesterol: At goal on lipitor and zetia  Lab Results  Component Value Date   CHOL 142 02/13/2014   HDL 37.00* 02/13/2014   LDLCALC 89 02/13/2014   TRIG 81.0 02/13/2014   CHOLHDL 4 02/13/2014  Using medications  without problems:None  Muscle aches: None  Diet compliance:fair  Exercise:None  Other complaints:   Stable chronic renal failure III: Cr baseline 2.5-2.8 Never started back on ramipril. Refuses to return to  Dr. Florene Glen nephrology, does not think she is interested in dialysis if it is needed.  Review of Systems  Constitutional: Negative for fever and fatigue.  HENT: Negative for ear pain.  Eyes: Negative for pain.  Respiratory: Negative for chest tightness and shortness of breath.  Cardiovascular: Negative for chest pain, palpitations and leg swelling.  Gastrointestinal: Negative for abdominal pain.  Genitourinary: Negative for dysuria.     reports that she quit smoking about 3 years ago. Her smoking use included Cigarettes. She has a 30 pack-year smoking history. She has never used smokeless tobacco. She reports that she does not drink alcohol or use illicit drugs.    Objective:   Physical Exam  Constitutional: Vital signs are normal. She appears well-developed and well-nourished. She is cooperative. Non-toxic appearance. She does not appear ill. No distress.  HENT:  Head: Normocephalic.  Right Ear: Hearing, tympanic membrane, external ear and ear canal normal.  Left Ear: Hearing, tympanic membrane, external ear and ear canal normal.  Nose: Nose normal.  Eyes: Conjunctivae, EOM and lids are normal. Pupils are equal, round, and reactive to light. No foreign bodies found.  Neck: Trachea normal and normal range of motion. Neck supple. Carotid bruit is not present. No mass and no thyromegaly present.  Cardiovascular: Normal rate, regular rhythm, S1 normal, S2 normal, normal heart sounds and intact distal pulses. Exam reveals no gallop.  No murmur heard. Pulmonary/Chest: Effort normal and breath sounds normal. No respiratory distress. She has  no wheezes. She has no rhonchi. She has no rales.  Abdominal: Soft. Normal appearance and bowel sounds are normal. She exhibits no  distension, no fluid wave, no abdominal bruit and no mass. There is no hepatosplenomegaly. There is no tenderness. There is no rebound, no guarding and no CVA tenderness. No hernia.  Genitourinary: No breast swelling, tenderness, discharge or bleeding.  Lymphadenopathy:   She has no cervical adenopathy.   She has no axillary adenopathy.  Neurological: She is alert. She has normal strength. No cranial nerve deficit or sensory deficit.  Skin: Skin is warm, dry and intact. No rash noted.  Psychiatric: Her speech is normal and behavior is normal. Judgment normal. Her mood appears not anxious. Cognition and memory are normal. She does not exhibit a depressed mood.   Diabetic foot exam: Normal inspection No skin breakdown No calluses  Normal DP pulses Normal sensation to light touch and monofilament Nails thickend but trimmed nicely         Assessment & Plan:  The patient's preventative maintenance and recommended screening tests for an annual wellness exam were reviewed in full today. Brought up to date unless services declined.  Counselled on the importance of diet, exercise, and its role in overall health and mortality. The patient's FH and SH was reviewed, including their home life, tobacco status, and drug and alcohol status.   Vaccines: Due for Td, PNA and shingles vaccines, refused all. Colon: Followed with ifob.. Last one 2014 DEXA:Overdue..I have recommended that this patient have a mammogram, immunization and bone density but she declines at this time. I have discussed the risks and benefits of this examination with her. The patient verbalizes understanding. At this time. Mammo: Overdue... refused PAP/DVE: Not indicated. Former smoker, asymptomatic.

## 2014-04-14 NOTE — Patient Instructions (Addendum)
Stop at lab on way out.  Restart ramiprillow dose for kidney protection. Return in 7-10 days for recheck of kidney function back on ramipril.

## 2014-04-14 NOTE — Progress Notes (Signed)
Pre visit review using our clinic review tool, if applicable. No additional management support is needed unless otherwise documented below in the visit note. 

## 2014-04-14 NOTE — Assessment & Plan Note (Signed)
Stable. Restart of ramipril daily for kidney protection. Return for recheck or cr in 2 weeks.

## 2014-04-14 NOTE — Assessment & Plan Note (Addendum)
Elevated in office today, ? Control at homeWill restart ramipril at low dose. Follow cr closely given past ipossible increase creatinine with ACEI. May need to icrease dose further for BP control.

## 2014-04-14 NOTE — Assessment & Plan Note (Signed)
Improved control.  On insulin. Encouraged exercise, weight loss, healthy eating habits.

## 2014-04-15 ENCOUNTER — Telehealth: Payer: Self-pay | Admitting: Family Medicine

## 2014-04-15 NOTE — Telephone Encounter (Signed)
emmi mailed  °

## 2014-08-12 ENCOUNTER — Ambulatory Visit: Payer: Self-pay | Admitting: Family Medicine

## 2014-08-14 ENCOUNTER — Encounter: Payer: Self-pay | Admitting: Family Medicine

## 2014-08-14 ENCOUNTER — Ambulatory Visit (INDEPENDENT_AMBULATORY_CARE_PROVIDER_SITE_OTHER): Payer: Medicare Other | Admitting: Family Medicine

## 2014-08-14 VITALS — BP 160/74 | HR 79 | Temp 98.6°F | Ht 67.0 in | Wt 191.5 lb

## 2014-08-14 DIAGNOSIS — N950 Postmenopausal bleeding: Secondary | ICD-10-CM | POA: Insufficient documentation

## 2014-08-14 LAB — POCT URINALYSIS DIPSTICK
BILIRUBIN UA: NEGATIVE
GLUCOSE UA: NEGATIVE
Ketones, UA: NEGATIVE
Leukocytes, UA: NEGATIVE
NITRITE UA: NEGATIVE
SPEC GRAV UA: 1.025
Urobilinogen, UA: 0.2
pH, UA: 6

## 2014-08-14 NOTE — Progress Notes (Signed)
Pre visit review using our clinic review tool, if applicable. No additional management support is needed unless otherwise documented below in the visit note. 

## 2014-08-14 NOTE — Assessment & Plan Note (Addendum)
No sign of source from rectum or urinary. Visualized bloody tan discharge in vaginal vault. ? Mass vascular seen in cervical os... ? Coming through or part of, ? Polyp vs carcinoma.  Start with pelvic and transvagf US. Will refer as appropriate from there.

## 2014-08-14 NOTE — Progress Notes (Signed)
   Subjective:    Patient ID: Doris [name], female    DOB: June 04, 1932, 81 y.o.   MRN: [id number]  HPI   81 year old post menopausal femaleDM, CK presents for new onset vaginal bleeding. She has noted with 1 month of intermittent red blood on toilet tissue, now has changed to light tan. Notes on panty liner as well.  No blood in urine, no blood in stool. No constipation, no diarrhea, no fever, dysuria. No abdominal pain or cramping     Review of Systems  Constitutional: Negative for fever and fatigue.  HENT: Negative for ear pain.   Eyes: Negative for pain.  Respiratory: Negative for chest tightness and shortness of breath.   Cardiovascular: Negative for chest pain, palpitations and leg swelling.  Gastrointestinal: Negative for abdominal pain.  Genitourinary: Negative for dysuria.       Objective:   Physical Exam  Constitutional: Vital signs are normal. She appears well-developed and well-nourished. She is cooperative.  Non-toxic appearance. She does not appear ill. No distress.  HENT:  Head: Normocephalic.  Right Ear: Hearing, tympanic membrane, external ear and ear canal normal. Tympanic membrane is not erythematous, not retracted and not bulging.  Left Ear: Hearing, tympanic membrane, external ear and ear canal normal. Tympanic membrane is not erythematous, not retracted and not bulging.  Nose: No mucosal edema or rhinorrhea. Right sinus exhibits no maxillary sinus tenderness and no frontal sinus tenderness. Left sinus exhibits no maxillary sinus tenderness and no frontal sinus tenderness.  Mouth/Throat: Uvula is midline, oropharynx is clear and moist and mucous membranes are normal.  Eyes: Conjunctivae, EOM and lids are normal. Pupils are equal, round, and reactive to light. Lids are everted and swept, no foreign bodies found.  Neck: Trachea normal and normal range of motion. Neck supple. Carotid bruit is not present. No thyroid mass and no thyromegaly present.    Cardiovascular: Normal rate, regular rhythm, S1 normal, S2 normal, normal heart sounds, intact distal pulses and normal pulses.  Exam reveals no gallop and no friction rub.   No murmur heard. Pulmonary/Chest: Effort normal and breath sounds normal. No tachypnea. No respiratory distress. She has no decreased breath sounds. She has no wheezes. She has no rhonchi. She has no rales.  Abdominal: Soft. Normal appearance and bowel sounds are normal. She exhibits no distension. There is no tenderness.  Low abdominal fullness, no mass or uterine enlargement clearly felt but pt with central obesity.  Genitourinary: Rectum normal. Rectal exam shows no external hemorrhoid and no fissure. Right adnexum displays no mass, no tenderness and no fullness. Left adnexum displays no mass, no tenderness and no fullness.  Difficult to palpate uterus Visualized bloody tan discharge in vaginal vault. ? Mass vascular seen in cervical os... ? Coming through or part of, ? Polyp vs carcinoma. .  Neurological: She is alert.  Skin: Skin is warm, dry and intact. No rash noted.  Psychiatric: Her speech is normal and behavior is normal. Judgment and thought content normal. Her mood appears not anxious. Cognition and memory are normal. She does not exhibit a depressed mood.          Assessment & Plan:

## 2014-08-14 NOTE — Addendum Note (Signed)
Addended byEliezer Lofts E on: 08/14/2014 11:17 AM   Modules accepted: Orders

## 2014-08-14 NOTE — Patient Instructions (Signed)
Stop  at front desk to set up ultrasound.

## 2014-08-22 ENCOUNTER — Ambulatory Visit
Admission: RE | Admit: 2014-08-22 | Discharge: 2014-08-22 | Disposition: A | Discharge status: Home / Self Care | Payer: Medicare Other | Source: Ambulatory Visit | Attending: Family Medicine | Admitting: Family Medicine

## 2014-08-22 DIAGNOSIS — N950 Postmenopausal bleeding: Secondary | ICD-10-CM

## 2014-08-26 ENCOUNTER — Telehealth: Payer: Self-pay | Admitting: Family Medicine

## 2014-08-26 DIAGNOSIS — N950 Postmenopausal bleeding: Secondary | ICD-10-CM

## 2014-08-26 DIAGNOSIS — R935 Abnormal findings on diagnostic imaging of other abdominal regions, including retroperitoneum: Secondary | ICD-10-CM

## 2014-08-26 NOTE — Telephone Encounter (Signed)
Discussed with pt in detail.  Will move ahead with semi urgent referral to GYN for endometrial biopsy.

## 2014-08-28 NOTE — Telephone Encounter (Signed)
Appt made with [facility], patient notified.

## 2014-09-09 ENCOUNTER — Other Ambulatory Visit: Payer: Medicare Other

## 2014-09-09 ENCOUNTER — Telehealth: Payer: Self-pay | Admitting: Family Medicine

## 2014-09-09 ENCOUNTER — Other Ambulatory Visit (INDEPENDENT_AMBULATORY_CARE_PROVIDER_SITE_OTHER): Payer: Medicare Other

## 2014-09-09 DIAGNOSIS — N184 Chronic kidney disease, stage 4 (severe): Secondary | ICD-10-CM

## 2014-09-09 DIAGNOSIS — E785 Hyperlipidemia, unspecified: Secondary | ICD-10-CM | POA: Diagnosis not present

## 2014-09-09 DIAGNOSIS — D631 Anemia in chronic kidney disease: Secondary | ICD-10-CM

## 2014-09-09 DIAGNOSIS — E1122 Type 2 diabetes mellitus with diabetic chronic kidney disease: Secondary | ICD-10-CM

## 2014-09-09 LAB — COMPREHENSIVE METABOLIC PANEL
ALT: 11 U/L (ref 0–35)
AST: 8 U/L (ref 0–37)
Albumin: 4.1 g/dL (ref 3.5–5.2)
Alkaline Phosphatase: 90 U/L (ref 39–117)
BILIRUBIN TOTAL: 0.5 mg/dL (ref 0.2–1.2)
BUN: 52 mg/dL — ABNORMAL HIGH (ref 6–23)
CO2: 28 mEq/L (ref 19–32)
CREATININE: 2.88 mg/dL — AB (ref 0.40–1.20)
Calcium: 9.6 mg/dL (ref 8.4–10.5)
Chloride: 106 mEq/L (ref 96–112)
GFR: 20.25 mL/min — ABNORMAL LOW (ref >60.00)
GLUCOSE: 139 mg/dL — AB (ref 70–99)
Potassium: 3.9 mEq/L (ref 3.5–5.1)
Sodium: 141 mEq/L (ref 135–145)
Total Protein: 7.7 g/dL (ref 6.0–8.3)

## 2014-09-09 LAB — CBC WITH DIFFERENTIAL/PLATELET
Basophils Absolute: 0 10*3/uL (ref 0.0–0.1)
Basophils Relative: 0.4 % (ref 0.0–3.0)
Eosinophils Absolute: 0.3 10*3/uL (ref 0.0–0.7)
Eosinophils Relative: 2.1 % (ref 0.0–5.0)
HCT: 33.7 % — ABNORMAL LOW (ref 36.0–46.0)
Hemoglobin: 10.9 g/dL — ABNORMAL LOW (ref 12.0–15.0)
Lymphocytes Relative: 18.2 % (ref 12.0–46.0)
Lymphs Abs: 2.2 10*3/uL (ref 0.7–4.0)
MCHC: 32.4 g/dL (ref 30.0–36.0)
MCV: 73.4 fl — ABNORMAL LOW (ref 78.0–100.0)
Monocytes Absolute: 0.9 10*3/uL (ref 0.1–1.0)
Monocytes Relative: 7.3 % (ref 3.0–12.0)
NEUTROS ABS: 8.6 10*3/uL — AB (ref 1.4–7.7)
Neutrophils Relative %: 72 % (ref 43.0–77.0)
Platelets: 384 10*3/uL (ref 150.0–400.0)
RBC: 4.6 Mil/uL (ref 3.87–5.11)
RDW: 18.6 % — ABNORMAL HIGH (ref 11.5–15.5)
WBC: 11.9 10*3/uL — AB (ref 4.0–10.5)

## 2014-09-09 LAB — VITAMIN D 25 HYDROXY (VIT D DEFICIENCY, FRACTURES): VITD: 43.46 ng/mL (ref 30.00–100.00)

## 2014-09-09 LAB — LIPID PANEL
CHOLESTEROL: 157 mg/dL (ref 0–200)
HDL: 39.9 mg/dL (ref >39.00)
LDL Cholesterol: 97 mg/dL (ref 0–99)
NonHDL: 117.1
TRIGLYCERIDES: 103 mg/dL (ref 0.0–149.0)
Total CHOL/HDL Ratio: 4
VLDL: 20.6 mg/dL (ref 0.0–40.0)

## 2014-09-09 LAB — HEMOGLOBIN A1C: Hgb A1c MFr Bld: 6.7 % — ABNORMAL HIGH (ref 4.6–6.5)

## 2014-09-09 NOTE — Telephone Encounter (Signed)
-----   Message from Ellamae Sia sent at 09/04/2014  2:58 PM EDT ----- Regarding: Lab orders for Tuesday, 4.19.16 Patient is scheduled for CPX labs, please order future labs, Thanks , Karna Christmas

## 2014-09-10 LAB — HM DIABETES EYE EXAM

## 2014-09-11 ENCOUNTER — Other Ambulatory Visit: Payer: Self-pay | Admitting: *Deleted

## 2014-09-11 MED ORDER — AMLODIPINE BESYLATE 10 MG PO TABS: 10.0000 mg | ORAL_TABLET | Freq: Every day | ORAL | Status: AC

## 2014-09-11 MED ORDER — METOPROLOL SUCCINATE ER 200 MG PO TB24: ORAL_TABLET | ORAL | Status: AC

## 2014-09-11 MED ORDER — HYDROCHLOROTHIAZIDE 25 MG PO TABS: ORAL_TABLET | ORAL | Status: AC

## 2014-09-16 ENCOUNTER — Ambulatory Visit (INDEPENDENT_AMBULATORY_CARE_PROVIDER_SITE_OTHER): Payer: Medicare Other | Admitting: Family Medicine

## 2014-09-16 ENCOUNTER — Encounter: Payer: Self-pay | Admitting: Family Medicine

## 2014-09-16 VITALS — BP 144/64 | HR 82 | Temp 98.6°F | Ht 67.0 in | Wt 193.0 lb

## 2014-09-16 DIAGNOSIS — N184 Chronic kidney disease, stage 4 (severe): Secondary | ICD-10-CM

## 2014-09-16 DIAGNOSIS — Z7189 Other specified counseling: Secondary | ICD-10-CM

## 2014-09-16 DIAGNOSIS — I10 Essential (primary) hypertension: Secondary | ICD-10-CM | POA: Diagnosis not present

## 2014-09-16 DIAGNOSIS — Z0000 Encounter for general adult medical examination without abnormal findings: Secondary | ICD-10-CM | POA: Diagnosis not present

## 2014-09-16 DIAGNOSIS — E1122 Type 2 diabetes mellitus with diabetic chronic kidney disease: Secondary | ICD-10-CM | POA: Diagnosis not present

## 2014-09-16 DIAGNOSIS — E785 Hyperlipidemia, unspecified: Secondary | ICD-10-CM | POA: Diagnosis not present

## 2014-09-16 LAB — HM DIABETES FOOT EXAM

## 2014-09-16 NOTE — Assessment & Plan Note (Signed)
Improved control on current med. Some lows.. Will decrease 70/30 some.

## 2014-09-16 NOTE — Patient Instructions (Addendum)
Restart iron for mild anemia. Decrease insulin to 25 in AM and 20 units in PM.  Return stool cards  If able.

## 2014-09-16 NOTE — Assessment & Plan Note (Signed)
Well controlled. Continue current medication.  

## 2014-09-16 NOTE — Progress Notes (Signed)
Pre visit review using our clinic review tool, if applicable. No additional management support is needed unless otherwise documented below in the visit note. 

## 2014-09-16 NOTE — Progress Notes (Signed)
HPI  I have personally reviewed the Medicare Annual Wellness questionnaire and have noted 1.The patient's medical and social history 2.Their use of alcohol, tobacco or illicit drugs 3.Their current medications and supplements 4.The patient's functional ability including ADL's, fall risks, home safety risks and hearing or visual  impairment. 5.Diet and physical activities 6.Evidence for depression or mood disorders 7.provider list  The patients weight, height, BMI and visual acuity have been recorded in the chart I have made referrals, counseling and provided education to the patient based review of the above and I have provided the pt with a written personalized care plan for preventive services.  Pt diagnosed yesterday with endometrial cancer in work up for post menopausal bleeding. Referred to GYN ONC.  She is handling  diagnosis well.  Anemia, iron def: not on iron.  Diabetes:  Improved control on current regimen!  70/30 25 units in AM and 20 units at PM. Lab Results  Component Value Date   HGBA1C 6.7* 09/09/2014  Hypoglycemic episodes: none Hypoglycemic episodes: occ if lets less and takes too much insulin.Hyperglycemic episodes: Feet problems: None Blood Sugars averaging: FBS 90-130,  eye exam within last year:last week  Wt Readings from Last 3 Encounters:  09/16/14 193 lb (87.544 kg)  08/14/14 191 lb 8 oz (86.864 kg)  04/14/14 190 lb 4 oz (86.297 kg)   Hypertension Well controlled on toprol and amlodipine. Never restarted ramipril.  BP Readings from Last 3 Encounters:  09/16/14 144/64  08/14/14 160/74  04/14/14 179/73  Chest pain with exertion:None  Edema:slight  Short of breath:None  Average home BPs:not checking.  Other issues:   Elevated Cholesterol: At goal on lipitor and zetia Goal < 100. Lab Results  Component Value Date   CHOL 157 09/09/2014   HDL 39.90 09/09/2014   LDLCALC 97  09/09/2014   TRIG 103.0 09/09/2014   CHOLHDL 4 09/09/2014  Using medications without problems:None  Muscle aches: None  Diet compliance:fair  Exercise:None  Other complaints:   Acute on chronic renal failure III: Cr  2.5 to 2.8 baseline Never started back on ramipril. She has not been drinking much fluids. Has not returned to see Dr. Florene Glen, nephrology.   Review of Systems  Constitutional: Negative for fever and fatigue.  HENT: Negative for ear pain.  Eyes: Negative for pain.  Respiratory: Negative for chest tightness and shortness of breath.  Cardiovascular: Negative for chest pain, palpitations and leg swelling.  Gastrointestinal: Negative for abdominal pain.  Genitourinary: Negative for dysuria.       Objective:   Physical Exam  Constitutional: Vital signs are normal. She appears well-developed and well-nourished. She is cooperative. Non-toxic appearance. She does not appear ill. No distress.  HENT:  Head: Normocephalic.  Right Ear: Hearing, tympanic membrane, external ear and ear canal normal.  Left Ear: Hearing, tympanic membrane, external ear and ear canal normal.  Nose: Nose normal.  Eyes: Conjunctivae, EOM and lids are normal. Pupils are equal, round, and reactive to light. No foreign bodies found.  Neck: Trachea normal and normal range of motion. Neck supple. Carotid bruit is not present. No mass and no thyromegaly present.  Cardiovascular: Normal rate, regular rhythm, S1 normal, S2 normal, normal heart sounds and intact distal pulses. Exam reveals no gallop.  No murmur heard. Pulmonary/Chest: Effort normal and breath sounds normal. No respiratory distress. She has no wheezes. She has no rhonchi. She has no rales.  Abdominal: Soft. Normal appearance and bowel sounds are normal. She exhibits no distension, no fluid wave,  no abdominal bruit and no mass. There is no hepatosplenomegaly. There is no tenderness. There is no rebound, no guarding and no CVA  tenderness. No hernia.  Genitourinary: No breast swelling, tenderness, discharge or bleeding.  Lymphadenopathy:   She has no cervical adenopathy.   She has no axillary adenopathy.  Neurological: She is alert. She has normal strength. No cranial nerve deficit or sensory deficit.  Skin: Skin is warm, dry and intact. No rash noted.  Psychiatric: Her speech is normal and behavior is normal. Judgment normal. Her mood appears not anxious. Cognition and memory are normal. She does not exhibit a depressed mood.   Diabetic foot exam: Normal inspection No skin breakdown No calluses  Normal DP pulses Normal sensation to light touch and monofilament Nails thickend but trimmed nicely    Brief MMSE: diminished short term memory, missed one letter order in spelling world.  Decrease hearing in left ear. Not bothering her at all.       Assessment & Plan:  The patient's preventative maintenance and recommended screening tests for an annual wellness exam were reviewed in full today. Brought up to date unless services declined.  Counselled on the importance of diet, exercise, and its role in overall health and mortality. The patient's FH and SH was reviewed, including their home life, tobacco status, and drug and alcohol status.   Vaccines: Due for Td, PNA and shingles vaccines, refused all. Colon: Followed with ifob.. Last one 2012. Did not return last years. Will consider in future. DEXA: Not interested Mammo: not indicated Non  smoker

## 2014-09-18 ENCOUNTER — Ambulatory Visit: Payer: Medicare Other | Attending: Gynecologic Oncology | Admitting: Gynecologic Oncology

## 2014-09-18 ENCOUNTER — Encounter: Payer: Self-pay | Admitting: Gynecologic Oncology

## 2014-09-18 VITALS — BP 178/72 | HR 82 | Temp 98.2°F | Resp 20 | Ht 67.91 in | Wt 193.6 lb

## 2014-09-18 DIAGNOSIS — I10 Essential (primary) hypertension: Secondary | ICD-10-CM

## 2014-09-18 DIAGNOSIS — N189 Chronic kidney disease, unspecified: Secondary | ICD-10-CM | POA: Diagnosis not present

## 2014-09-18 DIAGNOSIS — C541 Malignant neoplasm of endometrium: Secondary | ICD-10-CM | POA: Insufficient documentation

## 2014-09-18 DIAGNOSIS — Z8041 Family history of malignant neoplasm of ovary: Secondary | ICD-10-CM

## 2014-09-18 DIAGNOSIS — Z8049 Family history of malignant neoplasm of other genital organs: Secondary | ICD-10-CM

## 2014-09-18 DIAGNOSIS — M13869 Other specified arthritis, unspecified knee: Secondary | ICD-10-CM

## 2014-09-18 DIAGNOSIS — E119 Type 2 diabetes mellitus without complications: Secondary | ICD-10-CM | POA: Diagnosis not present

## 2014-09-18 DIAGNOSIS — Z803 Family history of malignant neoplasm of breast: Secondary | ICD-10-CM

## 2014-09-18 NOTE — Addendum Note (Signed)
Addended by: Christa See on: 09/18/2014 04:10 PM   Modules accepted: Orders

## 2014-09-18 NOTE — Patient Instructions (Signed)
CT scan at [facility] on Tuesday, May 3rd. Plan for surgery at [facility] with Dr. Alycia Rossetti. You will receive a phone call with date/time for a pre-op appt at [facility].

## 2014-09-18 NOTE — Progress Notes (Signed)
Consult Note: Gyn-Onc  Doris [name] 81 y.o. female  CC:  Chief Complaint  Patient presents with  . endometrial cancer    HPI: Patient is seen today in consultation at the request of Dr. Radene Knee for a new diagnosis of endometrial cancer. Primary physician Dr. Eliezer Lofts  Patient is a 81 year old gravida 4 para 3 aborta 1 L2 who has been menopausal since 1984. She specifically remembers as stated she just moved back to [place] from [place]. She never took any hormone replacement therapy. She had approximately 6 week history of intermittent red blood on toilet tissue that then changed to a light tan discharge. An ultrasound was obtained on April 1 revealed the uterus to be 10.5 x 5.5 x 6.3 cm it to uterine fibroids the largest of which was 1.9 cm. The endometrium was thickened to 2.8 cm with increased vascularity. The right ovary was normal in appearance as was the left ovary. However, on the left ovary there was a thick walled cystic lesion measuring 1.3 cm. She was then referred to Dr. Radene Knee. She had a repeat ultrasound done on April 23 revealed the endometrial stripe thickening to be 3.2 cm. There was a large hypoechoic mass seen within the endometrium into the cervix which measured 7 x 3 cm. There multiple small cysts seen on the ovaries. Endometrial biopsy was performed that revealed a high-grade endometrial carcinoma with papillary architecture and serous features. Cervical polyp removal revealed fragments suggestive of a nabothian cyst with no malignancy identified.  She's overall been in her usual state of health other than spotting and bleeding. She has a history of back problems in the past as received steroid injections to the back but stopped doing nose as those have not helped. She is rather sedentary secondary to her knee pain. She's able to walk on flat surfaces fine however she finds it difficult to climb a  flight of stairs secondary to her knee pain. They hurt when she bends in.  She's not been seen by anybody for this specific issue. She quit smoking about 5 years ago. Overall she smoked about half a pack a day for greater than 50 years. Her family history significant in that she had a daughter who had ovarian cancer in her 8s. She had a first cousin on her mother's side questionable liver cancer. She has one first cousin with breast cancer in 2 maternal aunts with breast cancer. There is never been any genetic testing that she is aware of in the family. She has a history of diabetes and chronic renal insufficiency with her last creatinine being 2.88.  Review of Systems  Constitutional: Denies fever. Skin: + rash on abdomen since being diagnosed with diabetes Cardiovascular: No chest pain, shortness of breath, or edema  Pulmonary: No cough  Gastro Intestinal: No nausea, vomiting, constipation, or diarrhea reported. No bright red blood per rectum or change in bowel movement.  Genitourinary: No frequency, urgency, or dysuria.  +vaginal bleeding and discharge.  Musculoskeletal: + low back and bilateral knee pain Neurologic: No change in gait.  Psychology: No changes    Current Meds:  Outpatient Encounter Prescriptions as of 09/18/2014  Medication Sig  . amLODipine (NORVASC) 10 MG tablet Take 1 tablet (10 mg total) by mouth daily.  Marland Kitchen atorvastatin (LIPITOR) 80 MG tablet TAKE ONE TABLET BY MOUTH ONCE DAILY  . COMBIGAN 0.2-0.5 % ophthalmic solution   . hydrochlorothiazide (HYDRODIURIL) 25 MG tablet TAKE ONE TABLET BY MOUTH EVERY DAY  . insulin NPH-regular Human (  NOVOLIN 70/30) (70-30) 100 UNIT/ML injection Inject 25 units in AM and 20 UNits in PM with meals.  . Iron-Vitamins (GERITOL COMPLETE PO) Take 1 capsule by mouth daily.    . Lancets MISC by Does not apply route.    . metoprolol (TOPROL-XL) 200 MG 24 hr tablet TAKE ONE TABLET BY MOUTH ONCE DAILY  . ONE TOUCH ULTRA TEST test strip USE ONE TEST STRIP TO CHECK BLOOD SUGAR EVERY DAY  . prednisoLONE acetate (PRED FORTE)  1 % ophthalmic suspension   . ramipril (ALTACE) 2.5 MG capsule Take 1 capsule (2.5 mg total) by mouth daily.  Marland Kitchen VIGAMOX 0.5 % ophthalmic solution     Allergy: No Known Allergies  Social Hx:   History   Social History  . Marital Status: Married    Spouse Name: N/A  . Number of Children: N/A  . Years of Education: N/A   Occupational History  . Not on file.   Social History Main Topics  . Smoking status: Former Smoker -- 0.50 packs/day for 60 years    Types: Cigarettes    Quit date: 05/23/2010  . Smokeless tobacco: Never Used  . Alcohol Use: No  . Drug Use: No  . Sexual Activity: Not on file   Other Topics Concern  . Not on file   Social History Narrative   Exercise: Walking irregularly.   Diet: trying to improved   No living will, no HCPOA    Past Surgical Hx:  Past Surgical History  Procedure Laterality Date  . Appendectomy    . Spine surgery    . Tonsillectomy      Past Medical Hx:  Past Medical History  Diagnosis Date  . Diabetes mellitus   . Hyperlipidemia   . Hypertension   . Arthritis   . Seizures     Oncology Hx:    Endometrial cancer   09/10/2014 Initial Diagnosis Endometrial cancer    Family Hx: History reviewed. No pertinent family history.  Vitals:  Blood pressure 178/72, pulse 82, temperature 98.2 F (36.8 C), temperature source Oral, resp. rate 20, height 5' 7.91" (1.725 m), weight 193 lb 9.6 oz (87.816 kg).  Physical Exam: Well-nourished well-developed female in no acute distress.  Neck: Supple, no lymphadenopathy, no thyromegaly.  Lungs: Clear to auscultation bilaterally. Cardiovascular regular rate and rhythm. Abdomen well-healed right lower quadrant surgical incision.  Abdomen: Soft, nontender, no nondistended. There is no fluid wave. Abdomen is very protuberant with central obesity. There is no hepatosplenomegaly but exam is limited by habitus.  Groins: No lymphadenopathy.  Extremity: No edema.  Pelvic: External genitalia  within normal limits though atrophic. The vagina is atrophic. The cervix is multiparous. There are 2 separate lesions arising from the cervix. There is a fleshy polypoid-looking lesion on the superior aspect of the cervix and a smaller cystic lesion on the inferior aspect. The polypoid lesion measures approximately 1 cm as does the cystic lesion. After obtaining the patient's verbal consent the polypoid lesion was biopsied. Hemostasis was obtained with silver nitrate. Bimanual examination the cervix is palpably normal with the exception of the polypoid lesion. The lower uterine segment is distended to approximate 10 cm. The uterus is mobile. It is difficult to ascertain the typical size of the uterus secondary to her central obesity, however the lower uterine segment is distended. There are no obvious adnexal masses. There is no nodularity. Rectal confirms.  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 The patient has multiple medical issues  including hypertension diabetes and chronic renal insufficiency with a baseline creatinine of 2.88. Her METs are difficult to ascertain as she cannot go up a flight of stairs stairs secondary to arthritis in her knees.  1) I will contact her primary care physician to see if she can assist  [place] with preoperative risk stratification. I'm not sure if the patient requires a stress test in their opinion.   2) We will obtain a noncontrast CT to evaluate for any evidence of metastatic disease. I do believe that the surgery will need to be done via a vertical midline incision. I do not believe that this she would be a robotic candidate secondary to this distention of the lower uterine segment. I discussed this with her and her children. They're amenable to the surgery being done at [facility]. The patient does have an 82th birthday trip planned to [facility] in the mid part of June and that is the priority for them. Therefore, we will  stratification her surgery scheduled with the next available surgical date to allow for adequate recovery before her trip.  3) There appears to be significant family history of breast uterine and ovarian cancer. I discussed with the patient that we should proceed with genetic counseling and subsequent testing. She seems open to this.  4) We did discuss that was serous carcinoma as well for need to proceed with adjuvant therapy even in the setting of stage I disease. They're open to considering this possibility if indicated.  With regards to information dissemination we'll call the patient at her home number. Her son is the backup and his number is [phone]  [facility] MD 09/18/2014, 11:28 AM

## 2014-09-19 ENCOUNTER — Telehealth: Payer: Self-pay | Admitting: Family Medicine

## 2014-09-19 DIAGNOSIS — E1122 Type 2 diabetes mellitus with diabetic chronic kidney disease: Secondary | ICD-10-CM

## 2014-09-19 DIAGNOSIS — N184 Chronic kidney disease, stage 4 (severe): Secondary | ICD-10-CM

## 2014-09-19 DIAGNOSIS — E785 Hyperlipidemia, unspecified: Secondary | ICD-10-CM

## 2014-09-19 DIAGNOSIS — Z01818 Encounter for other preprocedural examination: Secondary | ICD-10-CM

## 2014-09-19 DIAGNOSIS — C541 Malignant neoplasm of endometrium: Secondary | ICD-10-CM

## 2014-09-19 NOTE — Telephone Encounter (Signed)
-----   Message from Carter Kitten, [place] sent at 09/19/2014  3:42 PM EDT ----- Spoke with Ms. Carlota Raspberry.  She is agreeable with the Cardiology referral but wanted to let you know that they already have her scheduled for surgery on Sep 30, 2014.  Butch Penny ----- Message -----    From: Jinny Sanders, MD    Sent: 09/19/2014   3:08 PM      To: Carter Kitten, CMA    ----- Message -----    From: Nancy Marus, MD    Sent: 09/18/2014  11:53 AM      To: Jinny Sanders, MD

## 2014-09-19 NOTE — Progress Notes (Signed)
Please let pt know I would recommend cardiology referral for pro op eval risk stratification prior to her GYN surgery. Let me know if agreeable and I can put in cards referral.

## 2014-09-19 NOTE — Progress Notes (Signed)
Referral made 

## 2014-09-22 ENCOUNTER — Ambulatory Visit: Payer: Medicare Other | Admitting: Gynecologic Oncology

## 2014-09-23 ENCOUNTER — Ambulatory Visit (INDEPENDENT_AMBULATORY_CARE_PROVIDER_SITE_OTHER): Payer: Medicare Other | Admitting: Cardiovascular Disease

## 2014-09-23 ENCOUNTER — Ambulatory Visit (HOSPITAL_COMMUNITY)
Admission: RE | Admit: 2014-09-23 | Discharge: 2014-09-23 | Disposition: A | Discharge status: Home / Self Care | Payer: Medicare Other | Source: Ambulatory Visit | Attending: Gynecologic Oncology | Admitting: Gynecologic Oncology

## 2014-09-23 ENCOUNTER — Encounter (HOSPITAL_COMMUNITY): Payer: Self-pay

## 2014-09-23 ENCOUNTER — Encounter: Payer: Self-pay | Admitting: Cardiovascular Disease

## 2014-09-23 VITALS — BP 142/62 | HR 76 | Ht 67.0 in | Wt 193.8 lb

## 2014-09-23 DIAGNOSIS — C541 Malignant neoplasm of endometrium: Secondary | ICD-10-CM | POA: Diagnosis present

## 2014-09-23 DIAGNOSIS — Z01810 Encounter for preprocedural cardiovascular examination: Secondary | ICD-10-CM

## 2014-09-23 DIAGNOSIS — I10 Essential (primary) hypertension: Secondary | ICD-10-CM | POA: Diagnosis not present

## 2014-09-23 NOTE — Patient Instructions (Signed)
You are at low risk for surgery from a cardiac standpoint.   Follow up as needed.

## 2014-09-23 NOTE — Assessment & Plan Note (Signed)
The patient is scheduled to have hysterectomy done. She has no previous cardiac history but has multiple risk factors for coronary artery disease. She has no cardiac symptoms and physical exam does not reveal any significant murmurs. Functional capacity is greater than 4 METs. ECG with no evidence of prior infarcts and nonspecific T wave changes. Based on the above, the patient is considered at low risk for surgery from a cardiac standpoint. Stress testing is not indicated.

## 2014-09-23 NOTE — Assessment & Plan Note (Signed)
Blood pressure is reasonably controlled on current medications. 

## 2014-09-23 NOTE — Progress Notes (Signed)
Primary care physician: Dr. Diona Browner  HPI   This is a pleasant 81 year old African-American Doris [name] who was referred for preoperative cardiovascular evaluation before hysterectomy. She was recently diagnosed with endometrial cancer. She has no previous cardiac history. However, she has multiple chronic medical conditions that include diabetes mellitus, hypertension, hyperlipidemia and previous tobacco use. She denies any chest pain or shortness of breath. She describes chronic fatigue with no recent worsening. She is independent with activities of daily living and continues to drive. She does her own shopping and housework without significant exertional symptoms. There is no family history of coronary artery disease.  No Known Allergies   Current Outpatient Prescriptions on File Prior to Visit  Medication Sig Dispense Refill  . amLODipine (NORVASC) 10 MG tablet Take 1 tablet (10 mg total) by mouth daily. 90 tablet 3  . atorvastatin (LIPITOR) 80 MG tablet TAKE ONE TABLET BY MOUTH ONCE DAILY 30 tablet 10  . COMBIGAN 0.2-0.5 % ophthalmic solution     . hydrochlorothiazide (HYDRODIURIL) 25 MG tablet TAKE ONE TABLET BY MOUTH EVERY DAY 90 tablet 3  . insulin NPH-regular Human (NOVOLIN 70/30) (70-30) 100 UNIT/ML injection Inject 25 units in AM and 20 UNits in PM with meals.    . Iron-Vitamins (GERITOL COMPLETE PO) Take 1 capsule by mouth daily.      . Lancets MISC by Does not apply route.      . metoprolol (TOPROL-XL) 200 MG 24 hr tablet TAKE ONE TABLET BY MOUTH ONCE DAILY 90 tablet 3  . ONE TOUCH ULTRA TEST test strip USE ONE TEST STRIP TO CHECK BLOOD SUGAR EVERY DAY 50 each 3  . prednisoLONE acetate (PRED FORTE) 1 % ophthalmic suspension     . VIGAMOX 0.5 % ophthalmic solution      No current facility-administered medications on file prior to visit.     Past Medical History  Diagnosis Date  . Diabetes mellitus   . Hyperlipidemia   . Hypertension   . Arthritis   . Seizures   . Enlarged  heart   . Endometrial cancer      Past Surgical History  Procedure Laterality Date  . Appendectomy    . Spine surgery    . Tonsillectomy       Family History  Problem Relation Age of Onset  . Hypertension Mother   . Heart attack Father      History   Social History  . Marital Status: Married    Spouse Name: N/A  . Number of Children: N/A  . Years of Education: N/A   Occupational History  . Not on file.   Social History Main Topics  . Smoking status: Former Smoker -- 0.50 packs/day for 60 years    Types: Cigarettes    Quit date: 05/23/2010  . Smokeless tobacco: Never Used  . Alcohol Use: No  . Drug Use: No  . Sexual Activity: Not on file   Other Topics Concern  . Not on file   Social History Narrative   Exercise: Walking irregularly.   Diet: trying to improved   No living will, no HCPOA     ROS A 10 point review of system was performed. It is negative other than that mentioned in the history of present illness.   PHYSICAL EXAM   BP 142/62 mmHg  Pulse 76  Ht 5\' 7"  (1.702 m)  Wt 193 lb 12 oz (87.884 kg)  BMI 30.34 kg/m2 Constitutional: She is oriented to person, place, and time. She appears well-developed  and well-nourished. No distress.  HENT: No nasal discharge.  Head: Normocephalic and atraumatic.  Eyes: Pupils are equal and round. No discharge.  Neck: Normal range of motion. Neck supple. No JVD present. No thyromegaly present.  Cardiovascular: Normal rate, regular rhythm, normal heart sounds. Exam reveals no gallop and no friction rub. No murmur heard.  Pulmonary/Chest: Effort normal and breath sounds normal. No stridor. No respiratory distress. She has no wheezes. She has no rales. She exhibits no tenderness.  Abdominal: Soft. Bowel sounds are normal. She exhibits no distension. There is no tenderness. There is no rebound and no guarding.  Musculoskeletal: Normal range of motion. She exhibits no edema and no tenderness.  Neurological: She is  alert and oriented to person, place, and time. Coordination normal.  Skin: Skin is warm and dry. No rash noted. She is not diaphoretic. No erythema. No pallor.  Psychiatric: She has a normal mood and affect. Her behavior is normal. Judgment and thought content normal.     YYT:KPTWS  Rhythm  -  Nonspecific T-abnormality.   ABNORMAL    ASSESSMENT AND PLAN

## 2014-09-30 HISTORY — PX: OMENTECTOMY: SHX2098

## 2014-09-30 HISTORY — PX: TOTAL ABDOMINAL HYSTERECTOMY W/ BILATERAL SALPINGOOPHORECTOMY: SHX83

## 2014-10-08 ENCOUNTER — Ambulatory Visit: Payer: Medicare Other | Attending: Gynecologic Oncology | Admitting: Gynecologic Oncology

## 2014-10-08 VITALS — BP 171/69 | HR 86 | Temp 98.0°F | Resp 19 | Ht 67.0 in | Wt 189.6 lb

## 2014-10-08 DIAGNOSIS — C541 Malignant neoplasm of endometrium: Secondary | ICD-10-CM | POA: Diagnosis not present

## 2014-10-08 NOTE — Patient Instructions (Signed)
The steri strips on your incision will stay in place for one week.  If they are still present after one week, you may remove them.  Wash your incision daily and pat dry.  We will arrange for you to meet with Dr. Evlyn Clines to discuss chemotherapy.  Please call for any questions or concerns.

## 2014-10-10 ENCOUNTER — Telehealth: Payer: Self-pay | Admitting: *Deleted

## 2014-10-10 NOTE — Telephone Encounter (Signed)
Called pt to notify of scheduled appointments, spoke with the patient and her daughter Trenna Kiely) and gave them appointment times and dates. Pt and daughter agreed with all appointments scheduled om May 25 & 27

## 2014-10-10 NOTE — Progress Notes (Signed)
Follow Up Note: Gyn-Onc  Doris [name] 81 y.o. female  CC:  Chief Complaint  Patient presents with  . Endometrial Cancer    Follow up    HPI:  Doris [name] is a 81 year old female seen initially for newly diagnosed endometrial cancer from Dr. Radene Knee.  She presented with approximately a 6 week history of intermittent red blood on toilet tissue that then changed to a light tan discharge. An ultrasound was obtained on April 1 that revealed the uterus to be 10.5 x 5.5 x 6.3 cm with the endometrium thickened to 2.8 cm with increased vascularity. The right ovary was normal in appearance as was the left ovary. However, on the left ovary there was a thick walled cystic lesion measuring 1.3 cm. She had a repeat ultrasound done on April 23 which revealed the endometrial stripe thickening to be 3.2 cm. There was a large hypoechoic mass 7 x 3 cm seen within the endometrium into the cervix. There were also multiple small cysts seen on the ovaries. An endometrial biopsy was performed that revealed a high-grade endometrial carcinoma with papillary architecture and serous features.  On Sep 30, 2014, the patient underwent a total abdominal hysterectomy, bilateral salpingo-oophorectomy, bilateral pelvic lymphadenectomy, partial omentectomy by Dr. Nancy Marus at [facility].  Operative findings included: Uterus slightly enlarged. Rectosigmoid colon was adherent to the posterior cul-de-sac, and bilateral adnexa were adherent to the cul-de-sac as well. Otherwise, the fallopian tubes and ovaries were normal in appearance. There was an enlarged ~2cm lymph node in the left common iliac space. This was removed and found to be positive for metastatic carcinoma on intraoperative frozen section. There was an additional enlarged left external iliac lymph node and an enlarged right external iliac lymph node. There were no enlarged lymph nodes in the bilateral obturator spaces, nor in the para-aortic region. There was an approximately 5cm  area of omentum that was firm to palpation, although no tumor was clearly visualized. The remainder of the abdominal and pelvic survey were normal, without evidence of disease. At the conclusion of the procedure, patient had no residual disease (R0 resection).  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, FIGO grade 3 (see synoptic)  FSB, B: Lymph node, left pelvic, regional resection  - One lymph node involved by metastatic carcinoma (1/1), with largest focus of metastasis 1.1 cm  C: Lymph node, left pelvic, regional resection  - One benign lymph node (0/1)  D: Lymph node, right pelvic, regional resection  - One lymph node involved by metastatic carcinoma (1/1), with largest focus of metastasis less than 0.1 cm  E: Omentum, omentectomy  - Diffusely involved by small foci of metastatic carcinoma  TUMOR Histologic Type: Carcinosarcoma (malignant mllerian mixed tumor) Histologic Grade: FIGO grade 3  EXTENT Tumor Size: Greatest dimension (cm): 6.6 Myometrial Invasion: Present Depth of Myometrial Invasion: Specify depth of invasion (mm): 22 mm (eg. A3,A9) Myometrial Thickness (mm): 23 Involvement of Cervix: Invasion of cervical stromal connective tissue Other Organs Submitted: Right ovary Involvement Status of Right Ovary: Involved Other Organs Submitted: Left ovary Involvement Status of Left Ovary: Not involved Other Organs Submitted: Right fallopian tube Involvement Status of Right Fallopian Tube: Involved Other Organs Submitted: Left fallopian tube Involvement Status of Left Fallopian Tube: Involved  MARGINS MARGINS: Uninvolved by invasive carcinoma Distance of Invasive Carcinoma from Closest Margin (  mm): Specify (mm): No tumor is present at the  cervical/vaginal cuff margin, however tumor extends to less than 1 mm from the deep soft tissue and cervical/vaginal cuff margin.  Specify Margin: Specify Margin: Cervical (anterior and posterior)  ACCESSORY FINDINGS Lymph-Vascular Invasion: Present  STAGE (pTNM [FIGO]) Primary Tumor (pT): pT3a [IIIA]: Tumor involves serosa and / or adnexa (direct extension or metastasis) Regional Lymph Nodes (pN): pN1 [IIIC1]: Regional lymph node metastasis to pelvic lymph nodes Pelvic Lymph Nodes Number of Lymph Nodes Examined: Specify: 3 Number of Lymph Nodes Involved: Specify: 2 Para-aortic Lymph Nodes Number of Lymph Nodes Involved: No para-aortic nodes submitted or found Distant Metastasis (pM): Not applicable  Comment(s) Comment(s): No heterologous elements identified. Extensive lymphovascular invasion by tumor is noted in the uterus, myometrium and cervix.  Recommendations at the Multidisciplinary Clinic at [facility]:  Disposition to adjuvant chemotherapy with Carbo/Taxol with plan to re-image at the end of chemotherapy treatment to determine if a course of pelvic radiation may offer additional benefit if no evidence of disease is present in imaging.  Interval History:  Patient presents today with her daughter for post-operative follow up and staple removal.  She states she has been doing well since surgery but is concerned staple removal will be painful.  Tolerating diet with no nausea or emesis.  Ambulating without assist.  Bowels and bladder functioning without difficulty.  Minimal pain reported.  Denies hematuria, vaginal bleeding, or rectal bleeding.  No issues or concerns voiced about her incision.  Daughter stating they "are not sure about chemo because of her kidneys."  They also voiced concerns about having a family member with ovarian cancer become "worn down" with chemotherapy.  No other concerns voiced.    Review of Systems  Constitutional: Feels well.  Denies early satiety, unintentional weight  loss or gain.  Cardiovascular: No chest pain, shortness of breath, or edema.  Pulmonary: No cough or wheeze.  Gastrointestinal: No nausea, vomiting, or diarrhea. No bright red blood per rectum or change in bowel movement.  Genitourinary: No frequency, urgency, or dysuria. No vaginal bleeding or discharge.  Musculoskeletal: No myalgia or joint pain. Neurologic: No weakness, numbness, or change in gait.  Psychology: No depression, anxiety, or insomnia.  Current Meds:  Outpatient Encounter Prescriptions as of 10/08/2014  Medication Sig  . amLODipine (NORVASC) 10 MG tablet Take 1 tablet (10 mg total) by mouth daily.  Marland Kitchen atorvastatin (LIPITOR) 80 MG tablet TAKE ONE TABLET BY MOUTH ONCE DAILY  . COMBIGAN 0.2-0.5 % ophthalmic solution   . hydrochlorothiazide (HYDRODIURIL) 25 MG tablet TAKE ONE TABLET BY MOUTH EVERY DAY  . insulin NPH-regular Human (NOVOLIN 70/30) (70-30) 100 UNIT/ML injection Inject 25 units in AM and 20 UNits in PM with meals.  . Iron-Vitamins (GERITOL COMPLETE PO) Take 1 capsule by mouth daily.    . Lancets MISC by Does not apply route.    . metoprolol (TOPROL-XL) 200 MG 24 hr tablet TAKE ONE TABLET BY MOUTH ONCE DAILY  . ONE TOUCH ULTRA TEST test strip USE ONE TEST STRIP TO CHECK BLOOD SUGAR EVERY DAY  . prednisoLONE acetate (PRED FORTE) 1 % ophthalmic suspension   . VIGAMOX 0.5 % ophthalmic solution    No facility-administered encounter medications on file as of 10/08/2014.    Allergy: No Known Allergies  Social Hx:   History   Social History  . Marital Status: Married    Spouse Name: N/A  . Number of Children: N/A  . Years of Education: N/A   Occupational  History  . Not on file.   Social History Main Topics  . Smoking status: Former Smoker -- 0.50 packs/day for 60 years    Types: Cigarettes    Quit date: 05/23/2010  . Smokeless tobacco: Never Used  . Alcohol Use: No  . Drug Use: No  . Sexual Activity: Not on file   Other Topics Concern  . Not on file    Social History Narrative   Exercise: Walking irregularly.   Diet: trying to improved   No living will, no HCPOA    Past Surgical Hx:  Past Surgical History  Procedure Laterality Date  . Appendectomy    . Spine surgery    . Tonsillectomy      Past Medical Hx:  Past Medical History  Diagnosis Date  . Diabetes mellitus   . Hyperlipidemia   . Hypertension   . Arthritis   . Seizures   . Enlarged heart   . Endometrial cancer     Family Hx:  Family History  Problem Relation Age of Onset  . Hypertension Mother   . Heart attack Father     Vitals:  Blood pressure 171/69, pulse 86, temperature 98 F (36.7 C), temperature source Oral, resp. rate 19, height 5\' 7"  (1.702 m), weight 189 lb 9.6 oz (86.002 kg), SpO2 100 %.  Physical Exam:  General: Well developed, well nourished female in no acute distress. Alert and oriented x 3.  Cardiovascular: Regular rate and rhythm. S1 and S2 normal.  Lungs: Clear to auscultation bilaterally. No wheezes/crackles/rhonchi noted.  Skin: No rashes or lesions present. Back: No CVA tenderness.  Abdomen: Abdomen soft, non-tender and obese. Active bowel sounds in all quadrants. No evidence of a fluid wave or abdominal masses.  Staples removed from the midline incision without difficulty.  1/2 inch steri strips applied.  No drainage or erythema noted.  Extremities: No bilateral cyanosis, edema, or clubbing.   Assessment/Plan:  81 year old s/p total abdominal hysterectomy, bilateral salpingo-oophorectomy, bilateral pelvic lymphadenectomy, partial omentectomy on May 10 at [facility] for high grade uterine carcinosarcoma.  Pathology and recommendations discussed with the patient and daughter by Dr. Alycia Rossetti.  Patient and daughter agreeable with meeting with Dr. Evlyn Clines to discuss chemotherapy.  Incision care reinforced and reportable signs/symptoms reviewed.  She is to follow up as scheduled with Dr. Denman George for her post-operative check or sooner if needed.   She is advised to call for any questions or concerns.  She will be contacted with her appointment date and time for Dr. Marko Plume.   CROSS, MELISSA DEAL, NP 10/10/2014, 10:02 AM

## 2014-10-13 ENCOUNTER — Other Ambulatory Visit: Payer: Self-pay | Admitting: Oncology

## 2014-10-15 ENCOUNTER — Other Ambulatory Visit: Payer: Medicare Other

## 2014-10-15 ENCOUNTER — Ambulatory Visit: Payer: Medicare Other

## 2014-10-17 ENCOUNTER — Other Ambulatory Visit (HOSPITAL_BASED_OUTPATIENT_CLINIC_OR_DEPARTMENT_OTHER): Payer: Medicare Other

## 2014-10-17 ENCOUNTER — Encounter: Payer: Self-pay | Admitting: Oncology

## 2014-10-17 ENCOUNTER — Ambulatory Visit (HOSPITAL_BASED_OUTPATIENT_CLINIC_OR_DEPARTMENT_OTHER): Payer: Medicare Other | Admitting: Oncology

## 2014-10-17 VITALS — BP 159/65 | HR 65 | Temp 98.1°F | Resp 1 | Ht 67.0 in | Wt 187.7 lb

## 2014-10-17 DIAGNOSIS — N3289 Other specified disorders of bladder: Secondary | ICD-10-CM | POA: Diagnosis not present

## 2014-10-17 DIAGNOSIS — R300 Dysuria: Secondary | ICD-10-CM

## 2014-10-17 DIAGNOSIS — I10 Essential (primary) hypertension: Secondary | ICD-10-CM

## 2014-10-17 DIAGNOSIS — C55 Malignant neoplasm of uterus, part unspecified: Secondary | ICD-10-CM | POA: Diagnosis not present

## 2014-10-17 DIAGNOSIS — N185 Chronic kidney disease, stage 5: Secondary | ICD-10-CM

## 2014-10-17 DIAGNOSIS — N189 Chronic kidney disease, unspecified: Secondary | ICD-10-CM

## 2014-10-17 DIAGNOSIS — C541 Malignant neoplasm of endometrium: Secondary | ICD-10-CM

## 2014-10-17 DIAGNOSIS — E119 Type 2 diabetes mellitus without complications: Secondary | ICD-10-CM | POA: Diagnosis not present

## 2014-10-17 DIAGNOSIS — E1122 Type 2 diabetes mellitus with diabetic chronic kidney disease: Secondary | ICD-10-CM

## 2014-10-17 DIAGNOSIS — M1990 Unspecified osteoarthritis, unspecified site: Secondary | ICD-10-CM

## 2014-10-17 LAB — CBC WITH DIFFERENTIAL/PLATELET
BASO%: 1.1 % (ref 0.0–2.0)
BASOS ABS: 0.1 10*3/uL (ref 0.0–0.1)
EOS%: 1.8 % (ref 0.0–7.0)
Eosinophils Absolute: 0.2 10*3/uL (ref 0.0–0.5)
HEMATOCRIT: 34 % — AB (ref 34.8–46.6)
HGB: 10.7 g/dL — ABNORMAL LOW (ref 11.6–15.9)
LYMPH#: 1.2 10*3/uL (ref 0.9–3.3)
LYMPH%: 10.9 % — ABNORMAL LOW (ref 14.0–49.7)
MCH: 23.3 pg — ABNORMAL LOW (ref 25.1–34.0)
MCHC: 31.4 g/dL — AB (ref 31.5–36.0)
MCV: 74.3 fL — ABNORMAL LOW (ref 79.5–101.0)
MONO#: 0.7 10*3/uL (ref 0.1–0.9)
MONO%: 6.5 % (ref 0.0–14.0)
NEUT#: 8.9 10*3/uL — ABNORMAL HIGH (ref 1.5–6.5)
NEUT%: 79.7 % — ABNORMAL HIGH (ref 38.4–76.8)
PLATELETS: 504 10*3/uL — AB (ref 145–400)
RBC: 4.58 10*6/uL (ref 3.70–5.45)
RDW: 17.4 % — AB (ref 11.2–14.5)
WBC: 11.2 10*3/uL — AB (ref 3.9–10.3)

## 2014-10-17 LAB — COMPREHENSIVE METABOLIC PANEL (CC13)
ALK PHOS: 88 U/L (ref 40–150)
ALT: 24 U/L (ref 0–55)
ANION GAP: 17 meq/L — AB (ref 3–11)
AST: 12 U/L (ref 5–34)
Albumin: 3.3 g/dL — ABNORMAL LOW (ref 3.5–5.0)
BUN: 65.8 mg/dL — ABNORMAL HIGH (ref 7.0–26.0)
CO2: 22 meq/L (ref 22–29)
Calcium: 9.1 mg/dL (ref 8.4–10.4)
Chloride: 105 mEq/L (ref 98–109)
Creatinine: 4.2 mg/dL (ref 0.6–1.1)
EGFR: 11 mL/min/{1.73_m2} — ABNORMAL LOW (ref >90)
Glucose: 158 mg/dl — ABNORMAL HIGH (ref 70–140)
POTASSIUM: 4.2 meq/L (ref 3.5–5.1)
Sodium: 144 mEq/L (ref 136–145)
Total Bilirubin: 0.47 mg/dL (ref 0.20–1.20)
Total Protein: 7.3 g/dL (ref 6.4–8.3)

## 2014-10-17 LAB — URINALYSIS, MICROSCOPIC - CHCC
BILIRUBIN (URINE): NEGATIVE
GLUCOSE UR CHCC: NEGATIVE mg/dL
Ketones: NEGATIVE mg/dL
Nitrite: NEGATIVE
PH: 6 (ref 4.6–8.0)
Protein: 30 mg/dL
Specific Gravity, Urine: 1.015 (ref 1.003–1.035)
Urobilinogen, UR: 0.2 mg/dL (ref 0.2–1)

## 2014-10-19 ENCOUNTER — Other Ambulatory Visit: Payer: Self-pay | Admitting: Oncology

## 2014-10-19 DIAGNOSIS — N184 Chronic kidney disease, stage 4 (severe): Principal | ICD-10-CM

## 2014-10-19 DIAGNOSIS — E1122 Type 2 diabetes mellitus with diabetic chronic kidney disease: Secondary | ICD-10-CM

## 2014-10-19 DIAGNOSIS — C55 Malignant neoplasm of uterus, part unspecified: Secondary | ICD-10-CM

## 2014-10-19 LAB — URINE CULTURE

## 2014-10-19 NOTE — Progress Notes (Signed)
Doris [name] NEW PATIENT EVALUATION   Name: Doris [name] Date: Oct 19, 2014  MRN: [id number] DOB: 1933/04/26  REFERRING PHYSICIAN: Nancy Marus cc Jinny Sanders, MD, J.McComb, Kathlyn Sacramento, (?Erling Cruz)  Information is from this EMR, outside records from [facility] reviewed in detail,  and history from patient and family member now.  REASON FOR REFERRAL: IIIC carcinosarcoma of uterus, for consideration of adjuvant chemotherapy.   HISTORY OF PRESENT ILLNESS:Doris [name] is a 81 y.o. female who is seen in consultation, together with granddaughter, at the request of  Dr Nancy Marus, for consideration of adjuvant chemotherapy for recently diagnosed IVB carcinosarcoma of uterus.  Patient has been followed closely for several years by PCP Dr Eliezer Lofts. She presented with ~ 6 weeks of vaginal spotting, with [place] on 08-22-14 showing endometrium 28 mm thick, with uterus 10.5 x 5.5 x 6.3 cm with 2 fibroids, and thick walled cystic lesion in left ovary measuring 13 mm.She had a repeat ultrasound done on April 23 which revealed the endometrial stripe thickening to be 56mm. There was a  hypoechoic mass 7 x 3 cm seen within the endometrium into the cervix. 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  She was seen by Dr Alycia Rossetti, with CT AP in [facility] system 09-23-14 showing small retroperitoneal/ pelvic lymph nodes, enlarged uterus with heterogeneous thickening of endometrium, bilateral ovaries not remarkable, no ascites, also gallstone, degenerative arthritis and probably chronic compression fracture L1. Creatinine on 09-09-14 was 2.88, with recent priors ~ 2.5. She had cardiology clearance prior to surgery. Surgery was done at [facility] 09-30-14, which was TAH/ BSO, pelvic lymph node evaluation and partial omentectomy for R0 resection by Dr Alycia Rossetti. Pathology from [facility] [phone]) found carcinosarcoma of uterus, with  homologous sarcomatous component and mixed serous/ clear cell FIGO grade 3 carcinoma. Primary tumor was 6.6 cm with 22 / 23 mm invasion, right tube, right ovary and left tube involved, omentum diffusely involved, + LVSI, 1 left pelvic node and 1 right pelvic node involved (left ovary not involved). Post operative course was not remarkable and she was discharged home on POD #3 (10-03-14). Labs from 10-03-14 had WBC 15, Hgb 10.4, plt 328, BUN 47 and creatinine 2.39. Case was presented at gyn oncology multidisciplinary conference at [facility] on 10-08-14, tho information in that summary did not include her renal function, with recommendation for adjuvant carboplatin and taxol, with plan to reimage after completion of chemotheray for possible pelvic radiation. She had staples removed at gyn onc clinic on 10-10-14 and is to see Dr Denman George on 10-31-14. Patient attended chemotherapy education class prior to this visit. SHe has not been taking heparin as directed, generally only one time daily.  REVIEW OF SYSTEMS : Usual weight ~ 193 lbs. No pain since surgery and has not used pain medication. Appetite and po intake including fluids poor since surgery. No nausea, throat initially sore but improved, early satiety, no GERD. Bowels moving with stool softener bid. Some bladder pressure. No fever. No SOB or cough. No HA. Wears glasses. No allergy symptoms, hearing ok. Full dentures. No history thyroid disease. Mammograms done remotely at [place] left knee, most bothersome when sits down or stands up. Slight swelling in feet. Is voiding as usual. No bleeding. Diet review today by time of late afternoon visit: 2 bites sausage biscuit, oatmeal, 1 glass water.  Remainder of full 10 point review of systems negative.   ALLERGIES: Review  of patient's allergies indicates no known allergies.  PAST MEDICAL/ SURGICAL HISTORY:    [id number] Chronic back problems, steroid injections not helpful Diabetes x 25 years, checks  blood sugars at home, fasting has been 90-130 HTN CKD Appendectomy/ tonsils Surgery for spurs cervical spine  CURRENT MEDICATIONS: reviewed as listed now in EMR. Discussed heparin tid dosing.  PHARMACY [facility] [place]  SOCIAL HISTORY:  Staying with d aughter. 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 1 son, 1 daughter, 2 granddaughters, 1 grandson.  FAMILY HISTORY:  Daughter died with "ovarian ca" in 20s (tho by description ?cervical) Maternal first cousin and 2 maternal aunts breast cancer          PHYSICAL EXAM:  height is 5\' 7"  (1.702 m) and weight is 187 lb 11.2 oz (85.14 kg). Her oral temperature is 98.1 F (36.7 C). Her blood pressure is 159/65 and her pulse is 65. Her respiration is 1.  Appears stated age Alert, pleasant, cooperative lady,good historian, granddaughter very supportive. Changes positions slowly with left knee discomfort, otherwise looks comfortable.  HEENT: PERRL, not icteric. Oral mucosa moist and clear. Neck supple without JVD or thyroid mass  RESPIRATORY: lungs without wheezes or rales, no dullness to percussion. Respirations not labored RA  CARDIAC/ VASCULAR: heart RRR without gallop. Peripheral pulses intact and symmetrical  ABDOMEN: midline incision closed, not tender, unremarkable. Soft, normally active BS, not tender including epigastrium.  LYMPH NODES: no cervical, supraclavicular, axillary or inguinal adenopathy  BREASTS:bilaterally without dominant mass, skin or nipple findings of concern  NEUROLOGIC: CN, motor, sensory, cerebellar grossly nonfocal. PSYCH appropriate mood and affect  SKIN:without ecchymosis, rash  MUSCULOSKELETAL:LE without edema, cords, tenderness    LABORATORY DATA: 10-17-14 WBC 11.2, ANC 8.9, Hgb 10.7, MCV 74.3, plt 504  CMET: Na 144, K 4.2, Cl 105, CO2 22, glu 158, CUN 65.8, creat 4.2, Tbili 0.47, AP 88, AST 12, ALT 24, Tprot 7.3, alb 3.3, ca 9.1  UA  moderate blood, small LE, 7-10 RBC, 7-10 WBC, few bacteria and many epithelial Urine culture pending   PATHOLOGY: [facility] [id number] from 09-30-14  A: Uterus, cervix, bilateral tubes and ovaries, total hysterectomy and bilateral salpingo-oophorectomy  - Carcinosarcoma, homologous type, consisting of homologous sarcomatous component and mixed serous and clear cell carcinoma components, FIGO grade 3 (see synoptic)  FSB, B: Lymph node, left pelvic, regional resection  - One lymph node involved by metastatic carcinoma (1/1), with largest focus of metastasis 1.1 cm  C: Lymph node, left pelvic, regional resection  - One benign lymph node (0/1)  D: Lymph node, right pelvic, regional resection  - One lymph node involved by metastatic carcinoma (1/1), with largest focus of metastasis less than 0.1 cm  E: Omentum, omentectomy  - Diffusely involved by small foci of metastatic carcinoma  TUMOR Histologic Type: Carcinosarcoma (malignant mllerian mixed tumor) Histologic Grade: FIGO grade 3  EXTENT Tumor Size: Greatest dimension (cm): 6.6 Myometrial Invasion: Present Depth of Myometrial Invasion: Specify depth of invasion (mm): 22 mm (eg. A3,A9) Myometrial Thickness (mm): 23 Involvement of Cervix: Invasion of cervical stromal connective tissue Other Organs Submitted: Right ovary Involvement Status of Right Ovary: Involved Other Organs Submitted: Left ovary Involvement Status of Left Ovary: Not involved Other Organs Submitted: Right fallopian tube Involvement Status of Right Fallopian Tube: Involved Other Organs Submitted: Left fallopian tube Involvement Status of Left Fallopian Tube: Involved  MARGINS MARGINS: Uninvolved by invasive carcinoma Distance of Invasive Carcinoma from Closest Margin (mm):  Specify (mm): No tumor is present at the cervical/vaginal cuff margin, however tumor extends to less than 1 mm from the deep soft tissue and cervical/vaginal cuff margin.  Specify Margin: Specify  Margin: Cervical (anterior and posterior)  ACCESSORY FINDINGS Lymph-Vascular Invasion: Present  STAGE (pTNM [FIGO]) Primary Tumor (pT): pT3a [IIIA]: Tumor involves serosa and / or adnexa (direct extension or metastasis) Regional Lymph Nodes (pN): pN1 [IIIC1]: Regional lymph node metastasis to pelvic lymph nodes Pelvic Lymph Nodes Number of Lymph Nodes Examined: Specify: 3 Number of Lymph Nodes Involved: Specify: 2 Para-aortic Lymph Nodes Number of Lymph Nodes Involved: No para-aortic nodes submitted or found Distant Metastasis (pM): Not applicable  Comment(s) Comment(s): No heterologous elements identified. Extensive lymphovascular invasion by tumor is noted in the uterus, myometrium and cervix.  RADIOGRAPHY:  TRANSABDOMINAL AND TRANSVAGINAL ULTRASOUND OF PELVIS   08-22-14  TECHNIQUE: Both transabdominal and transvaginal ultrasound examinations of the pelvis were performed. Transabdominal technique was performed for global imaging of the pelvis including uterus, ovaries, adnexal regions, and pelvic cul-de-sac. It was necessary to proceed with endovaginal exam following the transabdominal exam to visualize the ovaries and endometrium.  COMPARISON: None  FINDINGS: Uterus  Measurements: 10.5 x 5.5 x 6.3 cm. Two uterine fibroids are noted within the substance of the uterus the largest of these measures 19 mm.  Endometrium  Thickness: Thickened at 28 mm. Increased vascularity is noted. Given the patient's clinical history further evaluation is recommended.  Right ovary  Measurements: 3.4 x 2.1 x 2.5 cm. Normal appearance/no adnexal mass.  Left ovary  Measurements: 3.3 x 1.5 x 2.4 cm. A thick walled cystic lesion is noted within the left ovary measuring 13 mm. This is of uncertain etiology but the possibility of a cystic neoplasm would deserve consideration.  Other findings  No free fluid.  IMPRESSION: Prominent endometrium at 2.8 cm with increased  vascularity. The possibility of endometrial neoplasm deserves consideration. Further evaluation is recommended.  Thick walled cystic lesion within the left ovary measuring 13 mm. This is of uncertain etiology but a cystic neoplasm would deserve consideration      CT ABDOMEN AND PELVIS WITHOUT CONTRAST  09-23-14  COMPARISON: Pelvic ultrasound dated 08/22/2014  FINDINGS: Lower chest: Mild scarring at the left lung base.  Hepatobiliary: Unenhanced liver is unremarkable.  1.7 cm gallstone (series 2/ image 22). No intrahepatic or extrahepatic ductal dilatation.  Pancreas: Parenchymal versus vascular calcification posterior to the uncinate process (series 2/ image 30). Otherwise within normal limits.  Spleen: Within normal limits.  Adrenals/Urinary Tract: Adrenal glands are within normal limits.  Numerous bilateral renal cysts of varying sizes and complexities, measuring up to 7.1 cm on the left and 6.2 cm on the right, many of which are hemorrhagic with a few scattered rim calcifications. No hydronephrosis.  Bladder is within normal limits.  Stomach/Bowel: Stomach is within normal limits.  No evidence of bowel obstruction.  Status post appendectomy.  Vascular/Lymphatic: Atherosclerotic calcifications of the abdominal aorta and branch vessels.  Small retroperitoneal/pelvic lymph nodes, including:  --10 mm short axis retrocaval node (series 2/image 33)  --9 mm short axis left para-aortic node (series 2/image 34)  --10 mm short axis left external iliac node (series 2/ image 64)  Reproductive: Enlargement of the uterus with heterogeneous thickening of the endometrium, measuring approximately 3.3 cm (sagittal image 71), poorly evaluated on unenhanced CT.  Bilateral ovaries are within normal limits.  Other: No abdominopelvic ascites.  Musculoskeletal: Degenerative changes of the visualized thoracolumbar spine.  Moderate inferior endplate  compression fracture deformity at L1, with mild retropulsion, age indeterminate although likely chronic.  IMPRESSION: Heterogeneous thickening of the endometrium, poorly evaluated on unenhanced CT, corresponding to known endometrial carcinoma.  Associated borderline enlarged left pelvic/retroperitoneal lymph nodes, indeterminate.  Otherwise, no findings specific for metastatic disease.  Numerous bilateral renal cysts of varying sizes and complexities, compatible with autosomal dominant polycystic kidney disease.  Cholelithiasis    DISCUSSION: all of information above reviewed with patient and family member now. We have discussed fact that this cancer was extensive in abdomen and pelvis at surgery, and is a particularly aggressive type of cancer. We have discussed recommendation from multidisciplinary conference for chemotherapy, however renal function even at baseline is not adequate for carboplatin, and is much higher by labs today. I have told patient that most often this type of cancer would reoccur despite adjuvant therapy. Note patient and granddaughter recall the very difficult time that her daughter/ granddaughter's mother had with chemotherapy, then died 70 months after diagnosis. Patient and granddaughter both tell me that they are relieved that I do not feel this chemotherapy can be given to her safely. I have told them that I will discuss with Dr Alycia Rossetti, and I will see patient back to be sure no changes to this recommendation.  As for the marked further elevation in renal function, this may be related to very poor po intake since returning home after surgery. She agrees to push po fluids thru weekend. I have let Dr Josefa Half know, and she will see patient with repeat labs early next week.      IMPRESSION / PLAN:  1.IVB carcinosarcoma of uterus: post R0 debulking 09-30-14, with path finding of more extensive disease than was visually apparent. Recommendation was adjuvant  chemotherapy with carboplatin and taxol, however renal function is markedly elevated such that carboplatin (or ifosfamide) cannot be used. Patient and granddaughter understand that there is high likelihood of disease progression with or without chemotherapy. I will discuss with Dr Alycia Rossetti and will follow up with patient also. She has not had radiation therapy consultation. 2.Chronic kidney disease: nonoliguric however creatinine much higher today than usual elevated baseline. Appreciate Dr Diona Browner helping with this; patient tells me that she had seen nephrologist in past, possibly Dr Florene Glen (?). Plan for next 2-3 days as above, hold diuretic 3.diabetes x 25 years on insulin 4.long past tobacco 5.degenerative arthritis 6.bladder symptoms: not clearly UTI but will follow up culture and let her know  7.HTN, but no indication for ETT prior to surgery per cardiology   Patient and granddaughter have had questions answered to their satisfaction and are in agreement with plan above. They can contact this office for questions or concerns at any time prior to next scheduled visit.  Time spent  45 min , including >50% discussion and coordination of care.    Gordy Levan, MD

## 2014-10-21 ENCOUNTER — Telehealth: Payer: Self-pay

## 2014-10-21 ENCOUNTER — Telehealth: Payer: Self-pay | Admitting: Oncology

## 2014-10-21 NOTE — Telephone Encounter (Signed)
Told Ms. Carlota Raspberry that the urine culture  Was negative for infection as noted below by Dr. Marko Plume. Ms. Vogelgesang has an appointment with Dr. Valorie Roosevelt on 10-24-14 and a lab appointment on 10-23-14. Told her appointment date and time for Dr. Serita Grit appointment on 10-31-14.  Requested that Ms. Roehrich call Dr. Serita Grit office if she is going to be out of town on 6-1-0-16 to r/s.  Patient states that she is goivng ~11-01-14 but did not know the definite  Date. Pt. Received the return appointment with Dr. Marko Plume this am from [facility].  It. Is 11-13-14 at 0830 for lab and 0900 for visit.

## 2014-10-21 NOTE — Telephone Encounter (Signed)
-----   Message from Gordy Levan, MD sent at 10/19/2014 11:40 PM EDT ----- Please let her know that urine culture did not find infection.  Tell her that I let Dr Diona Browner know about the elevated kidney function, and she will see Doris [name] this week - If patient has not heard from Dr Diona Browner when you speak with her, please have her call Dr Rometta Emery office then.  Tell her that I have sent the information also to Dr Alycia Rossetti. She should keep appointment with Dr Elenora Gamma partner, Dr Denman George on 6-10 as scheduled. Our schedulers will let her know about another appointment to me ~ 6-20.   thanks

## 2014-10-21 NOTE — Telephone Encounter (Signed)
Called patient and she is aware of her follow up appointment

## 2014-10-22 ENCOUNTER — Other Ambulatory Visit: Payer: Self-pay | Admitting: Family Medicine

## 2014-10-22 DIAGNOSIS — N184 Chronic kidney disease, stage 4 (severe): Secondary | ICD-10-CM

## 2014-10-23 ENCOUNTER — Telehealth: Payer: Self-pay | Admitting: Radiology

## 2014-10-23 ENCOUNTER — Other Ambulatory Visit (INDEPENDENT_AMBULATORY_CARE_PROVIDER_SITE_OTHER): Payer: Medicare Other

## 2014-10-23 DIAGNOSIS — N184 Chronic kidney disease, stage 4 (severe): Secondary | ICD-10-CM

## 2014-10-23 LAB — BASIC METABOLIC PANEL
BUN: 60 mg/dL — AB (ref 6–23)
CALCIUM: 9.1 mg/dL (ref 8.4–10.5)
CO2: 25 meq/L (ref 19–32)
Chloride: 105 mEq/L (ref 96–112)
Creatinine, Ser: 4.4 mg/dL — ABNORMAL HIGH (ref 0.40–1.20)
GFR: 12.41 mL/min — AB (ref >60.00)
Glucose, Bld: 121 mg/dL — ABNORMAL HIGH (ref 70–99)
POTASSIUM: 4 meq/L (ref 3.5–5.1)
Sodium: 140 mEq/L (ref 135–145)

## 2014-10-23 NOTE — Telephone Encounter (Signed)
Stable from previous. Pt has appt tomorrow. Will need ASAP referal to nephrology.

## 2014-10-23 NOTE — Telephone Encounter (Signed)
[facility] called critical results, GFR-12.1, Crt-4.4. Results given to Dr Diona Browner

## 2014-10-24 ENCOUNTER — Ambulatory Visit (INDEPENDENT_AMBULATORY_CARE_PROVIDER_SITE_OTHER): Payer: Medicare Other | Admitting: Family Medicine

## 2014-10-24 ENCOUNTER — Encounter: Payer: Self-pay | Admitting: Family Medicine

## 2014-10-24 VITALS — BP 150/64 | HR 88 | Temp 98.4°F | Ht 67.0 in | Wt 185.5 lb

## 2014-10-24 DIAGNOSIS — N179 Acute kidney failure, unspecified: Secondary | ICD-10-CM | POA: Diagnosis not present

## 2014-10-24 DIAGNOSIS — I10 Essential (primary) hypertension: Secondary | ICD-10-CM | POA: Diagnosis not present

## 2014-10-24 DIAGNOSIS — C541 Malignant neoplasm of endometrium: Secondary | ICD-10-CM

## 2014-10-24 DIAGNOSIS — N184 Chronic kidney disease, stage 4 (severe): Secondary | ICD-10-CM

## 2014-10-24 DIAGNOSIS — E1122 Type 2 diabetes mellitus with diabetic chronic kidney disease: Secondary | ICD-10-CM

## 2014-10-24 DIAGNOSIS — N189 Chronic kidney disease, unspecified: Secondary | ICD-10-CM

## 2014-10-24 NOTE — Progress Notes (Signed)
Pre visit review using our clinic review tool, if applicable. No additional management support is needed unless otherwise documented below in the visit note. 

## 2014-10-24 NOTE — Assessment & Plan Note (Signed)
Well controlled  On current insulin regimen with recent weight loss and decrease po intake.

## 2014-10-24 NOTE — Assessment & Plan Note (Signed)
?   Secondary to decrease BP at recent surgery but pt not aware if this occurred.  No new meds other than heparin.  Holding HCTZ and pushing fluids.. Pt states she did not increase her fluids intake as much as she could.  Recommend referral to nephrology. Of note pt is hesitant about seeing kidney doctor, she is not sure she would have dialysis done if this was needed. Discussed symptoms of  critical stage of renal failure.  In meantime pt will push fluids and return for repeat BMET in 1 week.

## 2014-10-24 NOTE — Assessment & Plan Note (Signed)
Blood pressure inadequate control off HCTZ. Will follow and if remaining high will consider additional medication.

## 2014-10-24 NOTE — Patient Instructions (Addendum)
Push fluids over the next week. Return for BMET on 6/13 with follow up  6/14 . Stop at front desk for referral to kidney doctor.

## 2014-10-24 NOTE — Progress Notes (Signed)
Subjective:    Patient ID: Doris [name], female    DOB: 28-Jun-1932, 81 y.o.   MRN: [id number]  HPI  81 year old female wth DM and CKD with recentl diagnosis and  total hystectomy, BSO, bilateral lymphadenectomy, partial omentectomy by Dr. Syble Creek at [facility] for endometrial carcinoma. ( Stage IIIC1)  She has history of CKD... Cr chronically stable at 2.5.  She has seen Dr. Florene Glen years ago and was diagnosed with CKD likely due to DM.  Following her recent surgery on 5/10 she has had elevated Cr to 4.2 on 10/17/14 at appt with Dr. Marko Plume for consideration of adjuvant chemotherapy Over the last week she has increased fluids (5 glasses a day) and stopped HCTZ given felt acute decline of kidney function may have been due to prerenal azotemia due to dehydration. She had not been eating or drinking much given feeling of fullness. No ibuprofen.  She is on heparin but forgetting to take.  No complications with the surgery.   Her repeat BMET on 10/23/2014 showed Cr 4.4 and GFR at 12.41.  She states she feels pretty good. No pain, not needing pain meds post op.  No swelling in ankles.  She has has been very itchy  in armpits and on back.. She feels this is due to changing brands of insulin.  Blood sugar has been fasting 130 surgeries. Wt Readings from Last 3 Encounters:  10/24/14 185 lb 8 oz (84.142 kg)  10/17/14 187 lb 11.2 oz (85.14 kg)  10/08/14 189 lb 9.6 oz (86.002 kg)   BP Readings from Last 3 Encounters:  10/24/14 150/64  10/17/14 159/65  10/08/14 171/69       Review of Systems  Constitutional: Negative for fever and fatigue.  HENT: Negative for ear pain.   Eyes: Negative for pain.  Respiratory: Negative for chest tightness and shortness of breath.   Cardiovascular: Negative for chest pain, palpitations and leg swelling.  Gastrointestinal: Negative for abdominal pain.  Genitourinary: Negative for dysuria.       Objective:   Physical Exam  Constitutional: Vital signs are  normal. She appears well-developed and well-nourished. She is cooperative.  Non-toxic appearance. She does not appear ill. No distress.  Elderly female in NAD  HENT:  Head: Normocephalic.  Right Ear: Hearing, tympanic membrane, external ear and ear canal normal. Tympanic membrane is not erythematous, not retracted and not bulging.  Left Ear: Hearing, tympanic membrane, external ear and ear canal normal. Tympanic membrane is not erythematous, not retracted and not bulging.  Nose: No mucosal edema or rhinorrhea. Right sinus exhibits no maxillary sinus tenderness and no frontal sinus tenderness. Left sinus exhibits no maxillary sinus tenderness and no frontal sinus tenderness.  Mouth/Throat: Uvula is midline, oropharynx is clear and moist and mucous membranes are normal.  Eyes: Conjunctivae, EOM and lids are normal. Pupils are equal, round, and reactive to light. Lids are everted and swept, no foreign bodies found.  Neck: Trachea normal and normal range of motion. Neck supple. Carotid bruit is not present. No thyroid mass and no thyromegaly present.  Cardiovascular: Normal rate, regular rhythm, S1 normal, S2 normal, normal heart sounds, intact distal pulses and normal pulses.  Exam reveals no gallop and no friction rub.   No murmur heard. Pulmonary/Chest: Effort normal and breath sounds normal. No tachypnea. No respiratory distress. She has no decreased breath sounds. She has no wheezes. She has no rhonchi. She has no rales.  Abdominal: Soft. Normal appearance and bowel sounds are normal. There  is no hepatosplenomegaly. There is no tenderness. There is no CVA tenderness.  Healing surgical sites  Neurological: She is alert.  Skin: Skin is warm, dry and intact. No rash noted.  Psychiatric: Her speech is normal and behavior is normal. Judgment and thought content normal. Her mood appears not anxious. Cognition and memory are normal. She does not exhibit a depressed mood.          Assessment & Plan:

## 2014-10-24 NOTE — Assessment & Plan Note (Signed)
Pt states today that she is unsure she wants to do adjuvant chemo. She asks questions today about what would be the risk of not doing chemo. I answered her to the best of my ability.  She states her family does not want her to. They are not available today for discussion. She does not wish med to contact them.

## 2014-10-27 ENCOUNTER — Other Ambulatory Visit: Payer: Self-pay | Admitting: Family Medicine

## 2014-10-27 DIAGNOSIS — N184 Chronic kidney disease, stage 4 (severe): Secondary | ICD-10-CM

## 2014-10-29 ENCOUNTER — Encounter: Payer: Self-pay | Admitting: Skilled Nursing Facility1

## 2014-10-29 NOTE — Progress Notes (Signed)
Subjective:     Patient ID: Doris [name], female   DOB: 11-09-1932, 81 y.o.   MRN: [id number]  HPI   Review of Systems     Objective:   Physical Exam To assist the pt in identifying dietary strategies to gain some lost wt back.     Assessment:     Pt identified as being malnourished due to some lost wt. Pt contacted via the telephone at [phone]. Pt seemed in good spirits and states her appetite is great. Pt states she did lose about 6 pounds but she feels she has gained that wt back. Pt states she eats throughout the day.    Plan:     Dietitian encouraged the pt to call if her appetite declines or she loses wt.

## 2014-10-31 ENCOUNTER — Ambulatory Visit: Payer: Medicare Other | Attending: Gynecologic Oncology | Admitting: Gynecologic Oncology

## 2014-10-31 ENCOUNTER — Encounter: Payer: Self-pay | Admitting: Gynecologic Oncology

## 2014-10-31 VITALS — BP 150/68 | HR 78 | Temp 97.6°F | Resp 18 | Ht 67.0 in | Wt 183.1 lb

## 2014-10-31 DIAGNOSIS — C7989 Secondary malignant neoplasm of other specified sites: Secondary | ICD-10-CM | POA: Diagnosis not present

## 2014-10-31 DIAGNOSIS — Z90722 Acquired absence of ovaries, bilateral: Secondary | ICD-10-CM | POA: Insufficient documentation

## 2014-10-31 DIAGNOSIS — C55 Malignant neoplasm of uterus, part unspecified: Secondary | ICD-10-CM | POA: Diagnosis not present

## 2014-10-31 DIAGNOSIS — N189 Chronic kidney disease, unspecified: Secondary | ICD-10-CM | POA: Diagnosis not present

## 2014-10-31 DIAGNOSIS — C775 Secondary and unspecified malignant neoplasm of intrapelvic lymph nodes: Secondary | ICD-10-CM | POA: Diagnosis not present

## 2014-10-31 DIAGNOSIS — Z90710 Acquired absence of both cervix and uterus: Secondary | ICD-10-CM

## 2014-10-31 DIAGNOSIS — Z483 Aftercare following surgery for neoplasm: Secondary | ICD-10-CM | POA: Insufficient documentation

## 2014-10-31 NOTE — Progress Notes (Signed)
Postoperative Follow-up  HPI:  Doris [name] is a 81 y.o. year old  initially seen in consultation on 09/18/14 for uterine carcinosarcoma.  She then underwent an exploratory laparotomy, BSO and omentectomy on 09/30/14 with Dr Nancy Marus at [facility] in [place] without complications.  Her postoperative course was uncomplicated with the exception of postoperative elevation in creatinine.  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  She is seen today for a postoperative check and to discuss her pathology results and treatment plan.  Since discharge from the hospital, she is feeling well.  She has improving appetite, normal bowel and bladder function, and pain controlled with minimal PO medication. She has no other complaints today.  She say Dr Marko Plume last week and was told that chemotherapy may not be an option secondary to her renal dysfunction.   Review of systems: Constitutional:  She has no weight gain or weight loss. She has no fever or chills. Eyes: No blurred vision Ears, Nose, Mouth, Throat: No dizziness, headaches or changes in hearing. No mouth sores. Cardiovascular: No chest pain, palpitations or edema. Respiratory:  No shortness of breath, wheezing or cough Gastrointestinal: She has normal bowel movements without diarrhea or constipation. She denies any nausea or vomiting. She denies blood in her stool or heart burn. Genitourinary:  She denies pelvic pain, pelvic pressure or changes in her urinary function. She has no hematuria, dysuria, or incontinence. She has no irregular vaginal bleeding or vaginal discharge Musculoskeletal: Denies muscle weakness or joint pains.  Skin:  She has no skin changes, rashes or itching Neurological:  Denies dizziness or headaches. No  neuropathy, no numbness or tingling. Psychiatric:  She denies depression or anxiety. Hematologic/Lymphatic:   No easy bruising or bleeding   Physical Exam: Blood pressure 150/68, pulse 78, temperature 97.6 F (36.4 C), temperature source Oral, resp. rate 18, height 5\' 7"  (1.702 m), weight 183 lb 1.6 oz (83.054 kg). General: Well dressed, well nourished in no apparent distress.   HEENT:  Normocephalic and atraumatic, no lesions.  Extraocular muscles intact. Sclerae anicteric. Pupils equal, round, reactive. No mouth sores or ulcers. Thyroid is normal size, not nodular, midline. Skin:  No lesions or rashes. Breasts: deferred Lungs:  Clear to auscultation bilaterally.  No wheezes. Cardiovascular:  Regular rate and rhythm.  No murmurs or rubs. Abdomen:  Soft, nontender, nondistended.  No palpable masses.  No hepatosplenomegaly.  No ascites. Normal bowel sounds.  No hernias.  Incision is well healed Genitourinary: Normal EGBUS  Vaginal cuff intact.  No bleeding or discharge.  No cul de sac fullness. Extremities: No cyanosis, clubbing or edema.  No calf tenderness or erythema. No palpable cords.  Psychiatric: Mood and affect are appropriate. Neurological: Awake, alert and oriented x 3. Sensation is intact, no neuropathy.  Musculoskeletal: No pain, normal strength and range of motion.  Assessment:    81 y.o. 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 + LVSI, 99% myometrial invasion, positive omentum and positive pelvic lymph nodes. Chronic kidney disease with acute exacerbation.   Plan: 1) Pathology reports reviewed today 2) Treatment counseling - I discussed with the patient and her daughter that she has very advanced and widely metastatic cancer. I discussed that the only curative option  for therapy would involve chemotherapy. This may not be possible secondary to renal dysfunction. I  will reach out to Dr Marko Plume to evaluate for other treatment options. Despite her advanced age and CKD, she has good performance status to consider a more palliative intent chemotherapy course. We will also have her see radiation oncology. Radiation would not be curative in this patient with upper abdominal and lymphatic metastases, but might help with local control in the pelvis and prevent development of a highly symptomatic vaginal or pelvic recurrence.  Donaciano Eva, MD

## 2014-10-31 NOTE — Patient Instructions (Signed)
We will call you once Dr. Denman George has spoke to Dr. Marko Plume and Dr. Sondra Come.  Please call us with any questions or concerns you have.

## 2014-11-06 ENCOUNTER — Other Ambulatory Visit (INDEPENDENT_AMBULATORY_CARE_PROVIDER_SITE_OTHER): Payer: Medicare Other

## 2014-11-06 DIAGNOSIS — N184 Chronic kidney disease, stage 4 (severe): Secondary | ICD-10-CM

## 2014-11-06 LAB — BASIC METABOLIC PANEL
BUN: 37 mg/dL — AB (ref 6–23)
CHLORIDE: 109 meq/L (ref 96–112)
CO2: 24 meq/L (ref 19–32)
Calcium: 9.1 mg/dL (ref 8.4–10.5)
Creatinine, Ser: 2.86 mg/dL — ABNORMAL HIGH (ref 0.40–1.20)
GFR: 20.4 mL/min — AB (ref >60.00)
Glucose, Bld: 103 mg/dL — ABNORMAL HIGH (ref 70–99)
POTASSIUM: 3.6 meq/L (ref 3.5–5.1)
SODIUM: 140 meq/L (ref 135–145)

## 2014-11-07 ENCOUNTER — Encounter: Payer: Self-pay | Admitting: *Deleted

## 2014-11-07 ENCOUNTER — Encounter: Payer: Self-pay | Admitting: Family Medicine

## 2014-11-07 ENCOUNTER — Ambulatory Visit (INDEPENDENT_AMBULATORY_CARE_PROVIDER_SITE_OTHER): Payer: Medicare Other | Admitting: Family Medicine

## 2014-11-07 VITALS — BP 158/82 | HR 89 | Temp 98.4°F | Wt 183.2 lb

## 2014-11-07 DIAGNOSIS — E1122 Type 2 diabetes mellitus with diabetic chronic kidney disease: Secondary | ICD-10-CM

## 2014-11-07 DIAGNOSIS — N189 Chronic kidney disease, unspecified: Secondary | ICD-10-CM

## 2014-11-07 DIAGNOSIS — N179 Acute kidney failure, unspecified: Secondary | ICD-10-CM

## 2014-11-07 DIAGNOSIS — I10 Essential (primary) hypertension: Secondary | ICD-10-CM | POA: Diagnosis not present

## 2014-11-07 DIAGNOSIS — C541 Malignant neoplasm of endometrium: Secondary | ICD-10-CM

## 2014-11-07 DIAGNOSIS — N184 Chronic kidney disease, stage 4 (severe): Secondary | ICD-10-CM

## 2014-11-07 MED ORDER — LISINOPRIL 5 MG PO TABS: 5.0000 mg | ORAL_TABLET | Freq: Every day | ORAL | Status: DC

## 2014-11-07 NOTE — Assessment & Plan Note (Signed)
Well-controlled on current regimen. ?

## 2014-11-07 NOTE — Assessment & Plan Note (Signed)
Pre renal cause of ARF. Improved control with D/C of diuretic and increase in fluid intake.

## 2014-11-07 NOTE — Assessment & Plan Note (Signed)
Inadequate control off HCTZ. Continue amlodipine and metoprolol. Will start low dose lisinopril to get renal protection.. Follow Cr closely as she has had elevated Cr with enalapril higher dose in past.

## 2014-11-07 NOTE — Progress Notes (Signed)
Subjective:    Patient ID: Doris [name], female    DOB: 16-Mar-1933, 81 y.o.   MRN: [id number]  HPI   81 y.o. 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 + LVSI, 99% myometrial invasion, positive omentum and positive pelvic lymph nodes.  She presents for follow up on chronic kidney disease with acute exacerbation.  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  At last OV her renal function remained low.. This would be a limiting factor as to whether she could undergo chemotherapy.  Since last OIV she has continued to hold HCTZ and has increased her water intake.  Today her Cr has gone from  4.2  On 5/29 to 4.40  On 6/2 and now to  2.86 on 6/16!  Follow up wit Dr. Marko Plume in 1-2 weeks.  She reports great appetite and feeling better overall. Itching has resolved.  Hypertension:    BP remains elevated off  HCTZ despite amlodipine and metoprolol. BP Readings from Last 3 Encounters:  11/07/14 158/82  10/31/14 150/68  10/24/14 150/64  Using medication without problems or lightheadedness:None Chest pain with exertion:none Edema:None Short of breath:None Average home BPs: Not checking Other issues:  Diabetes :   Well controlled.  If CBG low and not eating she held insulin. Lab Results  Component Value Date   HGBA1C 6.7* 09/09/2014  Using medications without difficulties: Hypoglycemic episodes: None Hyperglycemic episodes:None Feet problems:None Blood Sugars averaging: FBS 103 2 hours post prandial 153        Review of Systems  Constitutional: Negative for fever and fatigue.  HENT: Negative for ear pain.   Eyes: Negative for pain.  Respiratory: Negative for chest tightness and shortness of breath.   Cardiovascular: Negative for chest pain, palpitations and leg swelling.  Gastrointestinal: Negative for abdominal pain.  Genitourinary: Negative for dysuria.       Objective:   Physical Exam  Constitutional: Vital signs are normal. She appears well-developed and well-nourished. She is cooperative.  Non-toxic appearance. She does not appear ill. No distress.  HENT:  Head: Normocephalic.  Right Ear: Hearing, tympanic membrane, external ear and ear canal normal. Tympanic membrane is not erythematous, not retracted and not bulging.  Left Ear: Hearing, tympanic membrane, external ear and ear canal normal. Tympanic membrane is not erythematous, not retracted and not bulging.  Nose: No mucosal edema or rhinorrhea. Right sinus exhibits no maxillary sinus tenderness and no frontal sinus tenderness. Left sinus exhibits no maxillary sinus tenderness and no frontal sinus tenderness.  Mouth/Throat: Uvula is midline, oropharynx is clear and moist and mucous membranes are normal.  Eyes: Conjunctivae, EOM and lids are normal. Pupils are equal, round, and reactive to light. Lids are everted and swept, no foreign bodies found.  Neck: Trachea normal and normal range of motion. Neck supple. Carotid bruit is not present. No thyroid mass and no thyromegaly present.  Cardiovascular: Normal rate, regular rhythm, S1 normal, S2 normal, normal heart sounds, intact distal pulses and normal pulses.  Exam reveals no gallop and no friction rub.   No murmur heard. Pulmonary/Chest: Effort normal and breath sounds normal. No tachypnea. No respiratory distress. She has no decreased breath sounds. She has no wheezes. She has no rhonchi. She has no rales.  Abdominal: Soft. Normal appearance and bowel sounds are normal. There is no tenderness.  Neurological: She is alert.  Skin: Skin is warm, dry and intact. No rash noted.  Psychiatric: Her speech  is normal and behavior is normal. Judgment and thought content normal. Her mood appears not anxious. Cognition and memory are normal. She does not exhibit a depressed mood.          Assessment & Plan:

## 2014-11-07 NOTE — Progress Notes (Signed)
Pre visit review using our clinic review tool, if applicable. No additional management support is needed unless otherwise documented below in the visit note. 

## 2014-11-07 NOTE — Assessment & Plan Note (Signed)
Recommend ACEI for kidney protection.

## 2014-11-07 NOTE — Patient Instructions (Addendum)
Continue amlodipine and metoprolol.  keep doing a great job keeping up fluids.  Start low dose lisinopril daily.  Follow blood pressure at home.  have labs done with Dr. Marko Plume as planned.  Follow up in 2 weeks for BP check.

## 2014-11-09 ENCOUNTER — Other Ambulatory Visit: Payer: Self-pay | Admitting: Oncology

## 2014-11-11 ENCOUNTER — Other Ambulatory Visit: Payer: Self-pay | Admitting: Gynecologic Oncology

## 2014-11-11 DIAGNOSIS — C541 Malignant neoplasm of endometrium: Secondary | ICD-10-CM

## 2014-11-13 ENCOUNTER — Ambulatory Visit (HOSPITAL_BASED_OUTPATIENT_CLINIC_OR_DEPARTMENT_OTHER): Payer: Medicare Other | Admitting: Oncology

## 2014-11-13 ENCOUNTER — Telehealth: Payer: Self-pay | Admitting: Oncology

## 2014-11-13 ENCOUNTER — Encounter: Payer: Self-pay | Admitting: Oncology

## 2014-11-13 ENCOUNTER — Other Ambulatory Visit (HOSPITAL_BASED_OUTPATIENT_CLINIC_OR_DEPARTMENT_OTHER): Payer: Medicare Other

## 2014-11-13 VITALS — BP 157/66 | HR 86 | Temp 98.2°F | Resp 18 | Ht 67.0 in | Wt 183.1 lb

## 2014-11-13 DIAGNOSIS — N184 Chronic kidney disease, stage 4 (severe): Principal | ICD-10-CM

## 2014-11-13 DIAGNOSIS — E1122 Type 2 diabetes mellitus with diabetic chronic kidney disease: Secondary | ICD-10-CM | POA: Diagnosis not present

## 2014-11-13 DIAGNOSIS — C541 Malignant neoplasm of endometrium: Secondary | ICD-10-CM

## 2014-11-13 DIAGNOSIS — C55 Malignant neoplasm of uterus, part unspecified: Secondary | ICD-10-CM

## 2014-11-13 LAB — CBC WITH DIFFERENTIAL/PLATELET
BASO%: 0.4 % (ref 0.0–2.0)
Basophils Absolute: 0.1 10*3/uL (ref 0.0–0.1)
EOS ABS: 0.2 10*3/uL (ref 0.0–0.5)
EOS%: 1.6 % (ref 0.0–7.0)
HCT: 30.4 % — ABNORMAL LOW (ref 34.8–46.6)
HGB: 9.7 g/dL — ABNORMAL LOW (ref 11.6–15.9)
LYMPH%: 12.1 % — AB (ref 14.0–49.7)
MCH: 23.8 pg — AB (ref 25.1–34.0)
MCHC: 31.9 g/dL (ref 31.5–36.0)
MCV: 74.5 fL — AB (ref 79.5–101.0)
MONO#: 0.5 10*3/uL (ref 0.1–0.9)
MONO%: 4.7 % (ref 0.0–14.0)
NEUT%: 81.2 % — ABNORMAL HIGH (ref 38.4–76.8)
NEUTROS ABS: 9.4 10*3/uL — AB (ref 1.5–6.5)
Platelets: 355 10*3/uL (ref 145–400)
RBC: 4.08 10*6/uL (ref 3.70–5.45)
RDW: 16.9 % — ABNORMAL HIGH (ref 11.2–14.5)
WBC: 11.6 10*3/uL — ABNORMAL HIGH (ref 3.9–10.3)
lymph#: 1.4 10*3/uL (ref 0.9–3.3)

## 2014-11-13 LAB — BASIC METABOLIC PANEL (CC13)
ANION GAP: 7 meq/L (ref 3–11)
BUN: 29.7 mg/dL — AB (ref 7.0–26.0)
CALCIUM: 8.9 mg/dL (ref 8.4–10.4)
CHLORIDE: 109 meq/L (ref 98–109)
CO2: 26 meq/L (ref 22–29)
Creatinine: 2.6 mg/dL — ABNORMAL HIGH (ref 0.6–1.1)
EGFR: 20 mL/min/{1.73_m2} — ABNORMAL LOW (ref >90)
Glucose: 161 mg/dl — ABNORMAL HIGH (ref 70–140)
POTASSIUM: 3.9 meq/L (ref 3.5–5.1)
Sodium: 143 mEq/L (ref 136–145)

## 2014-11-13 NOTE — Telephone Encounter (Signed)
Gave avs & calendar for August. Patient will come back and pick up CT contrast.

## 2014-11-13 NOTE — Progress Notes (Signed)
OFFICE PROGRESS NOTE   November 13, 2014   [place], [place], Mervyn Gay, MD, J.McComb, Kathlyn [place], Erling Cruz  INTERVAL HISTORY:  Patient is seen, together with daughter (whom I had not met previously), in continuing attention to advanced carcinosarcoma of uterus. She had R0 surgery by Dr Alycia Rossetti at [facility] 09-30-14 and saw Dr Denman George again on 10-31-14; Dr Denman George would be in favor of some chemotherapy if possible.  Options for additional treatment very limited due to significant comorbid conditions in this 81 year old lady.  Case discussed also with [facility] pharmacist, who was present during visit today.   Patient has CKD related to diabetes, with baseline creatinine in 2"s. When I met her on 10-19-14, creatinnine was up to 4.2. With DC of diuretic and improved oral hydration, creatinine has now improved to 2.8. Dr Diona Browner is assisting and has referred her back to nephrology. Patient tells me that she has several family members on dialysis "I do not want that".  Patient has had slight NP cough for 2-3 days, without SOB or fever and no chest pain; she thinks this is from environmental factors. She has noticed slight swelling in feet and ankles, no calf pain. Appetite is some better and she is still pushing po fluids. She denies abdominal or pelvic pain and has had no bleeding. Energy is at baseline. She never completed course of prophylactic heparin after gyn onc surgery "I kept forgetting to do the shots so I just stopped". She enjoyed trip to [place], [place] with daughter and granddaughter for recent birthday. She used WC and motorized scooter on the trip.  No central catheter. Daughter very supportive during visit.    ONCOLOGIC HISTORY Patient has been followed closely for several years by PCP Dr Eliezer Lofts. She presented with ~ 6 weeks of vaginal spotting, with [place] on 08-22-14 showing endometrium 28 mm thick, with uterus 10.5 x 5.5 x 6.3 cm with 2 fibroids, and thick walled cystic lesion in left  ovary measuring 13 mm.She had a repeat ultrasound done on April 23 which revealed the endometrial stripe thickening to be 66m. There was a hypoechoic mass 7 x 3 cm seen within the endometrium into the cervix. An endometrial biopsy by Dr JArvella Nighrevealed a high-grade endometrial carcinoma with papillary architecture and serous features. She was [facility] showing small retroperitoneal/ pelvic lymph nodes, enlarged uterus with heterogeneous thickening of endometrium, bilateral ovaries not remarkable, no ascites, also gallstone, degenerative arthritis and probably chronic compression fracture L1. Creatinine on 09-09-14 was 2.88, with recent priors ~ 2.5. She had cardiology clearance prior to surgery. 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 Primary tumor was 6.6 cm with 22 / 23 mm invasion, right tube, right ovary and left tube involved, omentum diffusely involved, + LVSI, 1 left pelvic node and 1 right pelvic node involved (left ovary not involved). Post operative course was not remarkable and she was discharged home on POD #3 (10-03-14). Labs from 10-03-14 had WBC 15, Hgb 10.4, plt 328, BUN 47 and creatinine 2.39. Case was [facility], tho information in that summary did not include her renal function, with recommendation for adjuvant carboplatin and taxol, with plan to reimage  after completion of chemotherapy, then possible pelvic radiation. At medical oncology consultation on 10-19-14 she had creatinine 4.2.     Review of systems as above, also: No pain. Is voiding. No SOB walking slowly in office now. Bowels ok. Remainder of 10 point Review of Systems  negative.  Objective:  Vital signs in last 24 hours:  BP 157/66 mmHg  Pulse 86  Temp(Src) 98.2 F (36.8 C) (Oral)  Resp 18  Ht 5' 7"  (1.702 m)  Wt 183 lb 1.6 oz (83.054 kg)  BMI 28.67 kg/m2  SpO2 97% Weight stable from 10-31-14.  Alert, oriented and appropriate. Ambulatory without assistance.    HEENT:PERRL, sclerae not icteric. Oral mucosa moist without lesions, posterior pharynx clear, some dull erythema bilaterally consistent with post nasal drainage.  Neck supple. No JVD.  Lymphatics:no cervical,supraclavicular or inguinal adenopathy Resp: diminished BS thruout otherwise clear to auscultation bilaterally and normal percussion bilaterally Cardio: regular rate and rhythm. No gallop. GI: soft, nontender, not distended, no mass or organomegaly. Normally active bowel sounds. Surgical incision not remarkable. Musculoskeletal/ Extremities: without pitting edema, cords, tenderness Neuro: speech fluent and appropriate. No focal deficits. Psych appropriate mood and affect. Skin without rash, ecchymosis, petechiae Breasts: without dominant mass, skin or nipple findings. Axillae benign. Portacath-without erythema or tenderness  Lab Results:  Results for orders placed or performed in visit on 11/13/14  CBC with Differential  Result Value Ref Range   WBC 11.6 (H) 3.9 - 10.3 10e3/uL   NEUT# 9.4 (H) 1.5 - 6.5 10e3/uL   HGB 9.7 (L) 11.6 - 15.9 g/dL   HCT 30.4 (L) 34.8 - 46.6 %   Platelets 355 145 - 400 10e3/uL   MCV 74.5 (L) 79.5 - 101.0 fL   MCH 23.8 (L) 25.1 - 34.0 pg   MCHC 31.9 31.5 - 36.0 g/dL   RBC 4.08 3.70 - 5.45 10e6/uL   RDW 16.9 (H) 11.2 - 14.5 %   lymph# 1.4 0.9 - 3.3 10e3/uL   MONO# 0.5 0.1 - 0.9 10e3/uL   Eosinophils Absolute 0.2 0.0 - 0.5 10e3/uL   Basophils Absolute 0.1 0.0 - 0.1 10e3/uL   NEUT% 81.2 (H) 38.4 - 76.8 %   LYMPH% 12.1 (L) 14.0 - 49.7 %   MONO% 4.7 0.0 - 14.0 %   EOS% 1.6 0.0 - 7.0 %   BASO% 0.4 0.0 - 2.0 %   '       BMETs                                                                         11-13-14         11-06-14          10-23-14           Sodium 136 - 145 mEq/L 143 140R 140R    Potassium 3.5 - 5.1 mEq/L 3.9 3.6 4.0    Chloride 98 - 109 mEq/L 109 109R 105R    CO2 22 - 29 mEq/L 26 24R 25R    Glucose 70 - 140 mg/dl 161 (H) 103 (H)R 121 (H)R    BUN 7.0 - 26.0 mg/dL 29.7 (H) 37 (H)R 60 (H)R    Creatinine 0.6 - 1.1 mg/dL 2.6 (H) 2.86 (H)R 4.40 (  H)R    Calcium 8.4 - 10.4 mg/dL 8.9 9.1R 9.1R    Anion Gap 3 - 11 mEq/L 7      EGFR >90 ml/min/1.73 m2 20 (L)     Comments: eGFR is calculated using the CKD-EPI            Studies/Results:  No results found.  Medications: I have reviewed the patient's current medications.   DISCUSSION:  Knowing that Dr Denman George prefers adjuvant chemotherapy, prior to this visit I have reviewed chemotherapy options (taxol carbo, taxol, avastin, ifosfamide, ixabepilone, weekly docetaxel, topotecan, oxaliplatin, oral VP16, doxil or adria).   Carefully reviewed with patient and daughter that her cancer is very aggressive based on the sarcoma component and serous/ clear cell carcinoma component, with extensive involvement of uterus, omentum, lymph nodes/ LVSI at surgery. Fortunately the surgery was able to remove all visible tumor, tho likelihood is very high that at least microscopic disease remains and will grow at some point. We have discussed fact that even best aggressive chemotherapy often does not keep this type of malignancy from recurring, and that, due to chronic kidney disease, she is not able to have the types of chemo that would be first line in adjuvant setting. While we could attempt other single agent chemotherapy in adjuvant attempt, this also could have significant side effects and there is no way to know if it would actually improve the cancer situation. She and daughter have significant reservations re chemotherapy even without considering her specific contraindications, particularly  as another daughter had a very difficult time with chemo for another gyn malignancy, and died during treatment. As patient is feeling generally well now, and given these concerns, she much prefers to be followed with observation rather than attempting chemotherapy now. She understands that if/ when the cancer recurs, we can reconsider treatment in palliative attempt depending on her wishes and situation then. I will see her back with CT (no IV contrast) in 2-3 months.  She is to see Dr Sondra Come on 12-03-14, as requested by Dr Denman George, for consideration of radiation to decrease chance of local recurrence in pelvis/ vaginal area.   I have encouraged her to keep appointment as scheduled with Dr Erling Cruz, even tho she is very clear that she would not want dialysis.   Patient and daughter understand that I will let other MDs know of our discussion today. They can call at any time prior to next scheduled visit if questions or concerns.   Assessment/Plan: 1.IVB carcinosarcoma of uterus: post R0 debulking 09-30-14, with path finding of more extensive disease than was visually apparent. Recommendation was adjuvant chemotherapy with carboplatin and taxol, however renal function is too poor even at baseline for platinum drugs (or ifosfamide). After lengthy discussion today, we will follow with observation rather than attempting any systemic treatment now.  2.Chronic kidney disease: creatinine a little lower today than when I saw her last, but EGFR still only 20. 3.diabetes x 25 years on insulin 4.long past tobacco. No significant exam findings associated with recent slight cough. 5.degenerative arthritis 6.urine culture negative 10-17-14 7.HTN, but no indication for ETT prior to surgery per cardiology 8.advance directives not completed tho she has information   All questions answered; patient and daughter seem to understand discussion well and are in agreement with plans as above. Time spent 30 min including  >50% counseling and coordination of care.    LIVESAY,LENNIS P, MD   11/13/2014, 9:18 AM

## 2014-11-14 DIAGNOSIS — E1122 Type 2 diabetes mellitus with diabetic chronic kidney disease: Secondary | ICD-10-CM | POA: Insufficient documentation

## 2014-11-14 DIAGNOSIS — N189 Chronic kidney disease, unspecified: Secondary | ICD-10-CM | POA: Insufficient documentation

## 2014-11-21 ENCOUNTER — Ambulatory Visit (INDEPENDENT_AMBULATORY_CARE_PROVIDER_SITE_OTHER): Payer: Medicare Other | Admitting: Family Medicine

## 2014-11-21 ENCOUNTER — Encounter: Payer: Self-pay | Admitting: Family Medicine

## 2014-11-21 VITALS — BP 142/58 | HR 77 | Temp 98.7°F | Ht 67.0 in | Wt 179.8 lb

## 2014-11-21 DIAGNOSIS — J209 Acute bronchitis, unspecified: Secondary | ICD-10-CM | POA: Diagnosis not present

## 2014-11-21 DIAGNOSIS — N179 Acute kidney failure, unspecified: Secondary | ICD-10-CM

## 2014-11-21 DIAGNOSIS — I10 Essential (primary) hypertension: Secondary | ICD-10-CM | POA: Diagnosis not present

## 2014-11-21 DIAGNOSIS — B309 Viral conjunctivitis, unspecified: Secondary | ICD-10-CM | POA: Insufficient documentation

## 2014-11-21 DIAGNOSIS — N189 Chronic kidney disease, unspecified: Secondary | ICD-10-CM

## 2014-11-21 MED ORDER — AZITHROMYCIN 250 MG PO TABS: ORAL_TABLET | ORAL | Status: DC

## 2014-11-21 NOTE — Assessment & Plan Note (Signed)
Improved control with addition of lisinopril. I hesitate to try to force her BP < 169 systolic given lower diastolic and pt elderly age.  Contiue to follow for now.

## 2014-11-21 NOTE — Progress Notes (Signed)
Pre visit review using our clinic review tool, if applicable. No additional management support is needed unless otherwise documented below in the visit note. 

## 2014-11-21 NOTE — Assessment & Plan Note (Signed)
Treat with antibiotics given pt immunocomp state, age and > 2 weeks of symptoms. Eye symptoms are likely viral.

## 2014-11-21 NOTE — Patient Instructions (Addendum)
Continue current medications for BP: amlodipine, metoprolol and lisinopril.  Work on healthy eating and try to exercise with walking when you can as able. Complete a course of antibiotics.  Mucinex DM twice daily for cough.  Call if not improving, or go to ER oif severe shortness of breath.

## 2014-11-21 NOTE — Assessment & Plan Note (Signed)
Symptomatic care 

## 2014-11-21 NOTE — Progress Notes (Signed)
Subjective:    Patient ID: Doris [name], female    DOB: 1933-04-20, 81 y.o.   MRN: [id number]  HPI   81 y.o. 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 + LVSI, 99% myometrial invasion, positive omentum and positive pelvic lymph nodes. She presents for follow up on HTN.  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  Restarted lisinopril low dose on 6/17. BMET on 6/23 re-eval showed  Improved renal function on ACEI. Today her BP control is improved. BP Readings from Last 3 Encounters:  11/21/14 142/58  11/13/14 157/66  11/07/14 158/82  At home she reports 150/70s.  She has appt to see nephrologist in 12/2014.  She is not doing chemo for breast cancer. Has appt with radiation oncologist  In July.    She has had cough and congestion  for 2 weeks, no better. Cough syrup. Helps a little, but cough keeping up at night.  NO fever.  NO SOB, no wheezing.   Using OTC   Sore throat off and on.  Red itchy right eye, small amount of discharge. No face or ear pain.   Social History /Family History/Past Medical History reviewed and updated if needed.   Review of Systems  Constitutional: Negative for fever and fatigue.  HENT: Negative for ear pain.   Eyes: Negative for pain.  Respiratory: Negative for chest tightness and shortness of breath.   Cardiovascular: Negative for chest pain, palpitations and leg swelling.  Gastrointestinal: Negative for abdominal pain.  Genitourinary: Negative for dysuria.       Objective:   Physical Exam  Constitutional: Vital signs are normal. She appears well-developed and well-nourished. She is cooperative.  Non-toxic appearance. She does not appear ill. No distress.  HENT:  Head: Normocephalic.    Right Ear: Hearing, tympanic membrane, external ear and ear canal normal. Tympanic membrane is not erythematous, not retracted and not bulging.  Left Ear: Hearing, tympanic membrane, external ear and ear canal normal. Tympanic membrane is not erythematous, not retracted and not bulging.  Nose: Mucosal edema and rhinorrhea present. Right sinus exhibits no maxillary sinus tenderness and no frontal sinus tenderness. Left sinus exhibits no maxillary sinus tenderness and no frontal sinus tenderness.  Mouth/Throat: Uvula is midline, oropharynx is clear and moist and mucous membranes are normal.  Eyes: EOM are normal. Pupils are equal, round, and reactive to light. Lids are everted and swept, no foreign bodies found. Right eye exhibits discharge. Right conjunctiva is injected.  Neck: Trachea normal and normal range of motion. Neck supple. Carotid bruit is not present. No thyroid mass and no thyromegaly present.  Cardiovascular: Normal rate, regular rhythm, S1 normal, S2 normal, normal heart sounds, intact distal pulses and normal pulses.  Exam reveals no gallop and no friction rub.   No murmur heard. Pulmonary/Chest: Effort normal and breath sounds normal. No tachypnea. No respiratory distress. She has no decreased breath sounds. She has no wheezes. She has no rhonchi. She has no rales.  Frequent cough, productive  Neurological: She is alert.  Skin: Skin is warm, dry and intact. No rash noted.  Psychiatric: Her speech is normal and behavior is normal. Judgment normal. Her mood appears not anxious. Cognition and memory are normal. She does not exhibit a depressed mood.  Assessment & Plan:

## 2014-11-21 NOTE — Addendum Note (Signed)
Addended byEliezer Lofts E on: 11/21/2014 11:45 AM   Modules accepted: SmartSet

## 2014-11-21 NOTE — Assessment & Plan Note (Signed)
Now resolved and back to baseline. Remains good despite restarting lisinopril.

## 2014-11-27 ENCOUNTER — Encounter: Payer: Self-pay | Admitting: Radiation Oncology

## 2014-11-27 NOTE — Progress Notes (Signed)
Radiation Oncology         [phone]) [phone] ________________________________  Initial outpatient Consultation  Name: Doris [name] MRN: [id number]  Date: 11/28/2014  DOB: 06/11/1932  CC:Doris Lofts, MD  Doris Gibbs, NP   REFERRING PHYSICIAN: Dorothyann Gibbs, NP  DIAGNOSIS: IVB carcinosarcoma of uterus   HISTORY OF PRESENT ILLNESS::Doris [name] is a 81 y.o. female who is seen out courtesy of Dr. Denman George for an opinion concerning radiation therapy as part of management of patient's advanced carcinosarcoma of the uterus.. Patient earlier this year presented with postmenopausal vaginal bleeding. She was seen by Dr. Patrecia Pace and endometrial biopsy revealed high-grade endometrial carcinoma with papillary architecture and serous features. Patient was referred to Dr. Alycia Rossetti and underwent TAH/ BSO, pelvic lymph node evaluation and partial omentectomy. Pathology from [facility] [phone]) found carcinosarcoma of uterus, with homologous sarcomatous component and mixed serous/ clear cell FIGO grade 3 carcinoma. Primary tumor was 6.6 cm with 22 / 23 mm invasion, right tube, right ovary and left tube involved, omentum diffusely involved, + LVSI, 1 left pelvic node and 1 right pelvic node involved (left ovary not involved). Post operative course was not remarkable and she was discharged home on POD #3 (10-03-14).  Patient was presented at the multidisciplinary gynecological oncology conference at [facility]. Recommendations for systemic chemotherapy, repeat imaging and consideration for pelvic radiation therapy. Patient was seen by Dr. Marko Plume and renal function showed significant insufficiency which would not permit full dose chemotherapy. Given the potential for pelvic recurrence and associated morbidity with a pelvic recurrence, the patient is now referred to radiation oncology for consideration for palliative pelvic radiation therapy.  PREVIOUS RADIATION THERAPY: No  PAST MEDICAL HISTORY:  has a past  medical history of Diabetes mellitus; Hyperlipidemia; Hypertension; Arthritis; Seizures; Enlarged heart; and Endometrial cancer.    PAST SURGICAL HISTORY: Past Surgical History  Procedure Laterality Date  . Appendectomy    . Spine surgery      neck  . Tonsillectomy    . Total abdominal hysterectomy w/ bilateral salpingoophorectomy  09/30/14    TOTAL ABDOMINAL HYSTER, INCL PARTIAL VAGINECTOMY, W/PARA-AORTIC & PELVIC LYMPH NODE BX, W/WO REM TUBE/OVARY  . Omentectomy  09/30/14    OMENTECTOMY, EPIPLOECTOMY, RESECTION OF OMENTUM    FAMILY HISTORY: family history includes Heart attack in her father; Hypertension in her mother; Ovarian cancer (age of onset: 62) in her daughter.  SOCIAL HISTORY:  reports that she quit smoking about 4 years ago. Her smoking use included Cigarettes. She has a 30 pack-year smoking history. She has never used smokeless tobacco. She reports that she does not drink alcohol or use illicit drugs.  ALLERGIES: Review of patient's allergies indicates no known allergies.  MEDICATIONS:  Current Outpatient Prescriptions  Medication Sig Dispense Refill  . amLODipine (NORVASC) 10 MG tablet Take 1 tablet (10 mg total) by mouth daily. 90 tablet 3  . atorvastatin (LIPITOR) 80 MG tablet TAKE ONE TABLET BY MOUTH ONCE DAILY 30 tablet 10  . COMBIGAN 0.2-0.5 % ophthalmic solution Place 1 drop into both eyes daily.     . insulin NPH-regular Human (NOVOLIN 70/30) (70-30) 100 UNIT/ML injection Inject 25 units in AM and 20 UNits in PM with meals.    . Lancets MISC by Does not apply route.      . metoprolol (TOPROL-XL) 200 MG 24 hr tablet TAKE ONE TABLET BY MOUTH ONCE DAILY 90 tablet 3  . ONE TOUCH ULTRA TEST test strip USE ONE TEST STRIP TO CHECK BLOOD SUGAR EVERY  DAY 50 each 3  . hydrochlorothiazide (HYDRODIURIL) 25 MG tablet TAKE ONE TABLET BY MOUTH EVERY DAY (Patient not taking: Reported on 11/28/2014) 90 tablet 3  . lisinopril (PRINIVIL,ZESTRIL) 5 MG tablet Take 1 tablet (5 mg total) by  mouth daily. (Patient not taking: Reported on 11/28/2014) 30 tablet 3   No current facility-administered medications for this encounter.    REVIEW OF SYSTEMS:  A 15 point review of systems is documented in the electronic medical record. This was obtained by the nursing staff. However, I reviewed this with the patient to discuss relevant findings and make appropriate changes.  No pelvic pain or vaginal bleeding. Patient denies any urination difficulties or bowel complaints. She does have intermittent swelling in her ankles and feet.   PHYSICAL EXAM:  height is 5' 8.5" (1.74 m) and weight is 187 lb 3.2 oz (84.913 kg). Her oral temperature is 98.5 F (36.9 C). Her blood pressure is 186/70 and her pulse is 88. Her respiration is 16.   BP 186/70 mmHg  Pulse 88  Temp(Src) 98.5 F (36.9 C) (Oral)  Resp 16  Ht 5' 8.5" (1.74 m)  Wt 187 lb 3.2 oz (84.913 kg)  BMI 28.05 kg/m2  General Appearance:    Alert, cooperative, no distress, appears younger than stated age, accompanied by daughter and granddaughter on evaluation today   Head:    Normocephalic, without obvious abnormality, atraumatic  Eyes:    PERRL, conjunctiva/corneas clear, EOM's intact,     Ears:    Normal TM's and external ear canals, both ears  Nose:   Nares normal, septum midline, mucosa normal, no drainage    or sinus tenderness  Throat:   Lips, mucosa, and tongue normal; dentures in place gums normal  Neck:   Supple, symmetrical, trachea midline, no adenopathy;    thyroid:  no enlargement/tenderness/nodules; no carotid   bruit or JVD  Back:     Symmetric, no curvature, ROM normal, no CVA tenderness  Lungs:     Clear to auscultation bilaterally, respirations unlabored  Chest Wall:    No tenderness or deformity   Heart:    Regular rate and rhythm, S1 and S2 normal, no murmur, rub   or gallop  Breast Exam:    deferred   Abdomen:     Soft, non-tender, bowel sounds active all four quadrants,    no masses, no organomegaly, well-healed  vertical and horizontal scar from her recent surgery   Genitalia:    deferred   Rectal:   deferred   Extremities:   Extremities normal, atraumatic, no cyanosis, some  Edema noted in ankle and foot areas bilaterally   Pulses:   2+ and symmetric all extremities  Skin:   Skin color, texture, turgor normal, no rashes or lesions  Lymph nodes:   Cervical, supraclavicular, and axillary nodes normal  Neurologic:   normal strength, sensation and reflexes    throughout     ECOG = 1    1 - Symptomatic but completely ambulatory (Restricted in physically strenuous activity but ambulatory and able to carry out work of a light or sedentary nature. For example, light housework, office work)  LABORATORY DATA:  Lab Results  Component Value Date   WBC 11.6* 11/13/2014   HGB 9.7* 11/13/2014   HCT 30.4* 11/13/2014   MCV 74.5* 11/13/2014   PLT 355 11/13/2014   NEUTROABS 9.4* 11/13/2014   Lab Results  Component Value Date   NA 143 11/13/2014   K 3.9 11/13/2014  CL 109 11/06/2014   CO2 26 11/13/2014   GLUCOSE 161* 11/13/2014   CREATININE 2.6* 11/13/2014   CALCIUM 8.9 11/13/2014      RADIOGRAPHY: No results found.    IMPRESSION: IVB carcinosarcoma of uterus. Patient is status post surgery with pathologic findings significant for diffuse involvement of omentum with small foci of metastatic carcinoma. Unfortunately due to renal function patient is not a candidate for aggressive chemotherapy.  I discussed consideration for palliative radiation therapy directed at the pelvis area prevent local recurrence and associated problems with this issue. We discussed the course of treatment side effects and potential toxicities of pelvic radiation therapy. At this time the patient is undecided whether she would like to proceed with radiation therapy as part of her postoperative management. She will discuss further with her family members.  PLAN: The Patient will call back if she decides to proceed with  palliative pelvic radiation therapy. She will continue close follow-up with gynecologic oncology otherwise.    ------------------------------------------------  Blair Promise, PhD, MD

## 2014-11-27 NOTE — Progress Notes (Signed)
GYN Location of Tumor / Histology:  Stage IVB carcinosarcoma of the uterus with positive lymphovascular space invasion, 22/23 mm (99%) of myometrial invasion and positive pelvic lymph nodes (one from each left and right) and positive omentum (microscopic foci).  Doris [name] presented with ~ 6 weeks of vaginal spotting.  Biopsies revealed:   09/30/14 A: Uterus, cervix, bilateral tubes and ovaries, total hysterectomy and bilateral salpingo-oophorectomy  - Carcinosarcoma, homologous type, consisting of homologous sarcomatous component and mixed serous and clear cell carcinoma components, FIGO grade 3 (see synoptic)  FSB, B: Lymph node, left pelvic, regional resection  - One lymph node involved by metastatic carcinoma (1/1), with largest focus of metastasis 1.1 cm  C: Lymph node, left pelvic, regional resection  - One benign lymph node (0/1)  D: Lymph node, right pelvic, regional resection  - One lymph node involved by metastatic carcinoma (1/1), with largest focus of metastasis less than 0.1 cm  E: Omentum, omentectomy  - Diffusely involved by small foci of metastatic carcinoma  Past/Anticipated interventions by Gyn/Onc surgery, if any: 09/30/14   Past/Anticipated interventions by medical oncology, if any: Per Dr. Marko Plume "Recommendation was adjuvant chemotherapy with carboplatin and taxol, however renal function is too poor even at baseline for platinum drugs (or ifosfamide). After lengthy discussion today, we will follow with observation rather than attempting any systemic treatment now."   Weight changes, if any: no reports she did have a poor appetite for awhile but it is better now.  Bowel/Bladder complaints, if any: denies bowel and bladder issues, denies vaginal bledding   Nausea/Vomiting, if any: no  Pain issues, if any:  no  SAFETY ISSUES:  Prior radiation? no  Pacemaker/ICD? no  Possible current pregnancy? no  Is the patient on methotrexate? no  Current Complaints  / other details:  Patient is here with her daughter and granddaughter.  One of her daughters had ovarian cancer and passed away at age 69.  BP 186/70 mmHg  Pulse 88  Temp(Src) 98.5 F (36.9 C) (Oral)  Resp 16  Ht 5' 8.5" (1.74 m)  Wt 187 lb 3.2 oz (84.913 kg)  BMI 28.05 kg/m2

## 2014-11-28 ENCOUNTER — Ambulatory Visit
Admission: RE | Admit: 2014-11-28 | Discharge: 2014-11-28 | Disposition: A | Discharge status: Home / Self Care | Payer: Medicare Other | Source: Ambulatory Visit | Attending: Radiation Oncology | Admitting: Radiation Oncology

## 2014-11-28 ENCOUNTER — Encounter: Payer: Self-pay | Admitting: Radiation Oncology

## 2014-11-28 VITALS — BP 186/70 | HR 88 | Temp 98.5°F | Resp 16 | Ht 68.5 in | Wt 187.2 lb

## 2014-11-28 DIAGNOSIS — E119 Type 2 diabetes mellitus without complications: Secondary | ICD-10-CM | POA: Diagnosis not present

## 2014-11-28 DIAGNOSIS — Z510 Encounter for antineoplastic radiation therapy: Secondary | ICD-10-CM | POA: Diagnosis not present

## 2014-11-28 DIAGNOSIS — C55 Malignant neoplasm of uterus, part unspecified: Secondary | ICD-10-CM

## 2014-11-28 DIAGNOSIS — C541 Malignant neoplasm of endometrium: Secondary | ICD-10-CM

## 2014-11-28 DIAGNOSIS — Z8041 Family history of malignant neoplasm of ovary: Secondary | ICD-10-CM | POA: Insufficient documentation

## 2014-11-28 NOTE — Progress Notes (Signed)
Please see the Nurse Progress Note in the MD Initial Consult Encounter for this patient. 

## 2014-12-03 ENCOUNTER — Ambulatory Visit: Payer: Medicare Other

## 2014-12-03 ENCOUNTER — Ambulatory Visit: Payer: Medicare Other | Admitting: Radiation Oncology

## 2015-01-05 ENCOUNTER — Telehealth: Payer: Self-pay | Admitting: *Deleted

## 2015-01-05 ENCOUNTER — Encounter (HOSPITAL_COMMUNITY): Payer: Self-pay

## 2015-01-05 ENCOUNTER — Ambulatory Visit (HOSPITAL_COMMUNITY)
Admission: RE | Admit: 2015-01-05 | Discharge: 2015-01-05 | Disposition: A | Discharge status: Home / Self Care | Payer: Medicare Other | Source: Ambulatory Visit | Attending: Oncology | Admitting: Oncology

## 2015-01-05 DIAGNOSIS — C541 Malignant neoplasm of endometrium: Secondary | ICD-10-CM | POA: Insufficient documentation

## 2015-01-05 DIAGNOSIS — K769 Liver disease, unspecified: Secondary | ICD-10-CM | POA: Insufficient documentation

## 2015-01-05 DIAGNOSIS — J90 Pleural effusion, not elsewhere classified: Secondary | ICD-10-CM | POA: Insufficient documentation

## 2015-01-05 NOTE — Telephone Encounter (Signed)
THE REPORT WAS READ AND FAXED TO TRIAGE. NOTIFIED DR.LIVESAY'S NURSE, KRISTIN AND REPORT PLACED ON DR.LIVESAY'S DESK.

## 2015-01-07 ENCOUNTER — Other Ambulatory Visit: Payer: Self-pay

## 2015-01-07 ENCOUNTER — Other Ambulatory Visit: Payer: Self-pay | Admitting: Oncology

## 2015-01-07 DIAGNOSIS — C541 Malignant neoplasm of endometrium: Secondary | ICD-10-CM

## 2015-01-08 ENCOUNTER — Telehealth: Payer: Self-pay

## 2015-01-08 ENCOUNTER — Telehealth: Payer: Self-pay | Admitting: Oncology

## 2015-01-08 ENCOUNTER — Encounter: Payer: Self-pay | Admitting: Oncology

## 2015-01-08 ENCOUNTER — Other Ambulatory Visit (HOSPITAL_BASED_OUTPATIENT_CLINIC_OR_DEPARTMENT_OTHER): Payer: Medicare Other

## 2015-01-08 ENCOUNTER — Ambulatory Visit (HOSPITAL_BASED_OUTPATIENT_CLINIC_OR_DEPARTMENT_OTHER): Payer: Medicare Other | Admitting: Oncology

## 2015-01-08 ENCOUNTER — Ambulatory Visit (HOSPITAL_COMMUNITY)
Admission: RE | Admit: 2015-01-08 | Discharge: 2015-01-08 | Disposition: A | Discharge status: Home / Self Care | Payer: Medicare Other | Source: Ambulatory Visit | Attending: Oncology | Admitting: Oncology

## 2015-01-08 VITALS — BP 176/77 | HR 97 | Temp 98.2°F | Resp 17 | Ht 68.5 in | Wt 182.1 lb

## 2015-01-08 DIAGNOSIS — J90 Pleural effusion, not elsewhere classified: Secondary | ICD-10-CM

## 2015-01-08 DIAGNOSIS — N189 Chronic kidney disease, unspecified: Secondary | ICD-10-CM | POA: Diagnosis not present

## 2015-01-08 DIAGNOSIS — C541 Malignant neoplasm of endometrium: Secondary | ICD-10-CM

## 2015-01-08 DIAGNOSIS — Z87891 Personal history of nicotine dependence: Secondary | ICD-10-CM

## 2015-01-08 DIAGNOSIS — E119 Type 2 diabetes mellitus without complications: Secondary | ICD-10-CM | POA: Diagnosis not present

## 2015-01-08 DIAGNOSIS — R0602 Shortness of breath: Secondary | ICD-10-CM | POA: Diagnosis present

## 2015-01-08 DIAGNOSIS — C55 Malignant neoplasm of uterus, part unspecified: Secondary | ICD-10-CM

## 2015-01-08 DIAGNOSIS — R918 Other nonspecific abnormal finding of lung field: Secondary | ICD-10-CM | POA: Diagnosis not present

## 2015-01-08 DIAGNOSIS — J189 Pneumonia, unspecified organism: Secondary | ICD-10-CM

## 2015-01-08 DIAGNOSIS — N184 Chronic kidney disease, stage 4 (severe): Secondary | ICD-10-CM

## 2015-01-08 DIAGNOSIS — I10 Essential (primary) hypertension: Secondary | ICD-10-CM

## 2015-01-08 LAB — CBC WITH DIFFERENTIAL/PLATELET
BASO%: 0.2 % (ref 0.0–2.0)
Basophils Absolute: 0 10*3/uL (ref 0.0–0.1)
EOS%: 1.5 % (ref 0.0–7.0)
Eosinophils Absolute: 0.2 10*3/uL (ref 0.0–0.5)
HCT: 32.5 % — ABNORMAL LOW (ref 34.8–46.6)
HGB: 10.2 g/dL — ABNORMAL LOW (ref 11.6–15.9)
LYMPH%: 11.9 % — AB (ref 14.0–49.7)
MCH: 23.1 pg — ABNORMAL LOW (ref 25.1–34.0)
MCHC: 31.5 g/dL (ref 31.5–36.0)
MCV: 73.4 fL — ABNORMAL LOW (ref 79.5–101.0)
MONO#: 0.7 10*3/uL (ref 0.1–0.9)
MONO%: 4.9 % (ref 0.0–14.0)
NEUT%: 81.5 % — AB (ref 38.4–76.8)
NEUTROS ABS: 10.9 10*3/uL — AB (ref 1.5–6.5)
Platelets: 503 10*3/uL — ABNORMAL HIGH (ref 145–400)
RBC: 4.43 10*6/uL (ref 3.70–5.45)
RDW: 18.5 % — ABNORMAL HIGH (ref 11.2–14.5)
WBC: 13.4 10*3/uL — AB (ref 3.9–10.3)
lymph#: 1.6 10*3/uL (ref 0.9–3.3)

## 2015-01-08 LAB — COMPREHENSIVE METABOLIC PANEL (CC13)
ALT: 12 U/L (ref 0–55)
AST: 8 U/L (ref 5–34)
Albumin: 3.3 g/dL — ABNORMAL LOW (ref 3.5–5.0)
Alkaline Phosphatase: 89 U/L (ref 40–150)
Anion Gap: 11 mEq/L (ref 3–11)
BUN: 41.6 mg/dL — AB (ref 7.0–26.0)
CHLORIDE: 109 meq/L (ref 98–109)
CO2: 23 meq/L (ref 22–29)
CREATININE: 2.6 mg/dL — AB (ref 0.6–1.1)
Calcium: 9.5 mg/dL (ref 8.4–10.4)
EGFR: 19 mL/min/{1.73_m2} — ABNORMAL LOW (ref >90)
GLUCOSE: 209 mg/dL — AB (ref 70–140)
Potassium: 4.1 mEq/L (ref 3.5–5.1)
SODIUM: 144 meq/L (ref 136–145)
TOTAL PROTEIN: 7.4 g/dL (ref 6.4–8.3)
Total Bilirubin: 0.39 mg/dL (ref 0.20–1.20)

## 2015-01-08 MED ORDER — LEVOFLOXACIN 250 MG PO TABS: 250.0000 mg | ORAL_TABLET | ORAL | Status: DC

## 2015-01-08 NOTE — Telephone Encounter (Signed)
Spoke with Daughter [name] regarding the results of the CXR and plan as noted below by Dr. Marko Plume  Daughter verbalized understanding and will relay to mother as there was no answer at the patient's home.  Daughter and mother live in the same house.

## 2015-01-08 NOTE — Telephone Encounter (Signed)
Gave avs & calendar for September °

## 2015-01-08 NOTE — Telephone Encounter (Signed)
-----   Message from Gordy Levan, MD sent at 01/08/2015  3:30 PM EDT ----- Please let patient know that the CXR today does look more like pneumonia - there is just a little fluid at base of lung, but most of what they see suggests pneumonia in bottom of lung. We will put her on antibiotics for ~ 10 days and will recheck CXR when I see her in early Sept. If breathing is worse or no better, or fever, or productive cough after antibiotics complete, she should let this office &/or PCP know.  Levaquin dose reduced due to creatinine clearance ~ 19: 250 mg every 48 hrs   #5  She should also use mucinex ~ 1200 mg daily    Please add CXR before my apt ~ 9-6.  thanks

## 2015-01-08 NOTE — Progress Notes (Signed)
OFFICE PROGRESS NOTE   January 08, 2015   Physicians:Paola Gehrig/ E.Verdon Cummins, Amy E, MD, J.McComb, Kathlyn Sacramento, Erling Cruz, Jeneen Rinks Kinard  INTERVAL HISTORY:  Patient is seen, together with daughter, in continuing attention to carcinosarcoma of uterus for which she is on observation since R0 resection at [facility] 09-30-14, particularly as she has significant comorbid conditions and does not want attempts at adjuvant chemotherapy or radiation. She saw Dr Denman George 10-31-14 and had consultation with Dr Sondra Come on 11-27-14. She decided against attempting pelvic radiation, this considered in attempt to prevent pelvic recurrence of the malignancy.  She had CT AP without IV contrast on 01-05-15, with new pleural effusions, questioned lesion in liver and small perihepatic fluid, nothing of concern in pelvis. She is to see Dr Florene Glen on 01-16-15.    Patient felt well until SOB for last 2-3 days, mostly with exertion such as trying to clean bedroom. She has no cough or sputum, no fever, no chest pain. She was treated with 5 days zithromax early July by PCP for respiratory infection. Appetite is at baseline, bowels moving regularly, no abdominal or pelvic discomfort, no bleeding. She is not SOB at rest.    ONCOLOGIC HISTORY Patient has been followed closely for several years by PCP Dr Eliezer Lofts. She presented with ~ 6 weeks of vaginal spotting, with [place] on 08-22-14 showing endometrium 28 mm thick, with uterus 10.5 x 5.5 x 6.3 cm with 2 fibroids, and thick walled cystic lesion in left ovary measuring 13 mm.She had a repeat ultrasound done on April 23 which revealed the endometrial stripe thickening to be 18m. There was a hypoechoic mass 7 x 3 cm seen within the endometrium into the cervix. An endometrial biopsy by Dr JArvella Nighrevealed a high-grade endometrial carcinoma with papillary architecture and serous features. She was [facility] showing small  retroperitoneal/ pelvic lymph nodes, enlarged uterus with heterogeneous thickening of endometrium, bilateral ovaries not remarkable, no ascites, also gallstone, degenerative arthritis and probably chronic compression fracture L1. Creatinine on 09-09-14 was 2.88, with recent priors ~ 2.5. She had cardiology clearance prior to surgery. [facility] which was [facility]AH/ BSO, pelvic lymph node evaluation and partial omentectomy for R0 resection by Dr GAlycia Rossetti [phone] found carcinosarcoma of uterus, with homologous sarcomatous component and mixed serous/ clear cell FIGO grade 3 carcinoma. Primary tumor was 6.6 cm with 22 / 23 mm invasion, right tube, right ovary and left tube involved, omentum diffusely involved, + LVSI, 1 left pelvic node and 1 right pelvic node involved (left ovary not involved). Post operative course was not remarkable and she was discharged home on POD #3 (10-03-14). Labs from 10-03-14 had WBC 15, Hgb 10.4, plt 328, BUN 47 and creatinine 2.39. [facility], tho information in that summary did not include her renal function, with recommendation for adjuvant carboplatin and taxol, with plan to reimage after completion of chemotherapy, then possible pelvic radiation. At medical oncology consultation on 10-19-14 she had creatinine 4.2.      Remainder of 10 point Review of Systems negative.  Objective:  Vital signs in last 24 hours:  BP 176/77 mmHg  Pulse 97  Temp(Src) 98.2 F (36.8 C) (Oral)  Resp 17  Ht 5' 8.5" (1.74 m)  Wt 182 lb 1.6 oz (82.6 kg)  BMI 27.28 kg/m2  SpO2 98% Weight down 1 lb Alert,  oriented and appropriate. Ambulatory without assistance, able to get on and off exam table without assistance   HEENT:PERRL, sclerae not icteric. Oral mucosa moist without lesions, posterior pharynx clear.  Neck supple. No JVD.  Lymphatics:no cervical,supraclavicular adenopathy Resp:  Respirations not labored with activity in exam room. Dullness to percussion and absent BS lower 1/3 right lung posteriorly, otherwise clear to auscultation upper right lung and left to base,  and normal percussion otherwise. No use of accessory muscles.  Cardio: regular rate and rhythm. No gallop. GI: soft, nontender, not distended, no mass or organomegaly. Normally active bowel sounds. Surgical incision not remarkable. Musculoskeletal/ Extremities: trace - 1+ bilateral  pedal edema without cords, tenderness Neuro: speech fluent and appropriate, no focal deficits. PSYCH appropriate mood and affect Skin without rash, ecchymosis, petechiae   Lab Results:  Results for orders placed or performed in visit on 01/08/15  CBC with Differential  Result Value Ref Range   WBC 13.4 (H) 3.9 - 10.3 10e3/uL   NEUT# 10.9 (H) 1.5 - 6.5 10e3/uL   HGB 10.2 (L) 11.6 - 15.9 g/dL   HCT 32.5 (L) 34.8 - 46.6 %   Platelets 503 (H) 145 - 400 10e3/uL   MCV 73.4 (L) 79.5 - 101.0 fL   MCH 23.1 (L) 25.1 - 34.0 pg   MCHC 31.5 31.5 - 36.0 g/dL   RBC 4.43 3.70 - 5.45 10e6/uL   RDW 18.5 (H) 11.2 - 14.5 %   lymph# 1.6 0.9 - 3.3 10e3/uL   MONO# 0.7 0.1 - 0.9 10e3/uL   Eosinophils Absolute 0.2 0.0 - 0.5 10e3/uL   Basophils Absolute 0.0 0.0 - 0.1 10e3/uL   NEUT% 81.5 (H) 38.4 - 76.8 %   LYMPH% 11.9 (L) 14.0 - 49.7 %   MONO% 4.9 0.0 - 14.0 %   EOS% 1.5 0.0 - 7.0 %   BASO% 0.2 0.0 - 2.0 %         01-08-15 11-13-14    Sodium 136 - 145 mEq/L 144 143    Potassium 3.5 - 5.1 mEq/L 4.1 3.9    Chloride 98 - 109 mEq/L 109 109    CO2 22 - 29 mEq/L 23 26    Glucose 70 - 140 mg/dl 209 (H) 161 (H)    BUN 7.0 - 26.0 mg/dL 41.6 (H) 29.7 (H)    Creatinine 0.6 - 1.1 mg/dL 2.6 (H) 2.6 (H)    Total Bilirubin 0.20 - 1.20 mg/dL 0.39     Alkaline Phosphatase 40 - 150 U/L 89     AST 5 - 34 U/L 8     ALT 0 - 55 U/L 12     Total Protein 6.4 - 8.3 g/dL 7.4     Albumin 3.5 - 5.0 g/dL 3.3 (L)     Calcium 8.4 - 10.4 mg/dL  9.5 8.9    Anion Gap 3 - 11 mEq/L 11 7    EGFR >90 ml/min/1.73 m2 19 (L)             Studies/Results:   EXAM: CT ABDOMEN AND PELVIS WITHOUT CONTRAST  01-05-15  COMPARISON: 09/23/2014.  FINDINGS: Lower chest: New bilateral pleural effusions which are small to moderate in size with the RIGHT effusion greater than the LEFT.  Hepatobiliary: Nonenhanced images demonstrated new relative low-density lesion in the LEFT hepatic lobe measuring 2.3 cm (image 17, series 2). There is a small amount of fluid surrounding the liver margin which is also new from prior.  Pancreas: Pancreas is normal. No ductal  dilatation. No pancreatic inflammation.  Spleen: Normal spleen  Adrenals/urinary tract: Adrenal glands are nodular abut unchanged from comparison exam. Largest nodules in the LEFT adrenal gland on image 25, series 2 measuring 15 mm compared to 15 mm.  The kidneys are atrophic with multiple mixed density cysts consistent with chronic cystic kidney disease. No hydronephrosis evident. Bladder is normal.  Stomach/Bowel: Stomach, small bowel, appendix, and cecum are normal. The colon and rectosigmoid colon are normal.  Vascular/Lymphatic: Heavy calcification of abdominal aorta. No retroperitoneal periportal lymphadenopathy. No pelvic lymphadenopathy.  Reproductive: Post hysterectomy anatomy. No evidence of local carcinoma recurrence.  Musculoskeletal: No aggressive osseous lesion.  Other: There is some thickening along the ventral peritoneal surface which is ill-defined image 37, series 2 and image 49, series 2. No discrete nodularity.  IMPRESSION: 1. New lesion LEFT hepatic lobe is concerning for hepatic metastasis. Consider MRI or PET-CT scan for further evaluation if patient cannot receive IV contrast. 2. New bilateral pleural effusions and small amount of intraperitoneal free fluid. 3. Thickening along the ventral peritoneal surface is new and  likely relates to abdominal surgery. No discrete nodularity. 4. Stable nodular thickening of the adrenal glands.  PACS images reviewed with patient and daughter now.   .    Medications: I have reviewed the patient's current medications.   DISCUSSION: new pleural effusions and other findings on CT discussed, concerning for progression of the uterine carcinosarcoma, tho possibly other etiology. If significant pleural effusion by CXR, thoracentesis may help SOB and could be sent for cytology.  Patient and daughter are still comfortable with their decision not to try pelvic radiation, and understand that areas of concern on CT are not in pelvis. We will let them know result of CXR, with recommendations based on that information. Follow up at this office planned.  From brief, general comments today, she and daughter may not want any aggressive treatment even if progressive malignancy.  I have encouraged her to keep appointment with Dr Florene Glen, as he may have suggestions to optimize renal function even tho she has decided against hemodialysis.    CXR obtained after visit: CHEST 2 VIEW  01-08-15  COMPARISON: None.  FINDINGS: Mediastinum and hilar structures are normal. Right lower lobe infiltrate consistent with pneumonia. Small right pleural effusion. Heart size normal. No pneumothorax. No acute bony abnormality .  IMPRESSION: Right lower lobe infiltrate consistent with pneumonia. Small right pleural effusion  Will treat with levoquin 250 mg every other day x 10 days, based on EGFR 19. Will have her add mucinex daily, and she should call if fever or worsening respiratory symptoms. May need CT chest if no improvement.  Note long past tobacco.  Assessment/Plan: 1.IVB carcinosarcoma of uterus: post R0 debulking 09-30-14, with path finding of more extensive disease than was visually apparent. Recommendation was adjuvant chemotherapy with carboplatin and taxol, however renal function  too poor even at baseline for platinum drugs (or ifosfamide), and patient preferred observation to any attempt at systemic treatment in adjuvant fashion. She likewise declined adjuvant RT. CT suggests early progression and she does not clearly have infectious illness tho CXR suggests pneumonia. Will treat with antibiotics dosed for CKD, then see again.  2.Chronic kidney disease: EGFR 19-20. To see Dr Florene Glen again. 3.diabetes x 25 years on insulin, managed by Dr Diona Browner 4.long past tobacco.  5.degenerative arthritis 6.HTN, but no indication for ETT prior to surgery per cardiology 7.advance directives not completed tho she has information    All questoins answered and patient and  daughter are in agreement with recommendations and plans. Cc Drs Ileene Patrick. TIme spent 30 min including >50% counseling and coordination of care   LIVESAY,LENNIS P, MD   01/08/2015, 10:14 AM

## 2015-01-09 DIAGNOSIS — R918 Other nonspecific abnormal finding of lung field: Secondary | ICD-10-CM | POA: Insufficient documentation

## 2015-01-09 DIAGNOSIS — J90 Pleural effusion, not elsewhere classified: Secondary | ICD-10-CM | POA: Insufficient documentation

## 2015-01-22 ENCOUNTER — Other Ambulatory Visit: Payer: Self-pay | Admitting: *Deleted

## 2015-01-22 ENCOUNTER — Telehealth: Payer: Self-pay | Admitting: Nurse Practitioner

## 2015-01-22 ENCOUNTER — Other Ambulatory Visit: Payer: Self-pay | Admitting: Oncology

## 2015-01-22 ENCOUNTER — Telehealth: Payer: Self-pay | Admitting: *Deleted

## 2015-01-22 DIAGNOSIS — J90 Pleural effusion, not elsewhere classified: Secondary | ICD-10-CM

## 2015-01-22 DIAGNOSIS — R918 Other nonspecific abnormal finding of lung field: Secondary | ICD-10-CM

## 2015-01-22 DIAGNOSIS — C541 Malignant neoplasm of endometrium: Secondary | ICD-10-CM

## 2015-01-22 NOTE — Telephone Encounter (Signed)
PT. HAS FINISHED THE LEVAQUIN. SHE IS STILL FEELING BAD. PT. HAS A "HARD TIME BREATHING " WITH EXERTION. SHE DOES NOT HAVE A FEVER. PT IS WHEEZING AND OCCASIONALLY HAS A NON PRODUCTIVE COUGH. HER FLUID INTAKE IN THE PAST 24 HOURS WAS 40 OUNCES. ENCOURAGED PT. TO FORCE FLUIDS.

## 2015-01-22 NOTE — Telephone Encounter (Signed)
Called patient and she is aware of her appointments 9/2

## 2015-01-22 NOTE — Telephone Encounter (Signed)
Re increased SOB and cough, finished antibiotic.  CXR + see Selena Lesser today or tomorrow if possible. Or see PCP today or tomorrow   Needs to keep my apt on 9-6. CXR with my apt if not done otherwise between now and then.  Probably need to speak with family member as well as with pt re apts Thank you

## 2015-01-22 NOTE — Telephone Encounter (Signed)
PT. WILL SEE CYNDEE BACON,NP ON 01/23/15 AT 1:00PM. SHE WILL HAVE A CHEST XRAY AT 12:00 NOON AND LAB AT 12:30PM BEFORE VISIT WITH CYNDEE BACON,NP. NOTIFIED PT. OF HER APPOINTMENTS AND ASK THAT PT.'S DAUGHTER CALL IF THERE ARE ANY QUESTIONS. ALSO INSTRUCTED PT. TO GO TO THE EMERGENCY DEPARTMENT FOR AN EVALUATION IF HER SYMPTOMS WORSEN. SHE VOICES UNDERSTANDING. ORDERS PLACED AND POF TO SCHEDULING.

## 2015-01-23 ENCOUNTER — Other Ambulatory Visit: Payer: Self-pay | Admitting: Oncology

## 2015-01-23 ENCOUNTER — Ambulatory Visit (HOSPITAL_COMMUNITY)
Admission: RE | Admit: 2015-01-23 | Discharge: 2015-01-23 | Disposition: A | Discharge status: Home / Self Care | Payer: Medicare Other | Source: Ambulatory Visit | Attending: Nurse Practitioner | Admitting: Nurse Practitioner

## 2015-01-23 ENCOUNTER — Other Ambulatory Visit (HOSPITAL_BASED_OUTPATIENT_CLINIC_OR_DEPARTMENT_OTHER): Payer: Medicare Other

## 2015-01-23 ENCOUNTER — Ambulatory Visit (HOSPITAL_BASED_OUTPATIENT_CLINIC_OR_DEPARTMENT_OTHER): Payer: Medicare Other | Admitting: Nurse Practitioner

## 2015-01-23 VITALS — BP 143/59 | HR 86 | Temp 97.6°F | Resp 18 | Ht 68.5 in

## 2015-01-23 DIAGNOSIS — C541 Malignant neoplasm of endometrium: Secondary | ICD-10-CM

## 2015-01-23 DIAGNOSIS — I5031 Acute diastolic (congestive) heart failure: Secondary | ICD-10-CM | POA: Diagnosis present

## 2015-01-23 DIAGNOSIS — C787 Secondary malignant neoplasm of liver and intrahepatic bile duct: Secondary | ICD-10-CM | POA: Diagnosis present

## 2015-01-23 DIAGNOSIS — D509 Iron deficiency anemia, unspecified: Secondary | ICD-10-CM | POA: Diagnosis present

## 2015-01-23 DIAGNOSIS — J90 Pleural effusion, not elsewhere classified: Secondary | ICD-10-CM | POA: Diagnosis not present

## 2015-01-23 DIAGNOSIS — I10 Essential (primary) hypertension: Secondary | ICD-10-CM

## 2015-01-23 DIAGNOSIS — C55 Malignant neoplasm of uterus, part unspecified: Secondary | ICD-10-CM

## 2015-01-23 DIAGNOSIS — R0602 Shortness of breath: Secondary | ICD-10-CM | POA: Diagnosis not present

## 2015-01-23 DIAGNOSIS — Z515 Encounter for palliative care: Secondary | ICD-10-CM

## 2015-01-23 DIAGNOSIS — I129 Hypertensive chronic kidney disease with stage 1 through stage 4 chronic kidney disease, or unspecified chronic kidney disease: Secondary | ICD-10-CM | POA: Diagnosis present

## 2015-01-23 DIAGNOSIS — E1122 Type 2 diabetes mellitus with diabetic chronic kidney disease: Secondary | ICD-10-CM | POA: Diagnosis present

## 2015-01-23 DIAGNOSIS — J9601 Acute respiratory failure with hypoxia: Secondary | ICD-10-CM | POA: Diagnosis present

## 2015-01-23 DIAGNOSIS — D638 Anemia in other chronic diseases classified elsewhere: Secondary | ICD-10-CM | POA: Diagnosis present

## 2015-01-23 DIAGNOSIS — R05 Cough: Secondary | ICD-10-CM

## 2015-01-23 DIAGNOSIS — E785 Hyperlipidemia, unspecified: Secondary | ICD-10-CM | POA: Diagnosis present

## 2015-01-23 DIAGNOSIS — N189 Chronic kidney disease, unspecified: Secondary | ICD-10-CM | POA: Diagnosis not present

## 2015-01-23 DIAGNOSIS — Z87891 Personal history of nicotine dependence: Secondary | ICD-10-CM

## 2015-01-23 DIAGNOSIS — N179 Acute kidney failure, unspecified: Secondary | ICD-10-CM | POA: Diagnosis present

## 2015-01-23 DIAGNOSIS — R062 Wheezing: Secondary | ICD-10-CM | POA: Insufficient documentation

## 2015-01-23 DIAGNOSIS — R918 Other nonspecific abnormal finding of lung field: Secondary | ICD-10-CM

## 2015-01-23 DIAGNOSIS — J189 Pneumonia, unspecified organism: Secondary | ICD-10-CM | POA: Diagnosis present

## 2015-01-23 DIAGNOSIS — Z794 Long term (current) use of insulin: Secondary | ICD-10-CM

## 2015-01-23 DIAGNOSIS — E119 Type 2 diabetes mellitus without complications: Secondary | ICD-10-CM

## 2015-01-23 DIAGNOSIS — Z79899 Other long term (current) drug therapy: Secondary | ICD-10-CM

## 2015-01-23 DIAGNOSIS — N184 Chronic kidney disease, stage 4 (severe): Secondary | ICD-10-CM | POA: Diagnosis present

## 2015-01-23 DIAGNOSIS — Y95 Nosocomial condition: Secondary | ICD-10-CM | POA: Diagnosis present

## 2015-01-23 DIAGNOSIS — J910 Malignant pleural effusion: Secondary | ICD-10-CM | POA: Diagnosis present

## 2015-01-23 LAB — COMPREHENSIVE METABOLIC PANEL (CC13)
ALK PHOS: 90 U/L (ref 40–150)
ALT: 8 U/L (ref 0–55)
Albumin: 3.4 g/dL — ABNORMAL LOW (ref 3.5–5.0)
Anion Gap: 10 mEq/L (ref 3–11)
BUN: 66.3 mg/dL — AB (ref 7.0–26.0)
CHLORIDE: 113 meq/L — AB (ref 98–109)
CO2: 23 meq/L (ref 22–29)
Calcium: 9.1 mg/dL (ref 8.4–10.4)
Creatinine: 3.3 mg/dL (ref 0.6–1.1)
EGFR: 15 mL/min/{1.73_m2} — AB (ref >90)
GLUCOSE: 131 mg/dL (ref 70–140)
POTASSIUM: 4.7 meq/L (ref 3.5–5.1)
SODIUM: 145 meq/L (ref 136–145)
Total Bilirubin: 0.33 mg/dL (ref 0.20–1.20)
Total Protein: 7.2 g/dL (ref 6.4–8.3)

## 2015-01-23 LAB — CBC WITH DIFFERENTIAL/PLATELET
BASO%: 0.1 % (ref 0.0–2.0)
BASOS ABS: 0 10*3/uL (ref 0.0–0.1)
EOS ABS: 0.2 10*3/uL (ref 0.0–0.5)
EOS%: 1.4 % (ref 0.0–7.0)
HCT: 31.3 % — ABNORMAL LOW (ref 34.8–46.6)
HGB: 9.6 g/dL — ABNORMAL LOW (ref 11.6–15.9)
LYMPH%: 9.1 % — AB (ref 14.0–49.7)
MCH: 22.9 pg — AB (ref 25.1–34.0)
MCHC: 30.7 g/dL — AB (ref 31.5–36.0)
MCV: 74.5 fL — AB (ref 79.5–101.0)
MONO#: 0.6 10*3/uL (ref 0.1–0.9)
MONO%: 4.6 % (ref 0.0–14.0)
NEUT#: 10.9 10*3/uL — ABNORMAL HIGH (ref 1.5–6.5)
NEUT%: 84.8 % — ABNORMAL HIGH (ref 38.4–76.8)
Platelets: 456 10*3/uL — ABNORMAL HIGH (ref 145–400)
RBC: 4.2 10*6/uL (ref 3.70–5.45)
RDW: 18.7 % — AB (ref 11.2–14.5)
WBC: 12.9 10*3/uL — ABNORMAL HIGH (ref 3.9–10.3)
lymph#: 1.2 10*3/uL (ref 0.9–3.3)

## 2015-01-23 MED ORDER — ALBUTEROL SULFATE HFA 108 (90 BASE) MCG/ACT IN AERS: 1.0000 | INHALATION_SPRAY | Freq: Four times a day (QID) | RESPIRATORY_TRACT | Status: AC | PRN

## 2015-01-25 ENCOUNTER — Inpatient Hospital Stay (HOSPITAL_COMMUNITY): Payer: Medicare Other

## 2015-01-25 ENCOUNTER — Inpatient Hospital Stay (HOSPITAL_BASED_OUTPATIENT_CLINIC_OR_DEPARTMENT_OTHER): Payer: Medicare Other

## 2015-01-25 ENCOUNTER — Encounter (HOSPITAL_COMMUNITY): Payer: Self-pay | Admitting: Emergency Medicine

## 2015-01-25 ENCOUNTER — Inpatient Hospital Stay (HOSPITAL_COMMUNITY)
Admission: EM | Admit: 2015-01-25 | Discharge: 2015-01-29 | DRG: 754 | Disposition: A | Discharge status: Hospice | Payer: Medicare Other | Attending: Internal Medicine | Admitting: Internal Medicine

## 2015-01-25 ENCOUNTER — Other Ambulatory Visit: Payer: Self-pay

## 2015-01-25 ENCOUNTER — Encounter: Payer: Self-pay | Admitting: Nurse Practitioner

## 2015-01-25 ENCOUNTER — Emergency Department (HOSPITAL_COMMUNITY): Payer: Medicare Other

## 2015-01-25 ENCOUNTER — Other Ambulatory Visit: Payer: Self-pay | Admitting: Oncology

## 2015-01-25 DIAGNOSIS — J189 Pneumonia, unspecified organism: Secondary | ICD-10-CM | POA: Diagnosis present

## 2015-01-25 DIAGNOSIS — R0600 Dyspnea, unspecified: Secondary | ICD-10-CM

## 2015-01-25 DIAGNOSIS — C55 Malignant neoplasm of uterus, part unspecified: Secondary | ICD-10-CM | POA: Diagnosis not present

## 2015-01-25 DIAGNOSIS — Z9889 Other specified postprocedural states: Secondary | ICD-10-CM

## 2015-01-25 DIAGNOSIS — I10 Essential (primary) hypertension: Secondary | ICD-10-CM | POA: Diagnosis not present

## 2015-01-25 DIAGNOSIS — Z515 Encounter for palliative care: Secondary | ICD-10-CM | POA: Diagnosis not present

## 2015-01-25 DIAGNOSIS — I5031 Acute diastolic (congestive) heart failure: Secondary | ICD-10-CM | POA: Diagnosis present

## 2015-01-25 DIAGNOSIS — E1122 Type 2 diabetes mellitus with diabetic chronic kidney disease: Secondary | ICD-10-CM

## 2015-01-25 DIAGNOSIS — J9601 Acute respiratory failure with hypoxia: Secondary | ICD-10-CM | POA: Diagnosis present

## 2015-01-25 DIAGNOSIS — N189 Chronic kidney disease, unspecified: Secondary | ICD-10-CM | POA: Diagnosis not present

## 2015-01-25 DIAGNOSIS — Z794 Long term (current) use of insulin: Secondary | ICD-10-CM | POA: Diagnosis not present

## 2015-01-25 DIAGNOSIS — Y95 Nosocomial condition: Secondary | ICD-10-CM | POA: Diagnosis present

## 2015-01-25 DIAGNOSIS — D509 Iron deficiency anemia, unspecified: Secondary | ICD-10-CM | POA: Diagnosis present

## 2015-01-25 DIAGNOSIS — E785 Hyperlipidemia, unspecified: Secondary | ICD-10-CM | POA: Diagnosis present

## 2015-01-25 DIAGNOSIS — N184 Chronic kidney disease, stage 4 (severe): Secondary | ICD-10-CM | POA: Diagnosis present

## 2015-01-25 DIAGNOSIS — R609 Edema, unspecified: Secondary | ICD-10-CM

## 2015-01-25 DIAGNOSIS — C799 Secondary malignant neoplasm of unspecified site: Secondary | ICD-10-CM

## 2015-01-25 DIAGNOSIS — J9600 Acute respiratory failure, unspecified whether with hypoxia or hypercapnia: Secondary | ICD-10-CM | POA: Diagnosis not present

## 2015-01-25 DIAGNOSIS — C541 Malignant neoplasm of endometrium: Secondary | ICD-10-CM | POA: Diagnosis present

## 2015-01-25 DIAGNOSIS — Z79899 Other long term (current) drug therapy: Secondary | ICD-10-CM | POA: Diagnosis not present

## 2015-01-25 DIAGNOSIS — R0602 Shortness of breath: Secondary | ICD-10-CM

## 2015-01-25 DIAGNOSIS — N289 Disorder of kidney and ureter, unspecified: Secondary | ICD-10-CM | POA: Diagnosis not present

## 2015-01-25 DIAGNOSIS — J90 Pleural effusion, not elsewhere classified: Secondary | ICD-10-CM | POA: Diagnosis present

## 2015-01-25 DIAGNOSIS — D631 Anemia in chronic kidney disease: Secondary | ICD-10-CM

## 2015-01-25 DIAGNOSIS — N179 Acute kidney failure, unspecified: Secondary | ICD-10-CM | POA: Diagnosis present

## 2015-01-25 DIAGNOSIS — C787 Secondary malignant neoplasm of liver and intrahepatic bile duct: Secondary | ICD-10-CM | POA: Diagnosis present

## 2015-01-25 DIAGNOSIS — D638 Anemia in other chronic diseases classified elsewhere: Secondary | ICD-10-CM | POA: Diagnosis present

## 2015-01-25 DIAGNOSIS — I129 Hypertensive chronic kidney disease with stage 1 through stage 4 chronic kidney disease, or unspecified chronic kidney disease: Secondary | ICD-10-CM | POA: Diagnosis present

## 2015-01-25 DIAGNOSIS — Z87891 Personal history of nicotine dependence: Secondary | ICD-10-CM | POA: Diagnosis not present

## 2015-01-25 DIAGNOSIS — J910 Malignant pleural effusion: Secondary | ICD-10-CM | POA: Diagnosis present

## 2015-01-25 HISTORY — DX: Pneumonia, unspecified organism: J18.9

## 2015-01-25 LAB — GLUCOSE, SEROUS FLUID: Glucose, Fluid: 144 mg/dL

## 2015-01-25 LAB — GLUCOSE, CAPILLARY: Glucose-Capillary: 276 mg/dL — ABNORMAL HIGH (ref 65–99)

## 2015-01-25 LAB — CBC WITH DIFFERENTIAL/PLATELET
Basophils Absolute: 0 10*3/uL (ref 0.0–0.1)
Basophils Relative: 0 % (ref 0–1)
Eosinophils Absolute: 0 10*3/uL (ref 0.0–0.7)
Eosinophils Relative: 0 % (ref 0–5)
HCT: 30.8 % — ABNORMAL LOW (ref 36.0–46.0)
Hemoglobin: 9.5 g/dL — ABNORMAL LOW (ref 12.0–15.0)
Lymphocytes Relative: 5 % — ABNORMAL LOW (ref 12–46)
Lymphs Abs: 0.7 10*3/uL (ref 0.7–4.0)
MCH: 23.5 pg — ABNORMAL LOW (ref 26.0–34.0)
MCHC: 30.8 g/dL (ref 30.0–36.0)
MCV: 76.2 fL — ABNORMAL LOW (ref 78.0–100.0)
Monocytes Absolute: 0.1 10*3/uL (ref 0.1–1.0)
Monocytes Relative: 0 % — ABNORMAL LOW (ref 3–12)
Neutro Abs: 15.3 10*3/uL — ABNORMAL HIGH (ref 1.7–7.7)
Neutrophils Relative %: 95 % — ABNORMAL HIGH (ref 43–77)
Platelets: 443 10*3/uL — ABNORMAL HIGH (ref 150–400)
RBC: 4.04 MIL/uL (ref 3.87–5.11)
RDW: 18.3 % — ABNORMAL HIGH (ref 11.5–15.5)
WBC: 16.1 10*3/uL — ABNORMAL HIGH (ref 4.0–10.5)

## 2015-01-25 LAB — PROTIME-INR
INR: 1.09 (ref 0.00–1.49)
Prothrombin Time: 14.3 seconds (ref 11.6–15.2)

## 2015-01-25 LAB — TSH: TSH: 0.24 u[IU]/mL — ABNORMAL LOW (ref 0.350–4.500)

## 2015-01-25 LAB — COMPREHENSIVE METABOLIC PANEL
ALT: 9 U/L — ABNORMAL LOW (ref 14–54)
ALT: 11 U/L — ABNORMAL LOW (ref 14–54)
AST: 8 U/L — AB (ref 15–41)
AST: 8 U/L — ABNORMAL LOW (ref 15–41)
Albumin: 3.7 g/dL (ref 3.5–5.0)
Albumin: 3.6 g/dL (ref 3.5–5.0)
Alkaline Phosphatase: 81 U/L (ref 38–126)
Alkaline Phosphatase: 80 U/L (ref 38–126)
Anion gap: 8 (ref 5–15)
Anion gap: 10 (ref 5–15)
BILIRUBIN TOTAL: 0.5 mg/dL (ref 0.3–1.2)
BUN: 73 mg/dL — AB (ref 6–20)
BUN: 72 mg/dL — ABNORMAL HIGH (ref 6–20)
CHLORIDE: 113 mmol/L — AB (ref 101–111)
CO2: 24 mmol/L (ref 22–32)
CO2: 20 mmol/L — ABNORMAL LOW (ref 22–32)
Calcium: 8.9 mg/dL (ref 8.9–10.3)
Calcium: 8.7 mg/dL — ABNORMAL LOW (ref 8.9–10.3)
Chloride: 111 mmol/L (ref 101–111)
Creatinine, Ser: 3.57 mg/dL — ABNORMAL HIGH (ref 0.44–1.00)
Creatinine, Ser: 3.5 mg/dL — ABNORMAL HIGH (ref 0.44–1.00)
GFR calc Af Amer: 13 mL/min — ABNORMAL LOW (ref >60)
GFR calc non Af Amer: 11 mL/min — ABNORMAL LOW (ref >60)
GFR, EST AFRICAN AMERICAN: 13 mL/min — AB (ref >60)
GFR, EST NON AFRICAN AMERICAN: 11 mL/min — AB (ref >60)
Glucose, Bld: 187 mg/dL — ABNORMAL HIGH (ref 65–99)
Glucose, Bld: 211 mg/dL — ABNORMAL HIGH (ref 65–99)
POTASSIUM: 4.8 mmol/L (ref 3.5–5.1)
Potassium: 5 mmol/L (ref 3.5–5.1)
Sodium: 145 mmol/L (ref 135–145)
Sodium: 141 mmol/L (ref 135–145)
TOTAL PROTEIN: 7.5 g/dL (ref 6.5–8.1)
Total Bilirubin: 0.2 mg/dL — ABNORMAL LOW (ref 0.3–1.2)
Total Protein: 7.4 g/dL (ref 6.5–8.1)

## 2015-01-25 LAB — BODY FLUID CELL COUNT WITH DIFFERENTIAL
EOS FL: 0 %
LYMPHS FL: 55 %
Monocyte-Macrophage-Serous Fluid: 14 % — ABNORMAL LOW (ref 50–90)
NEUTROPHIL FLUID: 31 % — AB (ref 0–25)
Total Nucleated Cell Count, Fluid: 1159 cu mm — ABNORMAL HIGH (ref 0–1000)

## 2015-01-25 LAB — BLOOD GAS, VENOUS
ACID-BASE DEFICIT: 5.1 mmol/L — AB (ref 0.0–2.0)
Bicarbonate: 21.9 mEq/L (ref 20.0–24.0)
O2 CONTENT: 2 L/min
O2 SAT: 61.5 %
PATIENT TEMPERATURE: 97.5
PCO2 VEN: 51.7 mmHg — AB (ref 45.0–50.0)
PO2 VEN: 36.4 mmHg (ref 30.0–45.0)
TCO2: 21.2 mmol/L (ref 0–100)
pH, Ven: 7.246 — ABNORMAL LOW (ref 7.250–7.300)

## 2015-01-25 LAB — CBG MONITORING, ED
GLUCOSE-CAPILLARY: 186 mg/dL — AB (ref 65–99)
Glucose-Capillary: 219 mg/dL — ABNORMAL HIGH (ref 65–99)

## 2015-01-25 LAB — CBC
HCT: 32.2 % — ABNORMAL LOW (ref 36.0–46.0)
Hemoglobin: 9.9 g/dL — ABNORMAL LOW (ref 12.0–15.0)
MCH: 23.3 pg — AB (ref 26.0–34.0)
MCHC: 30.7 g/dL (ref 30.0–36.0)
MCV: 75.9 fL — ABNORMAL LOW (ref 78.0–100.0)
PLATELETS: 496 10*3/uL — AB (ref 150–400)
RBC: 4.24 MIL/uL (ref 3.87–5.11)
RDW: 18.2 % — AB (ref 11.5–15.5)
WBC: 15.5 10*3/uL — ABNORMAL HIGH (ref 4.0–10.5)

## 2015-01-25 LAB — I-STAT TROPONIN, ED: TROPONIN I, POC: 0.01 ng/mL (ref 0.00–0.08)

## 2015-01-25 LAB — LACTIC ACID, PLASMA
LACTIC ACID, VENOUS: 1.9 mmol/L (ref 0.5–2.0)
Lactic Acid, Venous: 1.5 mmol/L (ref 0.5–2.0)

## 2015-01-25 LAB — HIV ANTIBODY (ROUTINE TESTING W REFLEX): HIV Screen 4th Generation wRfx: NONREACTIVE

## 2015-01-25 LAB — MRSA PCR SCREENING: MRSA BY PCR: NEGATIVE

## 2015-01-25 LAB — APTT: APTT: 25 s (ref 24–37)

## 2015-01-25 LAB — PROTEIN, BODY FLUID: Total protein, fluid: 4.3 g/dL

## 2015-01-25 LAB — LACTATE DEHYDROGENASE, PLEURAL OR PERITONEAL FLUID: LD, Fluid: 765 U/L — ABNORMAL HIGH (ref 3–23)

## 2015-01-25 LAB — PROCALCITONIN: Procalcitonin: <0.1 ng/mL

## 2015-01-25 LAB — BRAIN NATRIURETIC PEPTIDE: B Natriuretic Peptide: 268 pg/mL — ABNORMAL HIGH (ref 0.0–100.0)

## 2015-01-25 LAB — STREP PNEUMONIAE URINARY ANTIGEN: Strep Pneumo Urinary Antigen: NEGATIVE

## 2015-01-25 MED ORDER — PIPERACILLIN-TAZOBACTAM 3.375 G IVPB 30 MIN: 3.3750 g | Freq: Three times a day (TID) | INTRAVENOUS | Status: DC

## 2015-01-25 MED ORDER — AMLODIPINE BESYLATE 10 MG PO TABS
10.0000 mg | ORAL_TABLET | Freq: Every day | ORAL | Status: DC
  Administered 2015-01-26 – 2015-01-29 (×4): 10 mg via ORAL
  Filled 2015-01-25 (×5): qty 1

## 2015-01-25 MED ORDER — IPRATROPIUM-ALBUTEROL 0.5-2.5 (3) MG/3ML IN SOLN
3.0000 mL | Freq: Once | RESPIRATORY_TRACT | Status: AC
  Administered 2015-01-25: 3 mL via RESPIRATORY_TRACT
  Filled 2015-01-25: qty 3

## 2015-01-25 MED ORDER — TECHNETIUM TO 99M ALBUMIN AGGREGATED
6.2000 | Freq: Once | INTRAVENOUS | Status: AC | PRN
  Administered 2015-01-25: 6.2 via INTRAVENOUS

## 2015-01-25 MED ORDER — VANCOMYCIN HCL IN DEXTROSE 1-5 GM/200ML-% IV SOLN
1000.0000 mg | Freq: Once | INTRAVENOUS | Status: AC
  Administered 2015-01-25: 1000 mg via INTRAVENOUS
  Filled 2015-01-25: qty 200

## 2015-01-25 MED ORDER — ATORVASTATIN CALCIUM 80 MG PO TABS
80.0000 mg | ORAL_TABLET | Freq: Every day | ORAL | Status: DC
  Filled 2015-01-25: qty 1

## 2015-01-25 MED ORDER — ALBUTEROL SULFATE (2.5 MG/3ML) 0.083% IN NEBU
2.5000 mg | INHALATION_SOLUTION | RESPIRATORY_TRACT | Status: AC
  Administered 2015-01-25 (×2): 2.5 mg via RESPIRATORY_TRACT
  Filled 2015-01-25 (×3): qty 3

## 2015-01-25 MED ORDER — CETYLPYRIDINIUM CHLORIDE 0.05 % MT LIQD
7.0000 mL | Freq: Two times a day (BID) | OROMUCOSAL | Status: DC
  Administered 2015-01-25 – 2015-01-29 (×8): 7 mL via OROMUCOSAL

## 2015-01-25 MED ORDER — HEPARIN SODIUM (PORCINE) 5000 UNIT/ML IJ SOLN
5000.0000 [IU] | Freq: Three times a day (TID) | INTRAMUSCULAR | Status: DC
  Administered 2015-01-25 – 2015-01-29 (×13): 5000 [IU] via SUBCUTANEOUS
  Filled 2015-01-25 (×15): qty 1

## 2015-01-25 MED ORDER — IPRATROPIUM BROMIDE 0.02 % IN SOLN
0.5000 mg | Freq: Four times a day (QID) | RESPIRATORY_TRACT | Status: DC | PRN
  Administered 2015-01-26: 0.5 mg via RESPIRATORY_TRACT
  Filled 2015-01-25: qty 2.5

## 2015-01-25 MED ORDER — TECHNETIUM TC 99M DIETHYLENETRIAME-PENTAACETIC ACID: 42.1000 | Freq: Once | INTRAVENOUS | Status: DC | PRN

## 2015-01-25 MED ORDER — ALBUTEROL SULFATE (2.5 MG/3ML) 0.083% IN NEBU
2.5000 mg | INHALATION_SOLUTION | Freq: Once | RESPIRATORY_TRACT | Status: AC
  Administered 2015-01-25: 2.5 mg via RESPIRATORY_TRACT
  Filled 2015-01-25: qty 3

## 2015-01-25 MED ORDER — FUROSEMIDE 10 MG/ML IJ SOLN
20.0000 mg | Freq: Once | INTRAMUSCULAR | Status: AC
  Administered 2015-01-25: 20 mg via INTRAVENOUS
  Filled 2015-01-25: qty 2

## 2015-01-25 MED ORDER — VANCOMYCIN HCL IN DEXTROSE 1-5 GM/200ML-% IV SOLN: 1000.0000 mg | INTRAVENOUS | Status: DC

## 2015-01-25 MED ORDER — METOPROLOL TARTRATE 25 MG PO TABS
25.0000 mg | ORAL_TABLET | Freq: Two times a day (BID) | ORAL | Status: DC
  Administered 2015-01-25 – 2015-01-29 (×8): 25 mg via ORAL
  Filled 2015-01-25 (×9): qty 1

## 2015-01-25 MED ORDER — INSULIN ASPART PROT & ASPART (70-30 MIX) 100 UNIT/ML ~~LOC~~ SUSP
15.0000 [IU] | Freq: Two times a day (BID) | SUBCUTANEOUS | Status: DC
  Administered 2015-01-25 – 2015-01-29 (×8): 15 [IU] via SUBCUTANEOUS
  Filled 2015-01-25: qty 10

## 2015-01-25 MED ORDER — PIPERACILLIN-TAZOBACTAM 3.375 G IVPB 30 MIN
3.3750 g | Freq: Once | INTRAVENOUS | Status: AC
  Administered 2015-01-25: 3.375 g via INTRAVENOUS
  Filled 2015-01-25: qty 50

## 2015-01-25 MED ORDER — DOXAZOSIN MESYLATE 1 MG PO TABS
1.0000 mg | ORAL_TABLET | Freq: Every day | ORAL | Status: DC
  Administered 2015-01-25 – 2015-01-28 (×4): 1 mg via ORAL
  Filled 2015-01-25 (×5): qty 1

## 2015-01-25 MED ORDER — PIPERACILLIN-TAZOBACTAM IN DEX 2-0.25 GM/50ML IV SOLN
2.2500 g | Freq: Four times a day (QID) | INTRAVENOUS | Status: DC
  Administered 2015-01-25 – 2015-01-27 (×10): 2.25 g via INTRAVENOUS
  Filled 2015-01-25 (×13): qty 50

## 2015-01-25 MED ORDER — INSULIN ASPART 100 UNIT/ML ~~LOC~~ SOLN
0.0000 [IU] | Freq: Four times a day (QID) | SUBCUTANEOUS | Status: DC
  Administered 2015-01-25: 5 [IU] via SUBCUTANEOUS
  Administered 2015-01-25: 2 [IU] via SUBCUTANEOUS
  Administered 2015-01-25: 3 [IU] via SUBCUTANEOUS
  Administered 2015-01-26: 1 [IU] via SUBCUTANEOUS
  Filled 2015-01-25 (×2): qty 1

## 2015-01-25 NOTE — Progress Notes (Addendum)
Pt presently is in ultrasound. Phoned ICU . Awaiting a bed assignment.  Tech phoned to see if pt could have her thoracentesis tomorrow. Instructed tech to phone the ER doctor.Family remains in the room waiting -Pt presently does not have a bed in the ICU[Pt remains off the unit. She currently is having a thoracentisis. 48JE)56:31S_HF returned from all her procedures. Family remains at the bedside. Pt is in a SR w/o ectopy. Pt stated,"I feel much better." FSBS 186. 11:15a- IV in pts right forearm removed. Painful and would not flush. Presently zosyn is infusing in the 20g placed in the left hand earlier in the ER-Lung sounds are decreased throughout. Pt states she does not feel as short of breath. Minimal pedal edema. Abd soft and nontender. 11:40a-Phone Dr Eulis Foster concerning  pt receiving breathing treatments.Phoned ICU to give report.  MD will have the ICU doctor see the pt. 12:10p-Pt given a nebulizer treatment.(atrovent) ED Doctor evaluated the pt. Pt does has a very loose cough w/o production. SAT's do drop to 88 %. Following the treatment the pt will be increased to 4 liters nasal canula.12:15p Phoned ICU again to give report.Pts am BP meds held due to lower BP. ICU nurse ,Maudie Mercury made aware. Resp down to see pt. -Report to Maudie Mercury and pt will be transferred to the ICU. 12:25pm- pt is stable and ready for transport.

## 2015-01-25 NOTE — Plan of Care (Signed)
Problem: ICU Phase Progression Outcomes Goal: Flu/PneumoVaccines if indicated Outcome: Not Applicable Date Met:  22/48/25 Patient refused

## 2015-01-25 NOTE — Progress Notes (Signed)
VASCULAR LAB PRELIMINARY  PRELIMINARY  PRELIMINARY  PRELIMINARY  Bilateral lower extremity venous duplex completed.    Preliminary report:  Bilateral:  No evidence of DVT, superficial thrombosis, or Baker's Cyst.   Wynnie Pacetti, RVS 01/25/2015, 8:04 AM

## 2015-01-25 NOTE — Progress Notes (Signed)
SYMPTOM MANAGEMENT [facility]   HPI: Doris [name] 81 y.o. female diagnosed with endometrial cancer; with liver metastasis.  Patient is status post resection and is currently undergoing observation only.  Patient was diagnosed with pneumonia on 01/06/2015.  She reports completing the Levaquin antibiotic just this past Wednesday.  Patient is complaining of occasional nonproductive cough and increased shortness of breath.  She denies any obvious wheezing.  She also denies any recent fevers or chills.  HPI  ROS  Past Medical History  Diagnosis Date  . Diabetes mellitus   . Hyperlipidemia   . Hypertension   . Arthritis   . Seizures   . Enlarged heart   . Endometrial cancer   . Pneumonia     Past Surgical History  Procedure Laterality Date  . Appendectomy    . Spine surgery      neck  . Tonsillectomy    . Total abdominal hysterectomy w/ bilateral salpingoophorectomy  09/30/14    TOTAL ABDOMINAL HYSTER, INCL PARTIAL VAGINECTOMY, W/PARA-AORTIC & PELVIC LYMPH NODE BX, W/WO REM TUBE/OVARY  . 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    has No Known  Allergies.    Medication List       This list is accurate as of: 01/23/15 11:59 PM.  Always use your most recent med list.               albuterol 108 (90 BASE) MCG/ACT inhaler  Commonly known as:  PROVENTIL HFA;VENTOLIN HFA  Inhale 1-2 puffs into the lungs every 6 (six) hours as needed for wheezing or shortness of breath.     amLODipine 10 MG tablet  Commonly known as:  NORVASC  Take 1 tablet (10 mg total) by mouth daily.     atorvastatin 80 MG tablet  Commonly known as:  LIPITOR  TAKE ONE TABLET BY MOUTH ONCE DAILY     COMBIGAN 0.2-0.5 % ophthalmic solution  Generic drug:  brimonidine-timolol  Place 1 drop into both eyes daily.     hydrochlorothiazide 25 MG tablet  Commonly known as:  HYDRODIURIL  TAKE ONE TABLET BY MOUTH EVERY DAY     insulin NPH-regular Human (70-30) 100 UNIT/ML injection  Commonly known as:  NOVOLIN 70/30  Inject 20-25 Units into the skin 2 (two) times daily with a meal. Inject 25 units in AM and 20 UNits in PM with meals.     Lancets Misc  by Does not apply route.     metoprolol 200 MG 24 hr tablet  Commonly known as:  TOPROL-XL  TAKE ONE TABLET BY MOUTH ONCE DAILY     ONE TOUCH ULTRA TEST test strip  Generic drug:  glucose blood  USE ONE TEST STRIP TO CHECK BLOOD SUGAR EVERY DAY         PHYSICAL EXAMINATION  Oncology Vitals 01/25/2015 01/25/2015  01/25/2015 01/25/2015 01/25/2015 01/25/2015 01/25/2015  Height - - 174 cm - - - -  Weight - - 82 kg - - - -  Weight (lbs) - - 180 lbs 12 oz - - - -  BMI (kg/m2) - - 27.09 kg/m2 - - - -  Temp 98 98 - 98 98.3 - -  Pulse 79 95 - - - 86 92  Resp 16 19 - - - - 18  SpO2 95 96 - - - - 92  BSA (m2) - - 1.99 m2 - - - -   BP Readings from Last 3 Encounters:  01/25/15 134/54  01/23/15 143/59  01/08/15 176/77    Physical Exam  Constitutional: She is oriented to person, place, and time and well-developed, well-nourished, and in no distress.  HENT:  Head: Normocephalic and atraumatic.  Mouth/Throat:  Oropharynx is clear and moist.  Eyes: Conjunctivae and EOM are normal. Pupils are equal, round, and reactive to light. Right eye exhibits no discharge. Left eye exhibits no discharge. No scleral icterus.  Neck: Normal range of motion. Neck supple. No JVD present. No tracheal deviation present. No thyromegaly present.  Cardiovascular: Normal rate, regular rhythm, normal heart sounds and intact distal pulses.   Pulmonary/Chest: Effort normal. No stridor. No respiratory distress. She has no wheezes. She has no rales. She exhibits no tenderness.  Patient with diminished breath sounds to bilateral bases; but no cough or wheeze noted.  Also, no respiratory distress noted.  Abdominal: Soft. Bowel sounds are normal. She exhibits no distension and no mass. There is no tenderness. There is no rebound and no guarding.  Musculoskeletal: Normal range of motion. She exhibits no edema or tenderness.  Lymphadenopathy:    She has no cervical adenopathy.  Neurological: She is alert and oriented to person, place, and time. Gait normal. GCS score is 15.  Skin: Skin is warm and dry. No rash noted. No erythema. No pallor.  Psychiatric: Affect normal.  Nursing note and vitals reviewed.   LABORATORY DATA:. Appointment on 01/23/2015  Component Date Value Ref Range Status  . WBC 01/23/2015 12.9* 3.9 - 10.3 10e3/uL Final  . NEUT# 01/23/2015 10.9* 1.5 - 6.5 10e3/uL Final  . HGB 01/23/2015 9.6* 11.6 - 15.9 g/dL Final  . HCT 01/23/2015 31.3* 34.8 - 46.6 % Final  . Platelets 01/23/2015 456* 145 - 400 10e3/uL Final  . MCV 01/23/2015 74.5* 79.5 - 101.0 fL Final  . MCH 01/23/2015 22.9* 25.1 - 34.0 pg Final  . MCHC 01/23/2015 30.7* 31.5 - 36.0 g/dL Final  . RBC 01/23/2015 4.20  3.70 - 5.45 10e6/uL Final  . RDW 01/23/2015 18.7* 11.2 - 14.5 % Final  . lymph# 01/23/2015 1.2  0.9 - 3.3 10e3/uL Final  . MONO# 01/23/2015 0.6  0.1 - 0.9 10e3/uL Final  . Eosinophils Absolute 01/23/2015 0.2  0.0 - 0.5 10e3/uL Final  . Basophils  Absolute 01/23/2015 0.0  0.0 - 0.1 10e3/uL Final  . NEUT% 01/23/2015 84.8* 38.4 - 76.8 % Final  . LYMPH% 01/23/2015 9.1* 14.0 - 49.7 % Final  . MONO% 01/23/2015 4.6  0.0 - 14.0 % Final  . EOS% 01/23/2015 1.4  0.0 - 7.0 % Final  . BASO% 01/23/2015 0.1  0.0 - 2.0 % Final  . Sodium 01/23/2015 145  136 - 145 mEq/L Final  . Potassium 01/23/2015 4.7  3.5 - 5.1 mEq/L Final  . Chloride 01/23/2015 113* 98 - 109 mEq/L Final  . CO2 01/23/2015 23  22 - 29 mEq/L Final  .  Glucose 01/23/2015 131  70 - 140 mg/dl Final  . BUN 01/23/2015 66.3* 7.0 - 26.0 mg/dL Final  . Creatinine 01/23/2015 3.3* 0.6 - 1.1 mg/dL Final  . Total Bilirubin 01/23/2015 0.33  0.20 - 1.20 mg/dL Final  . Alkaline Phosphatase 01/23/2015 90  40 - 150 U/L Final  . AST 01/23/2015 <7  5 - 34 U/L Final  . ALT 01/23/2015 8  0 - 55 U/L Final  . Total Protein 01/23/2015 7.2  6.4 - 8.3 g/dL Final  . Albumin 01/23/2015 3.4* 3.5 - 5.0 g/dL Final  . Calcium 01/23/2015 9.1  8.4 - 10.4 mg/dL Final  . Anion Gap 01/23/2015 10  3 - 11 mEq/L Final  . EGFR 01/23/2015 15* >90 ml/min/1.73 m2 Final   eGFR is calculated using the CKD-EPI Creatinine Equation (2009)     RADIOGRAPHIC STUDIES: Dg Chest 1 View  01/25/2015   CLINICAL DATA:  Status post right thoracentesis.  EXAM: CHEST  1 VIEW  COMPARISON:  01/25/2015  FINDINGS: Heart is enlarged also accentuated by technique. Significant decreased size of right pleural effusion. There is elevation of the right hemidiaphragm. No pneumothorax. There is density in the medial left lung base consistent with infiltrate.  IMPRESSION: Significant decreased size of right pleural effusion. No pneumothorax. Next item persistent left lower lobe opacity consistent with infiltrate or significant atelectasis.   Electronically Signed   By: Nolon Nations M.D.   On: 01/25/2015 10:42   Dg Chest 2 View  01/25/2015   CLINICAL DATA:  Subacute onset of shortness of breath. Initial encounter.  EXAM: CHEST  2 VIEW  COMPARISON:   Chest radiograph performed 01/23/2015  FINDINGS: A moderate to large right-sided pleural effusion is noted, with right basilar airspace opacity. Mild left basilar airspace opacity is also noted. This may reflect asymmetric interstitial edema or possibly pneumonia. No pneumothorax is seen.  The cardiomediastinal silhouette is borderline enlarged. No acute osseous abnormalities are identified.  IMPRESSION: Moderate to large right-sided pleural effusion, with right basilar airspace opacity. Mild left basilar airspace opacity also seen. Borderline cardiomegaly. This may reflect asymmetric interstitial edema or possibly pneumonia. Followup PA and lateral chest X-ray is recommended in 3-4 weeks, following treatment, to ensure resolution and exclude underlying malignancy.   Electronically Signed   By: Garald Balding M.D.   On: 01/25/2015 02:05   Dg Chest 2 View  01/23/2015   CLINICAL DATA:  81 year old female with shortness of breath for 1 week. Wheezing and cough. On antibiotics for recently diagnosed pneumonia. Former smoker. Current history of endometrial carcinoma. Subsequent encounter.  EXAM: CHEST  2 VIEW  COMPARISON:  18 2016 chest radiographs. CT Abdomen and Pelvis 01/05/2015, and earlier  FINDINGS: Lower lung volumes with bilateral pleural effusions. Platelike atelectasis in the right lung. No definite air bronchograms. Normal cardiac size and mediastinal contours. Non obstructed visible bowel gas pattern. No acute osseous abnormality identified. Calcified aortic atherosclerosis.  IMPRESSION: Bilateral pleural effusions continue and appear progressed since August. Additional right lung opacity favored to be subsegmental atelectasis.  See also CT Abdomen and Pelvis report 01/05/2015 regarding new liver lesion and ascites.   Electronically Signed   By: Genevie Ann M.D.   On: 01/23/2015 13:12   Nm Pulmonary Perf And Vent  01/25/2015   CLINICAL DATA:  Ongoing shortness of breath since 01/05/2015. Symptoms worse over  the last week. Bilateral leg swelling. History of chronic kidney disease in treatment for pneumonia.  EXAM: NUCLEAR MEDICINE VENTILATION - PERFUSION LUNG SCAN  TECHNIQUE:  Ventilation images were obtained in multiple projections using inhaled aerosol Tc-70mDTPA. Perfusion images were obtained in multiple projections after intravenous injection of Tc-922mAA.  RADIOPHARMACEUTICALS:  42.1 Technetium-9979mPA aerosol inhalation and 6.2 Technetium-59m2m IV  COMPARISON:  Chest x-ray 01/25/2015  FINDINGS: Ventilation: Mild patchy ventilation consistent with clumped particles.  Perfusion: No pleural-based wedge-shaped defects. Matched deficit at the right lung base consistent with large pleural effusion as seen on chest x-ray.  IMPRESSION: Low probability for clinically significant pulmonary embolus.   Electronically Signed   By: ElizNolon Nations.   On: 01/25/2015 09:31   Us TKorearacentesis Asp Pleural Space W/img Guide  01/25/2015   INDICATION: Symptomatic right sided pleural effusion  EXAM: US TKoreaRACENTESIS ASP PLEURAL SPACE W/IMG GUIDE  COMPARISON:  Chest radiograph - 01/25/2015  MEDICATIONS: None  COMPLICATIONS: None immediate  TECHNIQUE: Informed written consent was obtained from the patient after a discussion of the risks, benefits and alternatives to treatment. A timeout was performed prior to the initiation of the procedure.  Initial ultrasound scanning demonstrates a moderate to large pleural effusion. The lower chest was prepped and draped in the usual sterile fashion. 1% lidocaine was used for local anesthesia.  An ultrasound image was saved for documentation purposes. An 8 Fr Safe-T-Centesis catheter was introduced. The thoracentesis was performed. The catheter was removed and a dressing was applied. The patient tolerated the procedure well without immediate post procedural complication. The patient was escorted to have an upright chest radiograph.  FINDINGS: A total of approximately 1.4 liters of serous,  slightly blood tinged fluid was removed. Requested samples were sent to the laboratory.  IMPRESSION: Successful ultrasound-guided right sided thoracentesis yielding 1.4 liters of pleural fluid.   Electronically Signed   By: JohnSandi Mariscal.   On: 01/25/2015 15:22    ASSESSMENT/PLAN:    Bilateral pleural effusion Patient was diagnosed with pneumonia on 01/06/2015.  She reports completing the Levaquin antibiotic just this past Wednesday.  Patient is complaining of occasional nonproductive cough and increased shortness of breath.  She denies any obvious wheezing.  She also denies any recent fevers or chills.  On exam.-Patient with decreased breath sounds to bilateral bases; but no obvious shortness of breath.  O2 sat 94% on room air.  Patient was afebrile during exam.  Chest x-ray obtained today revealed bilateral pleural effusions that continue, and appear progressed since August.  Additional right lung opacity favored to be subsegmental atelectasis.  Since patient just completed her antibiotics on Wednesday, 01/21/2015; we'll hold on initiating any new antibiotics.  Will prescribe albuterol inhaler for the patient to use on an as-needed basis.  Long discussion with both patient and her daughter regarding the need for a probable thoracentesis this coming Tuesday, 01/27/2015.  Also, advised patient and her daughter to go directly to the emergency department over the weekend if she develops any worsening symptoms whatsoever.  Endometrial cancer 1.IVB carcinosarcoma of uterus: post R0 debulking 09-30-14, with path finding of more extensive disease than was visually apparent. Recommendation was adjuvant chemotherapy with carboplatin and taxol, however renal function too poor even at baseline for platinum drugs (or ifosfamide), and patient preferred observation to any attempt at systemic treatment in adjuvant fashion. She likewise declined adjuvant RT. CT suggests early progression and she does not  clearly have infectious illness tho CXR suggests pneumonia. Will treat with antibiotics dosed for CKD, then see again.  2.Chronic kidney disease: EGFR 19-20. To see Dr PoweFlorene Glenin. 3.diabetes x 25 years on insulin,  managed by Dr Diona Browner 4.long past tobacco.  5.degenerative arthritis 6.HTN, but no indication for ETT prior to surgery per cardiology 7.advance directives not completed tho she has information  Patient continues with observation only at this time per patient's request.  Patient is scheduled to return on 01/27/2015 for follow-up visit.  Chronic kidney disease kinking patient has a history of chronic renal failure; creatinine elevated today to 3.3.  Patient has refused consideration of dialysis.  Will continue to monitor at this time.  Patient stated understanding of all instructions; and was in agreement with this plan of care. The patient knows to call the clinic with any problems, questions or concerns.   Review/collaboration with Dr. Marko Plume regarding all aspects of patient's visit today.   Total time spent with patient was 40 minutes;  with greater than 75 percent of that time spent in face to face counseling regarding patient's symptoms,  and coordination of care and follow up.  Disclaimer:This dictation was prepared with Dragon/digital dictation along with [employer]. Any transcriptional errors that result from this process are unintentional.  Drue Second, NP 01/25/2015

## 2015-01-25 NOTE — Progress Notes (Signed)
ANTIBIOTIC CONSULT NOTE - INITIAL  Pharmacy Consult for Vancomycin and Zosyn  Indication: HCAP  No Known Allergies  Patient Measurements: Weight: 182 lb 1.6 oz (82.6 kg) Adjusted Body Weight:   Vital Signs: Temp: 97.5 F (36.4 C) (09/04 0314) Temp Source: Oral (09/04 0314) BP: 152/72 mmHg (09/04 0445) Pulse Rate: 87 (09/04 0445) Intake/Output from previous day:   Intake/Output from this shift:    Labs:  Recent Labs  01/23/15 1317 01/23/15 1317 01/25/15 0104  WBC 12.9*  --  15.5*  HGB 9.6*  --  9.9*  PLT 456*  --  496*  CREATININE  --  3.3* 3.57*   Estimated Creatinine Clearance: 14.3 mL/min (by C-G formula based on Cr of 3.57). No results for input(s): VANCOTROUGH, VANCOPEAK, VANCORANDOM, GENTTROUGH, GENTPEAK, GENTRANDOM, TOBRATROUGH, TOBRAPEAK, TOBRARND, AMIKACINPEAK, AMIKACINTROU, AMIKACIN in the last 72 hours.   Microbiology: No results found for this or any previous visit (from the past 720 hour(s)).  Medical History: Past Medical History  Diagnosis Date  . Diabetes mellitus   . Hyperlipidemia   . Hypertension   . Arthritis   . Seizures   . Enlarged heart   . Endometrial cancer   . Pneumonia     Medications:  Anti-infectives    Start     Dose/Rate Route Frequency Ordered Stop   01/26/15 2200  vancomycin (VANCOCIN) IVPB 1000 mg/200 mL premix     1,000 mg 200 mL/hr over 60 Minutes Intravenous Every 48 hours 01/25/15 0511     01/25/15 0900  piperacillin-tazobactam (ZOSYN) IVPB 2.25 g     2.25 g 100 mL/hr over 30 Minutes Intravenous Every 6 hours 01/25/15 0238     01/25/15 0600  piperacillin-tazobactam (ZOSYN) IVPB 3.375 g  Status:  Discontinued     3.375 g 100 mL/hr over 30 Minutes Intravenous 3 times per day 01/25/15 0225 01/25/15 0235   01/25/15 0245  vancomycin (VANCOCIN) IVPB 1000 mg/200 mL premix     1,000 mg 200 mL/hr over 60 Minutes Intravenous  Once 01/25/15 0235 01/25/15 0443   01/25/15 0245  piperacillin-tazobactam (ZOSYN) IVPB 3.375 g      3.375 g 100 mL/hr over 30 Minutes Intravenous  Once 01/25/15 0235 01/25/15 0443     Assessment: Patient with SOB and recent PNA (8/19).  Patient did not complete antibiotics for PNA.  Patient with very poor renal function.  Goal of Therapy:  Vancomycin trough level 15-20 mcg/ml  Zosyn based on renal function   Plan:  Measure antibiotic drug levels at steady state Follow up culture results Vancomycin 1gm iv q48hr  Zosyn 3.375gm iv x1, then 2.25gm iv q6hr  Tyler Deis, Shea Stakes [place] 01/25/2015,5:15 AM

## 2015-01-25 NOTE — Progress Notes (Signed)
Rt called to assessment on  pt. When Rt enter the room RN given breathing tx. Pt currently on 2Lpm [place] sats low 90,s that dips to 88% when pt talking or eating. Rt placed pt on 4LPM [place] sats 95% with no WOB. Pt currently being move to ICU stepdown at this time.

## 2015-01-25 NOTE — Progress Notes (Signed)
Pt is sob, unable to do peak flow at this time.

## 2015-01-25 NOTE — ED Notes (Signed)
Bed: WA04 Expected date:  Expected time:  Means of arrival:  Comments: EMS 66F PNA shob

## 2015-01-25 NOTE — H&P (Signed)
[facility] History and Physical  Patient: Doris [name]  MRN: [id number]  DOB: 1933/01/15  DOS: the patient was seen and examined on 01/25/2015 PCP: Eliezer Lofts, MD  Referring physician: Dr. Canary Brim Chief Complaint: Shortness of breath  HPI: Doris [name] is a 81 y.o. female with Past medical history of endometrial cancer, on observation status post resection, essential hypertension, diabetes mellitus, chronic kidney disease, chronic anemia, former smoker. The patient is presenting with complaints of progressively worsening shortness of breath ongoing since roughly 01/05/2015. The patient was seen by her oncologist and was found to have a right-sided pneumonia and was placed on levofloxacin. The dose was adjusted by pharmacy to 250 mg and the patient had taken 5 out of 6 tablets and stopped treatment. Despite finishing antibiotics course the patient started having progressively worsening shortness of breath over the period of time. Patient denies having any fever or chills denies having any cough with expectoration denies having any fall trauma or injury does not have any chest pain. She does have orthopnea but denies any PND. Since last one week the family has also noted increasing swelling of both legs. The patient has been ambulating appropriately. Denies having any diarrhea or constipation or rectal bleeding or burning urination. Patient was on lisinopril which was discontinued and she was placed on Cardura recently.  The patient is coming from home.  At her baseline ambulates without any support And is independent for most of her ADL manages her medication on her own.  Review of Systems: as mentioned in the history of present illness.  A comprehensive review of the other systems is negative.  Past Medical History  Diagnosis Date  . Diabetes mellitus   . Hyperlipidemia   . Hypertension   . Arthritis   . Seizures   . Enlarged heart   . Endometrial cancer   . Pneumonia      Past Surgical History  Procedure Laterality Date  . Appendectomy    . Spine surgery      neck  . Tonsillectomy    . Total abdominal hysterectomy w/ bilateral salpingoophorectomy  09/30/14    TOTAL ABDOMINAL HYSTER, INCL PARTIAL VAGINECTOMY, W/PARA-AORTIC & PELVIC LYMPH NODE BX, W/WO REM TUBE/OVARY  . Omentectomy  09/30/14    OMENTECTOMY, EPIPLOECTOMY, RESECTION OF OMENTUM   Social History:  reports that she quit smoking about 4 years ago. Her smoking use included Cigarettes. She has a 30 pack-year smoking history. She has never used smokeless tobacco. She reports that she does not drink alcohol or use illicit drugs.  No Known Allergies  Family History  Problem Relation Age of Onset  . Hypertension Mother   . Heart attack Father   . Ovarian cancer Daughter 74    Prior to Admission medications   Medication Sig Start Date End Date Taking? Authorizing Provider  albuterol (PROVENTIL HFA;VENTOLIN HFA) 108 (90 BASE) MCG/ACT inhaler Inhale 1-2 puffs into the lungs every 6 (six) hours as needed for wheezing or shortness of breath. 01/23/15  Yes Susanne Borders, NP  amLODipine (NORVASC) 10 MG tablet Take 1 tablet (10 mg total) by mouth daily. 09/11/14  Yes Amy E Bedsole, MD  atorvastatin (LIPITOR) 80 MG tablet TAKE ONE TABLET BY MOUTH ONCE DAILY 04/11/14  Yes Amy E Bedsole, MD  COMBIGAN 0.2-0.5 % ophthalmic solution Place 1 drop into both eyes daily.  09/11/14  Yes Historical Provider, MD  doxazosin (CARDURA) 1 MG tablet Take 1 mg by mouth at bedtime.   Yes Historical Provider, MD  hydrochlorothiazide (HYDRODIURIL) 25 MG tablet TAKE ONE TABLET BY MOUTH EVERY DAY 09/11/14  Yes Amy Cletis Athens, MD  insulin NPH-regular Human (NOVOLIN 70/30) (70-30) 100 UNIT/ML injection Inject 20-25 Units into the skin 2 (two) times daily with a meal. Inject 25 units in AM and 20 UNits in PM with meals. 05/02/11  Yes Amy E Diona Browner, MD  metoprolol (TOPROL-XL) 200 MG 24 hr tablet TAKE ONE TABLET BY MOUTH ONCE DAILY  09/11/14  Yes Amy Cletis Athens, MD  Lancets MISC by Does not apply route.      Historical Provider, MD  ONE TOUCH ULTRA TEST test strip USE ONE TEST STRIP TO CHECK BLOOD SUGAR EVERY DAY Patient not taking: Reported on 01/25/2015 08/19/10   Jinny Sanders, MD    Physical Exam: Filed Vitals:   01/25/15 0314 01/25/15 0330 01/25/15 0400 01/25/15 0445  BP: 107/60 109/47 128/58 152/72  Pulse: 83 79 92 87  Temp: 97.5 F (36.4 C)     TempSrc: Oral     Resp: 17 17 17 25   Weight:      SpO2: 94% 94% 97% 96%    General: Alert, Awake and Oriented to Time, Place and Person. Appear in moderate distress Eyes: PERRL ENT: Oral Mucosa clear moist. Neck: no JVD Cardiovascular: S1 and S2 Present, no Murmur, Peripheral Pulses Present Respiratory: Bilateral Air entry equal and Decreased,  Basal Crackles, no wheezes Abdomen: Bowel Sound present, Soft and no tenderness Skin: no Rash Extremities: Bilateral left more than right Pedal edema, no calf tenderness Neurologic: Grossly no focal neuro deficit.  Labs on Admission:  CBC:  Recent Labs Lab 01/23/15 1317 01/25/15 0104 01/25/15 0535  WBC 12.9* 15.5* 16.1*  NEUTROABS 10.9*  --  15.3*  HGB 9.6* 9.9* 9.5*  HCT 31.3* 32.2* 30.8*  MCV 74.5* 75.9* 76.2*  PLT 456* 496* 443*    CMP     Component Value Date/Time   NA 145 01/25/2015 0104   NA 145 01/23/2015 1317   NA 142 05/04/2012   K 4.8 01/25/2015 0104   K 4.7 01/23/2015 1317   CL 113* 01/25/2015 0104   CO2 24 01/25/2015 0104   CO2 23 01/23/2015 1317   GLUCOSE 187* 01/25/2015 0104   GLUCOSE 131 01/23/2015 1317   BUN 73* 01/25/2015 0104   BUN 66.3* 01/23/2015 1317   BUN 43* 05/04/2012   CREATININE 3.57* 01/25/2015 0104   CREATININE 3.3* 01/23/2015 1317   CREATININE 2.5* 05/04/2012   CREATININE 2.24* 02/20/2012 0907   CALCIUM 8.9 01/25/2015 0104   CALCIUM 9.1 01/23/2015 1317   PROT 7.5 01/25/2015 0104   PROT 7.2 01/23/2015 1317   ALBUMIN 3.7 01/25/2015 0104   ALBUMIN 3.4* 01/23/2015  1317   AST 8* 01/25/2015 0104   AST <7 01/23/2015 1317   ALT 9* 01/25/2015 0104   ALT 8 01/23/2015 1317   ALKPHOS 81 01/25/2015 0104   ALKPHOS 90 01/23/2015 1317   BILITOT 0.5 01/25/2015 0104   BILITOT 0.33 01/23/2015 1317   GFRNONAA 11* 01/25/2015 0104   GFRAA 13* 01/25/2015 0104    No results for input(s): LIPASE, AMYLASE in the last 168 hours.  No results for input(s): CKTOTAL, CKMB, CKMBINDEX, TROPONINI in the last 168 hours. BNP (last 3 results) No results for input(s): BNP in the last 8760 hours.  ProBNP (last 3 results) No results for input(s): PROBNP in the last 8760 hours.   Radiological Exams on Admission: Dg Chest 2 View  01/25/2015   CLINICAL DATA:  Subacute onset  of shortness of breath. Initial encounter.  EXAM: CHEST  2 VIEW  COMPARISON:  Chest radiograph performed 01/23/2015  FINDINGS: A moderate to large right-sided pleural effusion is noted, with right basilar airspace opacity. Mild left basilar airspace opacity is also noted. This may reflect asymmetric interstitial edema or possibly pneumonia. No pneumothorax is seen.  The cardiomediastinal silhouette is borderline enlarged. No acute osseous abnormalities are identified.  IMPRESSION: Moderate to large right-sided pleural effusion, with right basilar airspace opacity. Mild left basilar airspace opacity also seen. Borderline cardiomegaly. This may reflect asymmetric interstitial edema or possibly pneumonia. Followup PA and lateral chest X-ray is recommended in 3-4 weeks, following treatment, to ensure resolution and exclude underlying malignancy.   Electronically Signed   By: Garald Balding M.D.   On: 01/25/2015 02:05   Dg Chest 2 View  01/23/2015   CLINICAL DATA:  81 year old female with shortness of breath for 1 week. Wheezing and cough. On antibiotics for recently diagnosed pneumonia. Former smoker. Current history of endometrial carcinoma. Subsequent encounter.  EXAM: CHEST  2 VIEW  COMPARISON:  18 2016 chest  radiographs. CT Abdomen and Pelvis 01/05/2015, and earlier  FINDINGS: Lower lung volumes with bilateral pleural effusions. Platelike atelectasis in the right lung. No definite air bronchograms. Normal cardiac size and mediastinal contours. Non obstructed visible bowel gas pattern. No acute osseous abnormality identified. Calcified aortic atherosclerosis.  IMPRESSION: Bilateral pleural effusions continue and appear progressed since August. Additional right lung opacity favored to be subsegmental atelectasis.  See also CT Abdomen and Pelvis report 01/05/2015 regarding new liver lesion and ascites.   Electronically Signed   By: Genevie Ann M.D.   On: 01/23/2015 13:12   EKG: Independently reviewed. normal sinus rhythm, nonspecific ST and T waves changes.  Assessment/Plan Principal Problem:   Pleural effusion Active Problems:   Hyperlipidemia LDL goal <100   Anemia of chronic renal failure, stage 4 (severe)   Essential hypertension, benign   Endometrial cancer   Type 2 diabetes mellitus with diabetic chronic kidney disease   Acute respiratory failure with hypoxia   HCAP (healthcare-associated pneumonia)   1. Pleural effusion The patient is presenting with numbness of progressively worsening shortness of breath. She is hypoxic requiring oxygen here in the hospital. Chest x-ray shows moderate to large right-sided pleural effusion as well as left-sided pleural effusion. This is progressively worsening over last 3 weeks and rapidly growing compared to her earlier x-ray 2 days ago. Most likely etiology is exudative pleural effusion with pneumonia, although possibility of CHF as well as PE cannot be ruled out. We will get thoracentesis in the morning and send studies. Patient will placed on incentive spirometry. BiPAP when necessary. VQ scan in the morning.  2. Pneumonia. Possible healthcare Association. She'll be treated her broadly since levofloxacin has not been effective. Pharmacy consulted for  renal dozing on antibiotics and monitoring.  3. Pedal edema. Lower extremity Doppler as well as VQ scan in the morning. Checking BNP.  4. Diabetes mellitus. Reducing her home insulin dose to 15 units with meals. Placing her on sliding scale insulin. Patient did mention that she did have some itching throughout her life while she was on some earlier type of insulin although she does not know the name of it but she was thinking it was given to her in the hospital and after switching to Novolin the itching has resolved. Chart review reveals she was on RELION type of insulin. It was discussed with the patient that since we are using  NovoLog we will closely monitor her in the hospital for any side effects.  5. Chronic kidney disease. Monitoring serial BMP avoid nephrotoxic medication.  6. Essential hypertension. Reducing metoprolol dose from 200 mg daily to 25 mg twice a day. Continuing amlodipine Cardura. Holding hydrochlorothiazide.  Advance goals of care discussion: Full code as per discussion with patient.   DVT Prophylaxis: subcutaneous Heparin Nutrition: Nothing by mouth  Family Communication: family was present at bedside, opportunity was given to ask question and all questions were answered satisfactorily at the time of interview. Disposition: Admitted as inpatient step-down unit.  Author: Berle Mull, MD [facility] Pager: [phone] 01/25/2015  If 7PM-7AM, please contact night-coverage [email or web address] Password [email or web address]

## 2015-01-25 NOTE — ED Provider Notes (Signed)
CSN: [id number]     Arrival date & time 01/25/15  0023 History  This chart was scribed for Alfonzo Beers, MD by Meriel Pica, ED Scribe. This patient was seen in room WA04/WA04 and the patient's care was started 1:14 AM.   Chief Complaint  Patient presents with  . Shortness of Breath  . Pneumonia   Patient is a 81 y.o. female presenting with shortness of breath. The history is provided by the patient and a relative. No language interpreter was used.  Shortness of Breath Severity:  Moderate Duration:  16 days Timing:  Constant Progression:  Worsening Chronicity:  New Context: activity   Relieved by:  Nothing Worsened by:  Exertion Associated symptoms: no chest pain and no fever   Risk factors: hx of cancer    HPI Comments: Navina Wohlers is a 81 y.o. female, with a PMhx of HTN, DM, cancer, and PNA, brought in by ambulance, who presents to the Emergency Department complaining of gradually worsening, constant SOB that has been present since PNA diagnosis 16 days ago. Daughter reports a progressive worsening of SOB today, especially with exertion, and worsening of baseline BLE edema. The pt was diagnosed with PNA 16 days ago for which she was prescribed an antibiotic to take every other day but did not complete the course. The pt visited her PCP 1 day ago where she had a chest Xray that showed fluid on her lungs for which she was supposed to set up an appointment to perform thoracentesis but was unable to do so. Her albuterol treatment is providing no relief of SOB. 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 Denies fever or CP.   Past Medical History  Diagnosis Date  . Diabetes mellitus   . Hyperlipidemia   . Hypertension   . Arthritis   . Seizures   . Enlarged heart   . Endometrial cancer   . Pneumonia    Past Surgical History  Procedure Laterality Date  . Appendectomy    . Spine surgery       neck  . Tonsillectomy    . Total abdominal hysterectomy w/ bilateral salpingoophorectomy  09/30/14    TOTAL ABDOMINAL HYSTER, INCL PARTIAL VAGINECTOMY, W/PARA-AORTIC & PELVIC LYMPH NODE BX, W/WO REM TUBE/OVARY  . Omentectomy  09/30/14    OMENTECTOMY, EPIPLOECTOMY, RESECTION OF OMENTUM   Family History  Problem Relation Age of Onset  . Hypertension Mother   . Heart attack Father   . Ovarian cancer Daughter 63   Social History  Substance Use Topics  . Smoking status: Former Smoker -- 0.50 packs/day for 60 years    Types: Cigarettes    Quit date: 05/23/2010  . Smokeless tobacco: Never Used  . Alcohol Use: No   OB History    No data available     Review of Systems  Constitutional: Negative for fever.  Respiratory: Positive for shortness of breath.   Cardiovascular: Positive for leg swelling. Negative for chest pain.  All other systems reviewed and are negative.  Allergies  Review of patient's allergies indicates no known allergies.  Home Medications   Prior to Admission medications   Medication Sig Start Date End Date Taking? Authorizing Provider  albuterol (PROVENTIL HFA;VENTOLIN HFA) 108 (90 BASE) MCG/ACT inhaler Inhale 1-2 puffs into the lungs every 6 (six) hours as needed for wheezing or shortness of breath. 01/23/15  Yes Susanne Borders, NP  amLODipine (NORVASC) 10 MG  tablet Take 1 tablet (10 mg total) by mouth daily. 09/11/14  Yes Amy E Bedsole, MD  atorvastatin (LIPITOR) 80 MG tablet TAKE ONE TABLET BY MOUTH ONCE DAILY 04/11/14  Yes Amy E Bedsole, MD  COMBIGAN 0.2-0.5 % ophthalmic solution Place 1 drop into both eyes daily.  09/11/14  Yes Historical Provider, MD  doxazosin (CARDURA) 1 MG tablet Take 1 mg by mouth at bedtime.   Yes Historical Provider, MD  hydrochlorothiazide (HYDRODIURIL) 25 MG tablet TAKE ONE TABLET BY MOUTH EVERY DAY 09/11/14  Yes Amy Cletis Athens, MD  insulin NPH-regular Human (NOVOLIN 70/30) (70-30) 100 UNIT/ML injection Inject 20-25 Units into the skin 2  (two) times daily with a meal. Inject 25 units in AM and 20 UNits in PM with meals. 05/02/11  Yes Amy E Diona Browner, MD  metoprolol (TOPROL-XL) 200 MG 24 hr tablet TAKE ONE TABLET BY MOUTH ONCE DAILY 09/11/14  Yes Amy Cletis [place], MD  Lancets MISC by Does not apply route.      Historical Provider, MD  ONE TOUCH ULTRA TEST test strip USE ONE TEST STRIP TO CHECK BLOOD SUGAR EVERY DAY Patient not taking: Reported on 01/25/2015 08/19/10   Amy E Diona Browner, MD   BP 127/57 mmHg  Pulse 89  Resp 18  SpO2 94% Vitals reviewed Physical Exam  Physical Examination: General appearance - alert, well appearing, and in no distress Mental status - alert, oriented to person, place, and time Eyes - no conjunctival injection, no scleral icterus Mouth - mucous membranes moist, pharynx normal without lesions Chest - decreased breath sounds at the bases bilaterally, normal respiratory effort, no wheezes, rales or rhonchi, symmetric air entry Heart - normal rate, regular rhythm, normal S1, S2, no murmurs, rubs, clicks or gallops Abdomen - soft, nontender, nondistended, no masses or organomegaly Neurological - alert, oriented, normal speech, Extremities - peripheral pulses normal, no pedal edema, no clubbing or cyanosis Skin - normal coloration and turgor, no rashes  ED Course  Procedures  DIAGNOSTIC STUDIES: Oxygen Saturation is 94% on [place] 2 L, adequate by my interpretation.    COORDINATION OF CARE: 1:21 AM Discussed treatment plan which includes to order a chest Xray and diagnostic labs with pt and daughter. Pt and daughter acknowledge and agree to plan.   3:57 AM d/w Dr. Posey Pronto for admission to hospitalist  Labs Review Labs Reviewed  CBC - Abnormal; Notable for the following:    WBC 15.5 (*)    Hemoglobin 9.9 (*)    HCT 32.2 (*)    MCV 75.9 (*)    MCH 23.3 (*)    RDW 18.2 (*)    Platelets 496 (*)    All other components within normal limits  COMPREHENSIVE METABOLIC PANEL - Abnormal; Notable for the following:     Chloride 113 (*)    Glucose, Bld 187 (*)    BUN 73 (*)    Creatinine, Ser 3.57 (*)    AST 8 (*)    ALT 9 (*)    GFR calc non Af Amer 11 (*)    GFR calc Af Amer 13 (*)    All other components within normal limits  BLOOD GAS, VENOUS - Abnormal; Notable for the following:    pH, Ven 7.246 (*)    pCO2, Ven 51.7 (*)    Acid-base deficit 5.1 (*)    All other components within normal limits  CBC WITH DIFFERENTIAL/PLATELET - Abnormal; Notable for the following:    WBC 16.1 (*)    Hemoglobin  9.5 (*)    HCT 30.8 (*)    MCV 76.2 (*)    MCH 23.5 (*)    RDW 18.3 (*)    Platelets 443 (*)    Neutrophils Relative % 95 (*)    Neutro Abs 15.3 (*)    Lymphocytes Relative 5 (*)    Monocytes Relative 0 (*)    All other components within normal limits  CBG MONITORING, ED - Abnormal; Notable for the following:    Glucose-Capillary 219 (*)    All other components within normal limits  CULTURE, EXPECTORATED SPUTUM-ASSESSMENT  GRAM STAIN  CULTURE, BLOOD (ROUTINE X 2)  CULTURE, BLOOD (ROUTINE X 2)  HIV ANTIBODY (ROUTINE TESTING)  LEGIONELLA ANTIGEN, URINE  STREP PNEUMONIAE URINARY ANTIGEN  COMPREHENSIVE METABOLIC PANEL  LACTIC ACID, PLASMA  LACTIC ACID, PLASMA  PROCALCITONIN  PROTIME-INR  APTT  BRAIN NATRIURETIC PEPTIDE  TSH  I-STAT TROPOININ, ED    Imaging Review Dg Chest 2 View  01/25/2015   CLINICAL DATA:  Subacute onset of shortness of breath. Initial encounter.  EXAM: CHEST  2 VIEW  COMPARISON:  Chest radiograph performed 01/23/2015  FINDINGS: A moderate to large right-sided pleural effusion is noted, with right basilar airspace opacity. Mild left basilar airspace opacity is also noted. This may reflect asymmetric interstitial edema or possibly pneumonia. No pneumothorax is seen.  The cardiomediastinal silhouette is borderline enlarged. No acute osseous abnormalities are identified.  IMPRESSION: Moderate to large right-sided pleural effusion, with right basilar airspace opacity. Mild  left basilar airspace opacity also seen. Borderline cardiomegaly. This may reflect asymmetric interstitial edema or possibly pneumonia. Followup PA and lateral chest X-ray is recommended in 3-4 weeks, following treatment, to ensure resolution and exclude underlying malignancy.   Electronically Signed   By: Garald Balding M.D.   On: 01/25/2015 02:05   Dg Chest 2 View  01/23/2015   CLINICAL DATA:  81 year old female with shortness of breath for 1 week. Wheezing and cough. On antibiotics for recently diagnosed pneumonia. Former smoker. Current history of endometrial carcinoma. Subsequent encounter.  EXAM: CHEST  2 VIEW  COMPARISON:  18 2016 chest radiographs. CT Abdomen and Pelvis 01/05/2015, and earlier  FINDINGS: Lower lung volumes with bilateral pleural effusions. Platelike atelectasis in the right lung. No definite air bronchograms. Normal cardiac size and mediastinal contours. Non obstructed visible bowel gas pattern. No acute osseous abnormality identified. Calcified aortic atherosclerosis.  IMPRESSION: Bilateral pleural effusions continue and appear progressed since August. Additional right lung opacity favored to be subsegmental atelectasis.  See also CT Abdomen and Pelvis report 01/05/2015 regarding new liver lesion and ascites.   Electronically Signed   By: Genevie Ann M.D.   On: 01/23/2015 13:12   I have personally reviewed and evaluated these images and lab results as part of my medical decision-making.   EKG Interpretation   Date/Time:  Sunday January 25 2015 01:25:28 EDT Ventricular Rate:  90 PR Interval:  169 QRS Duration: 90 QT Interval:  381 QTC Calculation: 466 R Axis:   38 Text Interpretation:  Sinus rhythm Borderline T wave abnormalities  Artifact in lead(s) I III aVL No old tracing to compare Confirmed by  [facility]  MD, MARTHA [id number]) on 01/25/2015 6:13:14 AM      MDM   Final diagnoses:  HCAP (healthcare-associated pneumonia)  Shortness of breath  Metastatic cancer  bilateral  pleural effusions  Pt presenting with increased shortness of breath on exertion, was treated for pneumonia with levaquin but continued to have worsening shortness of breath.  CXR shows pleural effusion and overlying pneumonia on xray.  Will start on broad spectrum abx as failed levaquin- pt will likely need thoracentesis but does not need emergently tonight as she has only 2L O2 requirement.  [facility].      I personally performed the services described in this documentation, which was scribed in my presence. The recorded information has been reviewed and is accurate.    Alfonzo Beers, MD 01/28/15 2004

## 2015-01-25 NOTE — Progress Notes (Signed)
Pt admitted after midnight, please see earlier admission note by Dr. Posey Pronto. Pt was admitted for evaluation of dyspnea, imaging studies notable for bilateral pleural effusions, R>L. Dr. Posey Pronto spoke with PCCM, recommended thoracentesis, this has been done, pt is more comfortable but still hypoxic. Fluid was sent for analysis, may need repeat thoracentesis on the left side in next 24 hours. This was thought to be related to ? HCAP vs diastolic CHF. Follow up on repeat CXR, V/Q scan also pending to rule out PE. Monitor in SDU.  Faye Ramsay, MD  [facility] Pager [phone]  If 7PM-7AM, please contact night-coverage [email or web address] Password [email or web address]

## 2015-01-25 NOTE — ED Notes (Signed)
Per EMS pt. From home with complaint of SOB which started Friday , pt. Stated that she was diagnosed of pneumonia last Aug. 19,2016 ,was on antibiotic but did not complete medicine. Pt. Was also told that she has fluids on her bil. Lungs and was scheduled to come back to her Primary MD last Friday for a procedure but  missed the call. This evening pt. Stated that SOB is getting worst. breathing treatment given per EMS , with on going neb upon arrival to ED.

## 2015-01-25 NOTE — ED Notes (Signed)
Patient transported to VQ scanner with interventional tech.

## 2015-01-25 NOTE — Procedures (Signed)
Successful US guided rgiht sided thoracentesis yielding 1.4  L of serous, slightly blood tinged pleural fluid.   Samples sent to lab for analysis. No immediate complications.  Ronny Bacon, MD Pager #: [phone]

## 2015-01-25 NOTE — ED Notes (Addendum)
[place] tech here to do venous doppler.

## 2015-01-25 NOTE — Assessment & Plan Note (Signed)
Patient was diagnosed with pneumonia on 01/06/2015.  She reports completing the Levaquin antibiotic just this past Wednesday.  Patient is complaining of occasional nonproductive cough and increased shortness of breath.  She denies any obvious wheezing.  She also denies any recent fevers or chills.  On exam.-Patient with decreased breath sounds to bilateral bases; but no obvious shortness of breath.  O2 sat 94% on room air.  Patient was afebrile during exam.  Chest x-ray obtained today revealed bilateral pleural effusions that continue, and appear progressed since August.  Additional right lung opacity favored to be subsegmental atelectasis.  Since patient just completed her antibiotics on Wednesday, 01/21/2015; we'll hold on initiating any new antibiotics.  Will prescribe albuterol inhaler for the patient to use on an as-needed basis.  Long discussion with both patient and her daughter regarding the need for a probable thoracentesis this coming Tuesday, 01/27/2015.  Also, advised patient and her daughter to go directly to the emergency department over the weekend if she develops any worsening symptoms whatsoever.

## 2015-01-25 NOTE — ED Notes (Signed)
Notified TCU RN that pt has ICU bed at this time. Advised that this RN will transport after report has been called. RT notified that pt has Bipap ordered if needed and to please come assess pt resp status. Pt in NAD

## 2015-01-25 NOTE — ED Notes (Addendum)
TCU Rn called and advised this Agricultural consultant that ICU had just decided which nurse was going to take the pt and that she would have to cal back. Advised TCU RN to call back within 15 minutes to give report to her or ICU Charge RN d/t pt being in dept over 11 hours at this time.

## 2015-01-25 NOTE — Assessment & Plan Note (Signed)
1.IVB carcinosarcoma of uterus: post R0 debulking 09-30-14, with path finding of more extensive disease than was visually apparent. Recommendation was adjuvant chemotherapy with carboplatin and taxol, however renal function too poor even at baseline for platinum drugs (or ifosfamide), and patient preferred observation to any attempt at systemic treatment in adjuvant fashion. She likewise declined adjuvant RT. CT suggests early progression and she does not clearly have infectious illness tho CXR suggests pneumonia. Will treat with antibiotics dosed for CKD, then see again.  2.Chronic kidney disease: EGFR 19-20. To see Dr Florene Glen again. 3.diabetes x 25 years on insulin, managed by Dr Diona Browner 4.long past tobacco.  5.degenerative arthritis 6.HTN, but no indication for ETT prior to surgery per cardiology 7.advance directives not completed tho she has information  Patient continues with observation only at this time per patient's request.  Patient is scheduled to return on 01/27/2015 for follow-up visit.

## 2015-01-25 NOTE — Assessment & Plan Note (Signed)
kinking patient has a history of chronic renal failure; creatinine elevated today to 3.3.  Patient has refused consideration of dialysis.  Will continue to monitor at this time.

## 2015-01-25 NOTE — ED Provider Notes (Signed)
11:50- I was asked to evaluate the patient, for cough. She is awaiting placement in ICU for treatment of shortness of breath. Her vital signs are stable. She states that she feels better after having her right-sided thoracocentesis performed by interventional radiology. Lungs have somewhat diminished air movement bilaterally, and scattered rhonchi and wheezes on right base. She is not in respiratory distress.  Albuterol Atrovent nebulizer ordered with repeat dosing ordered.  Patient appears improved, and likely can be treated and either stepdown or telemetry setting.  I will discuss the case with the attending hospitalist physician.  Daleen Bo, MD 01/25/15 [phone]

## 2015-01-26 ENCOUNTER — Inpatient Hospital Stay (HOSPITAL_COMMUNITY): Payer: Medicare Other

## 2015-01-26 DIAGNOSIS — R0600 Dyspnea, unspecified: Secondary | ICD-10-CM

## 2015-01-26 LAB — CBC
HEMATOCRIT: 28.3 % — AB (ref 36.0–46.0)
HEMOGLOBIN: 8.5 g/dL — AB (ref 12.0–15.0)
MCH: 22.8 pg — AB (ref 26.0–34.0)
MCHC: 30 g/dL (ref 30.0–36.0)
MCV: 75.9 fL — ABNORMAL LOW (ref 78.0–100.0)
Platelets: 404 10*3/uL — ABNORMAL HIGH (ref 150–400)
RBC: 3.73 MIL/uL — ABNORMAL LOW (ref 3.87–5.11)
RDW: 18.2 % — ABNORMAL HIGH (ref 11.5–15.5)
WBC: 17.9 10*3/uL — ABNORMAL HIGH (ref 4.0–10.5)

## 2015-01-26 LAB — GLUCOSE, CAPILLARY
GLUCOSE-CAPILLARY: 132 mg/dL — AB (ref 65–99)
GLUCOSE-CAPILLARY: 90 mg/dL (ref 65–99)
GLUCOSE-CAPILLARY: 125 mg/dL — AB (ref 65–99)
Glucose-Capillary: 145 mg/dL — ABNORMAL HIGH (ref 65–99)

## 2015-01-26 LAB — BASIC METABOLIC PANEL
Anion gap: 5 (ref 5–15)
BUN: 71 mg/dL — AB (ref 6–20)
CHLORIDE: 114 mmol/L — AB (ref 101–111)
CO2: 24 mmol/L (ref 22–32)
CREATININE: 3.25 mg/dL — AB (ref 0.44–1.00)
Calcium: 8.3 mg/dL — ABNORMAL LOW (ref 8.9–10.3)
GFR calc non Af Amer: 12 mL/min — ABNORMAL LOW (ref >60)
GFR, EST AFRICAN AMERICAN: 14 mL/min — AB (ref >60)
Glucose, Bld: 164 mg/dL — ABNORMAL HIGH (ref 65–99)
Potassium: 4.8 mmol/L (ref 3.5–5.1)
Sodium: 143 mmol/L (ref 135–145)

## 2015-01-26 LAB — LEGIONELLA ANTIGEN, URINE

## 2015-01-26 MED ORDER — INSULIN ASPART 100 UNIT/ML ~~LOC~~ SOLN
0.0000 [IU] | Freq: Four times a day (QID) | SUBCUTANEOUS | Status: DC
  Administered 2015-01-27: 2 [IU] via SUBCUTANEOUS
  Administered 2015-01-27: 1 [IU] via SUBCUTANEOUS

## 2015-01-26 MED ORDER — VANCOMYCIN HCL IN DEXTROSE 1-5 GM/200ML-% IV SOLN
1000.0000 mg | INTRAVENOUS | Status: DC
  Administered 2015-01-27: 1000 mg via INTRAVENOUS
  Filled 2015-01-26: qty 200

## 2015-01-26 NOTE — Progress Notes (Addendum)
Patient ID: Doris [name], female   DOB: 1932-09-17, 81 y.o.   MRN: [id number]  [facility] PROGRESS NOTE  Doris [name] WUJ:[id number] DOB: 1932-09-06 DOA: 01/25/2015 PCP: Eliezer Lofts, MD   Brief narrative:    81 y.o. female with known endometrial cancer, on observation status post resection, essential hypertension, diabetes mellitus, chronic kidney disease, chronic anemia, former smoker, presented to [facility] ED with main concern of dyspnea with exertion and at rest, associated with mixed episodes of non productive and productive cough of clear sputum. Pt was recently treated with Levaquin for presumptive right lobe PNA with Levaquin about two weeks prior to this admission but her symptoms have gotten worse.   Assessment/Plan:    Acute hypoxic respiratory failure - secondary to bilateral pleural effusions of unknown etiology, HCAP, ? 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 - continue Norvasc and Metoprolol for now  Anemia of chronic disease, IDA - no signs of active bleeding - CBC in AM  DVT prophylaxis - Heparin SQ  Code Status: Full.  Family Communication:  plan of care discussed with the patient and daughter at bedside  Disposition Plan: Home when stable.   IV access:  Peripheral IV  Procedures and diagnostic studies:    Dg Chest 1 View 01/25/2015   Significant decreased size of right pleural effusion. No  pneumothorax. Next item persistent left lower lobe opacity consistent with infiltrate or significant atelectasis.     Dg Chest 2 View 01/25/2015  Moderate to large right-sided pleural effusion, with right basilar airspace opacity. Mild left basilar airspace opacity also seen. Borderline cardiomegaly. This may reflect asymmetric interstitial edema or possibly pneumonia. Followup PA and lateral chest X-ray is recommended in 3-4 weeks, following treatment, to ensure resolution and exclude underlying malignancy.    Nm Pulmonary Perf And Vent 01/25/2015  Low probability for clinically significant pulmonary embolus.    US Thoracentesis Asp Pleural Space W/img Guide 01/25/2015  Successful ultrasound-guided right sided thoracentesis yielding 1.4 liters of pleural fluid.      Medical Consultants:  IR  Other Consultants:  None  IAnti-Infectives:   Vancomycin 9/4 --> Zosyn 9/4 -->  Faye Ramsay, MD  [facility] Pager [phone]   If 7PM-7AM, please contact night-coverage [email or web address] Password [facility] 01/26/2015, 6:48 PM   LOS: 1 day   HPI/Subjective: No events overnight.   Objective: Filed Vitals:   01/26/15 1005 01/26/15 1200 01/26/15 1505 01/26/15 1637  BP: 125/45  144/55   Pulse: 79  87   Temp:  98.6 F (37 C)  98.7 F (37.1 C)  TempSrc:  Oral  Oral  Resp: 14  22   Height:      Weight:      SpO2: 100%  99%     Intake/Output Summary (Last 24 hours) at 01/26/15 1848 Last data filed at 01/26/15 1459  Gross per 24 hour  Intake    320 ml  Output    600 ml  Net   -280 ml    Exam:   General:  Pt is alert, follows commands appropriately, not in acute distress  Cardiovascular: Regular rate and rhythm, no rubs, no gallops  Respiratory: Crackles at bases   Abdomen: Soft, non tender, non distended, bowel sounds present, no guarding  Extremities: +1 bilateral LE pitting edema, pulses DP and PT palpable bilaterally  Neuro: Grossly nonfocal  Data Reviewed: Basic Metabolic  Panel:  Recent Labs Lab 01/23/15 1317 01/25/15 0104 01/25/15 0535 01/26/15 0346  NA 145 145 141 143  K 4.7 4.8 5.0 4.8  CL  --  113* 111 114*  CO2 23 24 20* 24  GLUCOSE 131 187* 211* 164*  BUN 66.3* 73* 72* 71*  CREATININE 3.3* 3.57* 3.50* 3.25*  CALCIUM 9.1 8.9 8.7* 8.3*   Liver Function Tests:  Recent Labs Lab 01/23/15 1317 01/25/15 0104 01/25/15 0535  AST <7 8* 8*  ALT 8 9* 11*  ALKPHOS 90 81 80  BILITOT 0.33 0.5 0.2*  PROT 7.2 7.5 7.4  ALBUMIN 3.4* 3.7 3.6   CBC:  Recent Labs Lab 01/23/15 1317 01/25/15 0104 01/25/15 0535 01/26/15 0346  WBC 12.9* 15.5* 16.1* 17.9*  NEUTROABS 10.9*  --  15.3*  --   HGB 9.6* 9.9* 9.5* 8.5*  HCT 31.3* 32.2* 30.8* 28.3*  MCV 74.5* 75.9* 76.2* 75.9*  PLT 456* 496* 443* 404*   CBG:  Recent Labs Lab 01/25/15 1113 01/25/15 1725 01/25/15 2340 01/26/15 0533 01/26/15 1704  GLUCAP 186* 276* 145* 132* 125*    Recent Results (from the past 240 hour(s))  Culture, blood (x 2)     Status: None (Preliminary result)   Collection Time: 01/25/15  5:36 AM  Result Value Ref Range Status   Specimen Description BLOOD RIGHT WRIST  Final   Special Requests BOTTLES DRAWN AEROBIC AND ANAEROBIC 6CC  Final   Culture   Final    NO GROWTH 1 DAY Performed at [facility]    Report Status PENDING  Incomplete  Culture, blood (x 2)     Status: None (Preliminary result)   Collection Time: 01/25/15  5:40 AM  Result Value Ref Range Status   Specimen Description BLOOD BLOOD LEFT FOREARM  Final   Special Requests BOTTLES DRAWN AEROBIC AND ANAEROBIC 5ML  Final   Culture   Final    NO GROWTH 1 DAY Performed at [facility]    Report Status PENDING  Incomplete  Body fluid culture     Status: None (Preliminary result)   Collection Time: 01/25/15 10:31 AM  Result Value Ref Range Status   Specimen Description PLEURAL RIGHT  Final   Special Requests NONE  Final   Gram Stain   Final    MODERATE WBC PRESENT, PREDOMINANTLY  MONONUCLEAR NO ORGANISMS SEEN GRAM STAIN REVIEWED-AGREE WITH RESULT K WOOLEN    Culture   Final    NO GROWTH < 24 HOURS Performed at [facility]    Report Status PENDING  Incomplete  MRSA PCR Screening     Status: None   Collection Time: 01/25/15  2:40 PM  Result Value Ref Range Status   MRSA by PCR NEGATIVE NEGATIVE Final    Comment:        The GeneXpert MRSA Assay (FDA approved for NASAL specimens only), is one component of a comprehensive MRSA colonization surveillance program. It is not intended to diagnose MRSA infection nor  to guide or monitor treatment for MRSA infections.      Scheduled Meds: . amLODipine  10 mg Oral Daily  . antiseptic oral rinse  7 mL Mouth Rinse BID  . doxazosin  1 mg Oral QHS  . heparin  5,000 Units Subcutaneous 3 times per day  . insulin aspart  0-9 Units Subcutaneous Q6H  . insulin aspart protamine- aspart  15 Units Subcutaneous BID WC  . metoprolol tartrate  25 mg Oral BID  . piperacillin-tazobactam (ZOSYN)  IV  2.25 g Intravenous Q6H  . [START ON 01/27/2015] vancomycin  1,000 mg Intravenous Q48H   Continuous Infusions:

## 2015-01-26 NOTE — Progress Notes (Signed)
Echocardiogram 2D Echocardiogram has been performed.  Doris [name] 01/26/2015, 12:53 PM

## 2015-01-27 ENCOUNTER — Inpatient Hospital Stay (HOSPITAL_COMMUNITY): Payer: Medicare Other

## 2015-01-27 ENCOUNTER — Ambulatory Visit: Payer: Medicare Other | Admitting: Oncology

## 2015-01-27 DIAGNOSIS — N289 Disorder of kidney and ureter, unspecified: Secondary | ICD-10-CM

## 2015-01-27 DIAGNOSIS — N189 Chronic kidney disease, unspecified: Secondary | ICD-10-CM

## 2015-01-27 DIAGNOSIS — E119 Type 2 diabetes mellitus without complications: Secondary | ICD-10-CM

## 2015-01-27 DIAGNOSIS — M1990 Unspecified osteoarthritis, unspecified site: Secondary | ICD-10-CM

## 2015-01-27 DIAGNOSIS — I10 Essential (primary) hypertension: Secondary | ICD-10-CM

## 2015-01-27 DIAGNOSIS — J9600 Acute respiratory failure, unspecified whether with hypoxia or hypercapnia: Secondary | ICD-10-CM

## 2015-01-27 DIAGNOSIS — C55 Malignant neoplasm of uterus, part unspecified: Secondary | ICD-10-CM

## 2015-01-27 LAB — GLUCOSE, CAPILLARY
GLUCOSE-CAPILLARY: 131 mg/dL — AB (ref 65–99)
GLUCOSE-CAPILLARY: 133 mg/dL — AB (ref 65–99)
GLUCOSE-CAPILLARY: 91 mg/dL (ref 65–99)
GLUCOSE-CAPILLARY: 95 mg/dL (ref 65–99)
Glucose-Capillary: 86 mg/dL (ref 65–99)
Glucose-Capillary: 64 mg/dL — ABNORMAL LOW (ref 65–99)
Glucose-Capillary: 67 mg/dL (ref 65–99)

## 2015-01-27 LAB — BASIC METABOLIC PANEL
ANION GAP: 7 (ref 5–15)
BUN: 74 mg/dL — ABNORMAL HIGH (ref 6–20)
CALCIUM: 8.4 mg/dL — AB (ref 8.9–10.3)
CO2: 24 mmol/L (ref 22–32)
CREATININE: 3.2 mg/dL — AB (ref 0.44–1.00)
Chloride: 113 mmol/L — ABNORMAL HIGH (ref 101–111)
GFR, EST AFRICAN AMERICAN: 15 mL/min — AB (ref >60)
GFR, EST NON AFRICAN AMERICAN: 13 mL/min — AB (ref >60)
Glucose, Bld: 103 mg/dL — ABNORMAL HIGH (ref 65–99)
Potassium: 4.4 mmol/L (ref 3.5–5.1)
SODIUM: 144 mmol/L (ref 135–145)

## 2015-01-27 LAB — CBC
HCT: 27.9 % — ABNORMAL LOW (ref 36.0–46.0)
Hemoglobin: 8.7 g/dL — ABNORMAL LOW (ref 12.0–15.0)
MCH: 23.7 pg — ABNORMAL LOW (ref 26.0–34.0)
MCHC: 31.2 g/dL (ref 30.0–36.0)
MCV: 76 fL — ABNORMAL LOW (ref 78.0–100.0)
PLATELETS: 433 10*3/uL — AB (ref 150–400)
RBC: 3.67 MIL/uL — ABNORMAL LOW (ref 3.87–5.11)
RDW: 18.3 % — AB (ref 11.5–15.5)
WBC: 13.4 10*3/uL — AB (ref 4.0–10.5)

## 2015-01-27 MED ORDER — LEVOFLOXACIN 500 MG PO TABS: 500.0000 mg | ORAL_TABLET | Freq: Every day | ORAL | Status: DC

## 2015-01-27 MED ORDER — LEVOFLOXACIN 750 MG PO TABS
750.0000 mg | ORAL_TABLET | Freq: Once | ORAL | Status: AC
  Administered 2015-01-28: 750 mg via ORAL
  Filled 2015-01-27: qty 1

## 2015-01-27 MED ORDER — FUROSEMIDE 10 MG/ML IJ SOLN
20.0000 mg | Freq: Every day | INTRAMUSCULAR | Status: DC
  Administered 2015-01-27 – 2015-01-28 (×2): 20 mg via INTRAVENOUS
  Filled 2015-01-27 (×2): qty 2

## 2015-01-27 MED ORDER — LEVOFLOXACIN 500 MG PO TABS: 500.0000 mg | ORAL_TABLET | ORAL | Status: DC

## 2015-01-27 NOTE — Procedures (Signed)
Successful US guided left thoracentesis. Yielded 550 ml of serous fluid. Pt tolerated procedure well. No immediate complications.  Specimen was not sent for labs. CXR ordered.  Tsosie Billing D PA-C 01/27/2015 11:27 AM

## 2015-01-27 NOTE — Care Management Note (Signed)
Case Management Note  Patient Details  Name: Ferrin Liebig MRN: [id number] Date of Birth: 11-14-1932  Subjective/Objective:               sepsis     Action/Plan:Date:  Sept.6, 2016 U.R. performed for needs and level of care. Will continue to follow for Case Management needs.  Velva Harman, RN, BSN, [place]   [phone]   Expected Discharge Date:  01/28/15               Expected Discharge Plan:  Home/Self Care  In-House Referral:  NA  Discharge planning Services  CM Consult  Post Acute Care Choice:  NA Choice offered to:  NA  DME Arranged:    DME Agency:     HH Arranged:    HH Agency:     Status of Service:  In process, will continue to follow  Medicare Important Message Given:    Date Medicare IM Given:    Medicare IM give by:    Date Additional Medicare IM Given:    Additional Medicare Important Message give by:     If discussed at [place] of Stay Meetings, dates discussed:    Additional Comments:  Leeroy Cha, RN 01/27/2015, 11:26 AM

## 2015-01-27 NOTE — Progress Notes (Addendum)
Patient ID: Doris [name], female   DOB: 05-06-1933, 81 y.o.   MRN: [id number]  [facility] PROGRESS NOTE  Doris [name] HAL:[id number] DOB: [birth year] DOA: 01/25/2015 PCP: Eliezer Lofts, MD   Brief narrative:    81 y.o. female with known endometrial cancer, on observation status post resection, essential hypertension, diabetes mellitus, chronic kidney disease, chronic anemia, former smoker, presented to [facility] ED with main concern of dyspnea with exertion and at rest, associated with mixed episodes of non productive and productive cough of clear sputum. Pt was recently treated with Levaquin for presumptive right lobe PNA with Levaquin about two weeks prior to this admission but her symptoms have gotten worse.   Assessment/Plan:    Acute hypoxic respiratory failure - secondary to bilateral pleural effusions, HCAP, acute diastolic CHF - pt doing better this AM, still with exertional dyspnea but says much improved  - treating for HCAP given recent treatment with Levaquin for CAP, WBC trending down from 17 K --> 13 K this AM - today is day #3/7 of ABX vancomycin and zosyn, transition to oral Levaquin starting 9/7 - s/p right side thoracentesis with 1.4 L fluid removed (9/4), fluid analysis pending  - s/p left side thoracentesis 550 cc fluid removed (9/6)  Acute diastolic CHF - clinically improving, minimal LE edema this AM with minimal bibasilar crackles  - 2 D ECHO with grade I diastolic CHF - weight is down from 182 --> 180 lbs this AM - continue lasix 30 mg IV and monitor strict I/O, daily weights  - change Lasix in AM to PO   Acute on CKD stage III, Cr at baseline ~ 2.8 (was as high as 4 about three months ago) - Cr slowly trending down - avoid nephrotoxins  - BMP in AM  IVB carcinosarcoma of uterus - post R0 debulking at [facility] 09-30-14 with path finding of extensive disease  - renal function too poor for adjuvant chemotherapy and patient additionally did not want treatment -  appreciate Dr. 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 - continue Norvasc and Metoprolol for now  Anemia of chronic disease, IDA, malignancy  - no signs of active bleeding - CBC in AM  DVT prophylaxis - Heparin SQ  Code Status: Full.  Family Communication:  plan of care discussed with the patient and daughter at bedside  Disposition Plan: Home in 24 - 48 hours   IV access:  Peripheral IV  Procedures and diagnostic studies:    Dg Chest 1 View 01/25/2015   Significant decreased size of right pleural effusion. No pneumothorax. Next item persistent left lower lobe opacity consistent with infiltrate or significant atelectasis.     Dg Chest 2 View 01/25/2015  Moderate to large right-sided pleural effusion, with right basilar airspace opacity. Mild left basilar airspace opacity also seen. Borderline cardiomegaly. This may reflect asymmetric interstitial edema or possibly pneumonia. Followup PA and lateral chest X-ray is recommended in 3-4 weeks, following treatment, to ensure resolution and exclude underlying malignancy.    Nm Pulmonary Perf And Vent 01/25/2015  Low probability for clinically significant pulmonary embolus.    US Thoracentesis Asp Pleural Space W/img Guide 01/25/2015  Successful ultrasound-guided right sided thoracentesis yielding 1.4 liters of pleural fluid.      Medical Consultants:  IR  Other Consultants:  None  IAnti-Infectives:   Vancomycin 9/4 --> 9/6 Zosyn 9/4 --> 9/6 Levaquin 9/7 -->  Doris [name],  Doris Eaton, MD  [facility] Pager [phone]   If 7PM-7AM, please contact night-coverage [email or web address] Password [facility] 01/27/2015, 6:35 PM   LOS: 2 days   HPI/Subjective: No events overnight.   Objective: Filed Vitals:   01/27/15 1110 01/27/15 1115 01/27/15 1120 01/27/15 1158  BP: 136/61 138/61 109/62 143/59  Pulse:    77  Temp:    98.1 F (36.7 C)  TempSrc:    Oral   Resp:    17  Height:      Weight:      SpO2:    96%    Intake/Output Summary (Last 24 hours) at 01/27/15 1835 Last data filed at 01/27/15 5597  Gross per 24 hour  Intake    590 ml  Output    275 ml  Net    315 ml    Exam:   General:  Pt is alert, follows commands appropriately, not in acute distress  Cardiovascular: Regular rate and rhythm, no rubs, no gallops  Respiratory: Crackles at bases very minimal   Abdomen: Soft, non tender, non distended, bowel sounds present, no guarding  Extremities: trace bilateral LE pitting edema, pulses DP and PT palpable bilaterally  Neuro: Grossly nonfocal  Data Reviewed: Basic Metabolic Panel:  Recent Labs Lab 01/23/15 1317 01/25/15 0104 01/25/15 0535 01/26/15 0346 01/27/15 0345  NA 145 145 141 143 144  K 4.7 4.8 5.0 4.8 4.4  CL  --  113* 111 114* 113*  CO2 23 24 20* 24 24  GLUCOSE 131 187* 211* 164* 103*  BUN 66.3* 73* 72* 71* 74*  CREATININE 3.3* 3.57* 3.50* 3.25* 3.20*  CALCIUM 9.1 8.9 8.7* 8.3* 8.4*   Liver Function Tests:  Recent Labs Lab 01/23/15 1317 01/25/15 0104 01/25/15 0535  AST <7 8* 8*  ALT 8 9* 11*  ALKPHOS 90 81 80  BILITOT 0.33 0.5 0.2*  PROT 7.2 7.5 7.4  ALBUMIN 3.4* 3.7 3.6   CBC:  Recent Labs Lab 01/23/15 1317 01/25/15 0104 01/25/15 0535 01/26/15 0346 01/27/15 0345  WBC 12.9* 15.5* 16.1* 17.9* 13.4*  NEUTROABS 10.9*  --  15.3*  --   --   HGB 9.6* 9.9* 9.5* 8.5* 8.7*  HCT 31.3* 32.2* 30.8* 28.3* 27.9*  MCV 74.5* 75.9* 76.2* 75.9* 76.0*  PLT 456* 496* 443* 404* 433*   CBG:  Recent Labs Lab 01/27/15 0813 01/27/15 1202 01/27/15 1228 01/27/15 1250 01/27/15 1737  GLUCAP 86 64* 67 95 131*    Recent Results (from the past 240 hour(s))  Culture, blood (x 2)     Status: None (Preliminary result)   Collection Time: 01/25/15  5:36 AM  Result Value Ref Range Status   Specimen Description BLOOD RIGHT WRIST  Final   Special Requests BOTTLES DRAWN AEROBIC AND ANAEROBIC 6CC  Final    Culture   Final    NO GROWTH 2 DAYS Performed at [facility]    Report Status PENDING  Incomplete  Culture, blood (x 2)     Status: None (Preliminary result)   Collection Time: 01/25/15  5:40 AM  Result Value Ref Range Status   Specimen Description BLOOD BLOOD LEFT FOREARM  Final   Special Requests BOTTLES DRAWN AEROBIC AND ANAEROBIC 5ML  Final   Culture   Final    NO GROWTH 2 DAYS Performed at [facility]    Report Status PENDING  Incomplete   Report Status PENDING  Incomplete  MRSA PCR Screening     Status: None   Collection Time: 01/25/15  2:40 PM  Result Value Ref Range Status   MRSA by PCR NEGATIVE NEGATIVE Final     Scheduled Meds: . amLODipine  10 mg Oral Daily  . antiseptic oral rinse  7 mL Mouth Rinse BID  . doxazosin  1 mg Oral QHS  . heparin  5,000 Units Subcutaneous 3 times per day  . insulin aspart  0-9 Units Subcutaneous Q6H  . insulin aspart protamine- aspart  15 Units Subcutaneous BID WC  . metoprolol tartrate  25 mg Oral BID  . piperacillin-tazobactam (ZOSYN)  IV  2.25 g Intravenous Q6H  . vancomycin  1,000 mg Intravenous Q48H   Continuous Infusions:

## 2015-01-27 NOTE — Progress Notes (Signed)
MEDICAL ONCOLOGY January 27, 2015, 7:48 AM  Hospital day 3 Antibiotics: vanc zosyn Chemotherapy: none  Physicians:Paola Gehrig/ E.Verdon Cummins, Amy Johnette Abraham, MD, J.McComb, Kathlyn Sacramento, Erling Cruz, Jeneen Rinks Kinard  Appreciate Dr Doyle Askew letting me know of admission to ICU stepdown with acute hypoxic respiratory failure, in setting of other significant comorbid conditions. 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  EMR reviewed. RN initially in room with patient, no family here presently. For left thoracentesis today.  Subjective: Breathing much better since right thoracentesis of 1.4 liters on admission. Using O2 in hospital, some dry cough, no chest pain. Appetite also improved since the thoracentesis. Slept a little, bowels moved this AM. Denies other pain.     ONCOLOGIC HISTORY Patient has been followed closely for several years by PCP Dr Eliezer Lofts. She presented with ~ 6 weeks of vaginal spotting, with [place] on 08-22-14 showing endometrium 28 mm thick, with uterus 10.5 x 5.5 x 6.3 cm with 2 fibroids, and thick walled cystic lesion in left ovary measuring 13 mm.She had a repeat ultrasound done on April 23 which revealed the endometrial stripe thickening to be 75mm. There was a hypoechoic mass 7 x 3 cm seen within the endometrium into the cervix. 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 She was seen by Dr Alycia Rossetti, with CT AP in [facility] system 09-23-14 showing small retroperitoneal/ pelvic lymph nodes, enlarged uterus with heterogeneous thickening of endometrium, bilateral ovaries not remarkable, no ascites, also gallstone, degenerative arthritis and probably chronic compression fracture L1. Creatinine on 09-09-14 was 2.88, with recent priors ~ 2.5. She had cardiology clearance  prior to surgery. Surgery was done at [facility] 09-30-14, which was TAH/ BSO, pelvic lymph node evaluation and partial omentectomy for R0 resection by Dr Alycia Rossetti. 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 Primary tumor was 6.6 cm with 22 / 23 mm invasion, right tube, right ovary and left tube involved, omentum diffusely involved, + LVSI, 1 left pelvic node and 1 right pelvic node involved (left ovary not involved). Post operative course was not remarkable and she was discharged home on POD #3 (10-03-14). Labs from 10-03-14 had WBC 15, Hgb 10.4, plt 328, BUN 47 and creatinine 2.39. Case was presented at gyn oncology multidisciplinary conference at [facility] on 10-08-14, tho information in that summary did not include her renal function, with recommendation for adjuvant carboplatin and taxol, with plan to reimage after completion of chemotherapy, then possible pelvic radiation. At medical oncology consultation on 10-19-14 she had creatinine 4.2. She had consultation with Dr Sondra Come on 11-27-14 but decided against attempting pelvic radiation, this considered in attempt to prevent pelvic recurrence of the malignancy.  CT AP (without IV contrast) on 01-05-15 found new pleural effusions, questioned lesion in liver and small perihepatic fluid.  Objective: Vital signs in last 24 hours: Blood pressure 141/42, pulse 75, temperature 98.6 F (37 C), temperature source Oral, resp. rate 18, height 5' 8.5" (1.74 m), weight 180 lb 12.4 oz (82 kg), SpO2 97 %.   Intake/Output from previous day: 09/05 0701 - 09/06 0700 In: 640 [P.O.:240; IV Piggyback:400] Out: 675 [Urine:675] Intake/Output this shift:    Physical exam: awake, alert, looks comfortable seated on side of bed with [place] O2 on. Respirations not labored,  no cough now. PERRL, not icteric. Mucous membranes moist. No JVD upright. Scattered crackles RLL posteriorly and dull  to percussion/ absent BD left base, no wheezes, no use of accessory muscles. Heart RRR no gallop. Abdomen soft, some BS, not tender. LE no pitting edema, cords, tenderness. Moves all extremities easily, speech fluent and appropriate. Skin without rash or ecchymosis. Peripheral IV site ok.    Lab Results:  Recent Labs  01/26/15 0346 01/27/15 0345  WBC 17.9* 13.4*  HGB 8.5* 8.7*  HCT 28.3* 27.9*  PLT 404* 433*   BMET  Recent Labs  01/26/15 0346 01/27/15 0345  NA 143 144  K 4.8 4.4  CL 114* 113*  CO2 24 24  GLUCOSE 164* 103*  BUN 71* 74*  CREATININE 3.25* 3.20*  CALCIUM 8.3* 8.4*   Blood cx x2 from 01-25-15 no growth to date Pleural fluid culture negative thus far Cytology sent 9-4 from pleural fluid, pending.   Studies/Results: Dg Chest 1 View  01/25/2015   CLINICAL DATA:  Status post right thoracentesis.  EXAM: CHEST  1 VIEW  COMPARISON:  01/25/2015  FINDINGS: Heart is enlarged also accentuated by technique. Significant decreased size of right pleural effusion. There is elevation of the right hemidiaphragm. No pneumothorax. There is density in the medial left lung base consistent with infiltrate.  IMPRESSION: Significant decreased size of right pleural effusion. No pneumothorax. Next item persistent left lower lobe opacity consistent with infiltrate or significant atelectasis.   Electronically Signed   By: Nolon Nations M.D.   On: 01/25/2015 10:42   Nm Pulmonary Perf And Vent  01/25/2015   CLINICAL DATA:  Ongoing shortness of breath since 01/05/2015. Symptoms worse over the last week. Bilateral leg swelling. History of chronic kidney disease in treatment for pneumonia.  EXAM: NUCLEAR MEDICINE VENTILATION - PERFUSION LUNG SCAN  TECHNIQUE: Ventilation images were obtained in multiple projections using inhaled aerosol Tc-36m DTPA. Perfusion images were obtained in multiple projections after intravenous injection of Tc-40m MAA.  RADIOPHARMACEUTICALS:  42.1 Technetium-34m DTPA aerosol  inhalation and 6.2 Technetium-87m MAA IV  COMPARISON:  Chest x-ray 01/25/2015  FINDINGS: Ventilation: Mild patchy ventilation consistent with clumped particles.  Perfusion: No pleural-based wedge-shaped defects. Matched deficit at the right lung base consistent with large pleural effusion as seen on chest x-ray.  IMPRESSION: Low probability for clinically significant pulmonary embolus.   Electronically Signed   By: Nolon Nations M.D.   On: 01/25/2015 09:31   US Thoracentesis Asp Pleural Space W/img Guide  01/25/2015   INDICATION: Symptomatic right sided pleural effusion  EXAM: US THORACENTESIS ASP PLEURAL SPACE W/IMG GUIDE  COMPARISON:  Chest radiograph - 01/25/2015  MEDICATIONS: None  COMPLICATIONS: None immediate  TECHNIQUE: Informed written consent was obtained from the patient after a discussion of the risks, benefits and alternatives to treatment. A timeout was performed prior to the initiation of the procedure.  Initial ultrasound scanning demonstrates a moderate to large pleural effusion. The lower chest was prepped and draped in the usual sterile fashion. 1% lidocaine was used for local anesthesia.  An ultrasound image was saved for documentation purposes. An 8 Fr Safe-T-Centesis catheter was introduced. The thoracentesis was performed. The catheter was removed and a dressing was applied. The patient tolerated the procedure well without immediate post procedural complication. The patient was escorted to have an upright chest radiograph.  FINDINGS: A total of approximately 1.4 liters of serous, slightly blood tinged fluid was removed. Requested samples were sent to the laboratory.  IMPRESSION: Successful ultrasound-guided right  sided thoracentesis yielding 1.4 liters of pleural fluid.   Electronically Signed   By: Sandi Mariscal M.D.   On: 01/25/2015 15:22   LE venous dopplers 01-25-15 no evidence of DVT  Echocardiogram done 01-26-15, results pending  MEDICATIONS reviewed in EMR  Discussion: respiratory  symptoms better since right thoracentesis. She understands that tests are in process to determine etiology of the pleural fluid.   Assessment/Plan: 1.worsening respiratory status over several weeks PTA, then admission with acute hypoxic respiratory failure: symptoms better since right thoracentesis 1.4 liters on 01-25-15, studies pending on fluid. For left thoracentesis later today. Cultures pending.  2.IVB carcinosarcoma of uterus:post R0 debulking at [facility] 09-30-14 with path finding of extensive disease moreso than was visually apparent at surgery. Renal function too poor for usual adjuvant chemotherapy and patient additionally did not want treatment; she also declined local radiation in preventive attempt. CT AP 01-05-15 concerning for early progression, which has not yet been confirmed. Will follow up cytology information pending on pleural fluid.  3.Acute on chronic kidney disease: she was to have seen Dr Erling Cruz in late August. She has not wanted hemodialysis. 4.diabetes x 25 years on insulin, followed by PCP 5.long past tobacco 6.HTN : results of echocardiogram pending 7.degenerative arthritis 8.was given information re Advance Directives previously  I will follow up diagnostic information pending. Please page me between my rounds if needed pager [phone]  Gordy Levan MD

## 2015-01-28 LAB — GLUCOSE, CAPILLARY
GLUCOSE-CAPILLARY: 109 mg/dL — AB (ref 65–99)
GLUCOSE-CAPILLARY: 118 mg/dL — AB (ref 65–99)
Glucose-Capillary: 87 mg/dL (ref 65–99)
Glucose-Capillary: 99 mg/dL (ref 65–99)

## 2015-01-28 LAB — CBC
HCT: 29.5 % — ABNORMAL LOW (ref 36.0–46.0)
Hemoglobin: 9 g/dL — ABNORMAL LOW (ref 12.0–15.0)
MCH: 23.3 pg — AB (ref 26.0–34.0)
MCHC: 30.5 g/dL (ref 30.0–36.0)
MCV: 76.2 fL — AB (ref 78.0–100.0)
PLATELETS: 407 10*3/uL — AB (ref 150–400)
RBC: 3.87 MIL/uL (ref 3.87–5.11)
RDW: 18.6 % — AB (ref 11.5–15.5)
WBC: 13.1 10*3/uL — ABNORMAL HIGH (ref 4.0–10.5)

## 2015-01-28 LAB — PH, BODY FLUID: PH, BODY FLUID: 6.6

## 2015-01-28 LAB — BASIC METABOLIC PANEL
Anion gap: 7 (ref 5–15)
BUN: 70 mg/dL — AB (ref 6–20)
CALCIUM: 8.6 mg/dL — AB (ref 8.9–10.3)
CO2: 26 mmol/L (ref 22–32)
Chloride: 112 mmol/L — ABNORMAL HIGH (ref 101–111)
Creatinine, Ser: 3.56 mg/dL — ABNORMAL HIGH (ref 0.44–1.00)
GFR calc Af Amer: 13 mL/min — ABNORMAL LOW (ref >60)
GFR, EST NON AFRICAN AMERICAN: 11 mL/min — AB (ref >60)
GLUCOSE: 98 mg/dL (ref 65–99)
Potassium: 4.2 mmol/L (ref 3.5–5.1)
Sodium: 145 mmol/L (ref 135–145)

## 2015-01-28 MED ORDER — BRIMONIDINE TARTRATE 0.2 % OP SOLN
1.0000 [drp] | Freq: Every day | OPHTHALMIC | Status: DC
  Administered 2015-01-28 – 2015-01-29 (×2): 1 [drp] via OPHTHALMIC
  Filled 2015-01-28: qty 5

## 2015-01-28 MED ORDER — BRIMONIDINE TARTRATE-TIMOLOL 0.2-0.5 % OP SOLN: 1.0000 [drp] | Freq: Every day | OPHTHALMIC | Status: DC

## 2015-01-28 MED ORDER — DOXYCYCLINE HYCLATE 100 MG PO TABS
100.0000 mg | ORAL_TABLET | Freq: Two times a day (BID) | ORAL | Status: DC
  Administered 2015-01-28: 100 mg via ORAL
  Filled 2015-01-28: qty 1

## 2015-01-28 MED ORDER — AMOXICILLIN-POT CLAVULANATE 500-125 MG PO TABS
1.0000 | ORAL_TABLET | Freq: Two times a day (BID) | ORAL | Status: DC
  Administered 2015-01-28: 500 mg via ORAL
  Filled 2015-01-28 (×2): qty 1

## 2015-01-28 MED ORDER — TIMOLOL MALEATE 0.5 % OP SOLN
1.0000 [drp] | Freq: Every day | OPHTHALMIC | Status: DC
  Administered 2015-01-28 – 2015-01-29 (×2): 1 [drp] via OPHTHALMIC
  Filled 2015-01-28: qty 5

## 2015-01-28 NOTE — Consult Note (Signed)
Name: Doris [name] MRN: [id number] DOB: 04/13/1933    ADMISSION DATE:  01/25/2015 CONSULTATION DATE:  01/28/15  REFERRING MD :  Dr. Wynelle Cleveland   CHIEF COMPLAINT:  Shortness of breath / Pleural Effusions  BRIEF PATIENT DESCRIPTION:  81 y/o F with PMH of endometrial cancer on observation s/p debulking (not a candidate for chemo due to renal function), HTN, DM, CKD admitted on 9/4 with complaints of 5 days of worsening shortness of breath.  Found to have bilateral pleural effusions and concern for new liver lesion.  PCCM consulted for evaluation of effusions.   SIGNIFICANT EVENTS  9/04  Admit with SOB 9/04  R Thora >> 1.4 L fluid removed 9/06  L Thora >> 550 ml removed  STUDIES:  8/15  CT ABD / Pelvis >> new left hepatic lobe lesion concerning for metastasis, new bilateral pleural effusions, small amt free intraperitoneal free fluid 9/04  LE Duplex >> no evidence of DVT 9/04  VQ Scan >> low probability for PE   HISTORY OF PRESENT ILLNESS:  81 y/o F, former smoker,  with PMH of endometrial cancer on observation s/p debulking (not a candidate for chemo due to renal function), HTN, DM, CKD admitted on 9/4 with complaints of 5 days of worsening shortness of breath.    The patient reported she developed progressive shortness of breath approximately 5 days before admission.  She was previously seen by her Oncologist 8/16 for complaints of non-productive cough and increasing shortness of breath.  She was treated empirically for a possible pneumonia with one week levaquin which she stopped on 01/21/15 (took 5/6 tablets).  Unfortunately, she had no improvement in symptoms.  She followed up with Oncology on 9/2 with a repeat CXR that showed bilateral pleural effusions that had progressed since August evaluation.  CT 8/15 showed bilateral pleural effusions and new liver lesion concerning for metastatic disease.    At baseline, she lives at home and is independent of ADL's.  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    The patient presented to [facility] on 9/4 via EMS for increasing SOB.  The patient noted decrease in activity tolerance.  She denies fevers, chills, nausea, vomiting, chest pain, sputum production, pain with inspiration, swelling of lower extremities.  She was admitted per [facility] for further evaluation of dyspnea.  The patient underwent a right thoracentesis on 9/4 with 1.4 L of exudative fluid removed.  SHe subsequently had a left thoracentesis on 9/6 with 550 ml removed.  She was ruled out for thromboembolic disease with a lower extremity venous duplex and VQ scan which were negative.  Pleural fluid consistent with exudative properties by Light's Criteria.  PCCM consulted for further evaluation / assistance with effusion.      PAST MEDICAL HISTORY :   has a past medical history of Diabetes mellitus; Hyperlipidemia; Hypertension; Arthritis; Seizures; Enlarged heart; Endometrial cancer; and Pneumonia.  has past surgical history that includes Appendectomy; Spine surgery; Tonsillectomy; Total abdominal hysterectomy w/ bilateral salpingoophorectomy (09/30/14); and Omentectomy (09/30/14).   Prior to Admission medications   Medication Sig Start Date End Date Taking? Authorizing Provider  albuterol (PROVENTIL HFA;VENTOLIN HFA) 108 (90 BASE) MCG/ACT inhaler Inhale 1-2 puffs into the lungs every 6 (six) hours as needed for wheezing or shortness of breath. 01/23/15  Yes Susanne Borders, NP  amLODipine (NORVASC) 10 MG tablet Take 1 tablet (10 mg total) by mouth daily. 09/11/14  Yes Amy Cletis Athens, MD  atorvastatin (LIPITOR) 80 MG  tablet TAKE ONE TABLET BY MOUTH ONCE DAILY 04/11/14  Yes Amy E Bedsole, MD  COMBIGAN 0.2-0.5 % ophthalmic solution Place 1 drop into both eyes daily.  09/11/14  Yes Historical Provider, MD  doxazosin (CARDURA) 1 MG tablet Take 1 mg by mouth at bedtime.   Yes Historical Provider, MD  hydrochlorothiazide (HYDRODIURIL) 25 MG tablet TAKE ONE TABLET BY MOUTH EVERY DAY  09/11/14  Yes Amy Cletis Athens, MD  insulin NPH-regular Human (NOVOLIN 70/30) (70-30) 100 UNIT/ML injection Inject 20-25 Units into the skin 2 (two) times daily with a meal. Inject 25 units in AM and 20 UNits in PM with meals. 05/02/11  Yes Amy E Diona Browner, MD  metoprolol (TOPROL-XL) 200 MG 24 hr tablet TAKE ONE TABLET BY MOUTH ONCE DAILY 09/11/14  Yes Amy Cletis Athens, MD  Lancets MISC by Does not apply route.      Historical Provider, MD  ONE TOUCH ULTRA TEST test strip USE ONE TEST STRIP TO CHECK BLOOD SUGAR EVERY DAY Patient not taking: Reported on 01/25/2015 08/19/10   Jinny Sanders, MD   No Known Allergies  FAMILY HISTORY:  family history includes Heart attack in her father; Hypertension in her mother; Ovarian cancer (age of onset: 4) in her daughter.   SOCIAL HISTORY:  reports that she quit smoking about 4 years ago. Her smoking use included Cigarettes. She has a 30 pack-year smoking history. She has never used smokeless tobacco. She reports that she does not drink alcohol or use illicit drugs.  REVIEW OF SYSTEMS:   Constitutional: Negative for fever, chills, weight loss, malaise/fatigue and diaphoresis.  HENT: Negative for hearing loss, ear pain, nosebleeds, congestion, sore throat, neck pain, tinnitus and ear discharge.   Eyes: Negative for blurred vision, double vision, photophobia, pain, discharge and redness.  Respiratory: Negative for hemoptysis, sputum production, wheezing and stridor.  Reports shortness of breath, dry non-productive cough Cardiovascular: Negative for chest pain, palpitations, orthopnea, claudication, leg swelling and PND.  Gastrointestinal: Negative for heartburn, nausea, vomiting, abdominal pain, diarrhea, constipation, blood in stool and melena.  Genitourinary: Negative for dysuria, urgency, frequency, hematuria and flank pain.  Musculoskeletal: Negative for myalgias, back pain, joint pain and falls.  Skin: Negative for itching and rash.  Neurological: Negative for  dizziness, tingling, tremors, sensory change, speech change, focal weakness, seizures, loss of consciousness, weakness and headaches.  Endo/Heme/Allergies: Negative for environmental allergies and polydipsia. Does not bruise/bleed easily.  SUBJECTIVE:   VITAL SIGNS: Temp:  [97.6 F (36.4 C)-98.5 F (36.9 C)] 98.5 F (36.9 C) (09/07 0448) Pulse Rate:  [88-90] 90 (09/07 0448) Resp:  [18] 18 (09/07 0448) BP: (130-132)/(58-62) 130/58 mmHg (09/07 0448) SpO2:  [100 %] 100 % (09/07 0448) Weight:  [186 lb 11.7 oz (84.7 kg)] 186 lb 11.7 oz (84.7 kg) (09/07 1108)  PHYSICAL EXAMINATION: General:  Elderly female in NAD, well developed  Neuro:  AAOx4, speech clear, MAE  HEENT:  MM pink/moist, edentulous  Cardiovascular:  s1s2 rrr, no m/r/g  Lungs:  resp's even/non-labored on RA, lungs bilaterally diminished lower posterior  Abdomen:  Obese/soft, bsx4 active  Musculoskeletal:  No acute deformities  Skin:  Warm/dry, no rashes or lesions.    Recent Labs Lab 01/26/15 0346 01/27/15 0345 01/28/15 0525  NA 143 144 145  K 4.8 4.4 4.2  CL 114* 113* 112*  CO2 24 24 26   BUN 71* 74* 70*  CREATININE 3.25* 3.20* 3.56*  GLUCOSE 164* 103* 98    Recent Labs Lab 01/26/15 0346 01/27/15 0345 01/28/15  0525  HGB 8.5* 8.7* 9.0*  HCT 28.3* 27.9* 29.5*  WBC 17.9* 13.4* 13.1*  PLT 404* 433* 407*   Dg Chest 1 View  01/27/2015   CLINICAL DATA:  Status post left thoracentesis.  EXAM: CHEST  1 VIEW  COMPARISON:  01/25/2015 chest radiograph  FINDINGS: Stable cardiomediastinal silhouette, with top-normal heart size. No pneumothorax. Trace right pleural effusion, increased. No residual left pleural effusion. No pulmonary edema. Mild curvilinear bibasilar lung opacities, increased bilaterally.  IMPRESSION: 1. No pneumothorax. 2. Increased trace right pleural effusion. 3. Increased mild bibasilar lung opacities, likely atelectasis.   Electronically Signed   By: Ilona Sorrel M.D.   On: 01/27/2015 12:08   US  Thoracentesis Asp Pleural Space W/img Guide  01/27/2015   INDICATION: Symptomatic left sided pleural effusion  EXAM: US THORACENTESIS ASP PLEURAL SPACE W/IMG GUIDE  COMPARISON:  Right Thoracentesis 01/25/15.  MEDICATIONS: None  COMPLICATIONS: None immediate  TECHNIQUE: Informed written consent was obtained from the patient after a discussion of the risks, benefits and alternatives to treatment. A timeout was performed prior to the initiation of the procedure.  Initial ultrasound scanning demonstrates a left pleural effusion. The lower chest was prepped and draped in the usual sterile fashion. 1% lidocaine was used for local anesthesia.  Under direct ultrasound guidance, a 19 gauge, 7-cm, Yueh catheter was introduced. An ultrasound image was saved for documentation purposes. The thoracentesis was performed. The catheter was removed and a dressing was applied. The patient tolerated the procedure well without immediate post procedural complication. The patient was escorted to have an upright chest radiograph.  FINDINGS: A total of approximately 550 ml of serous fluid was removed.  IMPRESSION: Successful ultrasound-guided left sided thoracentesis yielding 550 ml of pleural fluid.  Read By:  Tsosie Billing PA-C   Electronically Signed   By: Aletta Edouard M.D.   On: 01/27/2015 11:31    ASSESSMENT / PLAN:   Bilateral Pleural Effusions  Dyspnea  Metastatic Endometrial Cancer   Discussion:  81 y/o F with PMH of endometrial cancer s/p debulking on observation as renal function would not allow for chemotherapy who was admitted 9/4 for worsening dyspnea.  She previously had been treated with levaquin for a possible pneumonia without improvement. Recent CT showed new bilateral pleural effusions and new liver lesion concerning for metastatic disease.   Work up this admission notable for bilateral pleural effusions R>L and patient has had thoracentesis with 9/4 pleural fluid that was lymphocytic predominant fluid /  exudative.  Pleural fluid reviewed with Pathology and it was positive for malignant cells (final pathology pending).    Plan: The patient does not want to undergo chemotherapy & she is not a candidate based on renal function  Consider Palliative Care involvement if patient desires.  She seems to have a very good understanding of her diagnosis and what she would / would not want in regards to her care PleurX catheter placement may be appropriate depending on patients goals of care and fluid re-accumulation rate Follow pleural cultures / fluid  Intermittent CXR to follow pleural fluid    PCCM will be available PRN.  Please call if new needs/questions arise.   Noe Gens, NP-C [facility] Pgr: [phone] or if no answer [phone] 01/28/2015, 12:11 PM

## 2015-01-28 NOTE — Progress Notes (Signed)
[facility] Progress Note   Doris [name]  FBP:[id number]  DOB: 1932/06/07  DOA: 01/25/2015 PCP: Eliezer Lofts, MD  Brief narrative: Doris [name] is a 81 y.o. female past medical history of endometrial cancer status post resection on observation,, hypertension, diabetes mellitus, chronic kidney disease. Presents on 9/4 for shortness of breath which was progressive despite completing 5 days of Levaquin for right-sided pneumonia by her oncologist. She also was noted to have swelling of both lower extremities. Chest x-ray obtained in the ER revealed a moderate to large right-sided pleural effusion with right basilar airspace opacity and mild left basilar airspace opacity. On an x-ray performed on 8/18, she was noted to have a right lower lobe infiltrate and a small right-sided effusion. She was admitted and started on vancomycin and Zosyn as she was considered to have failed Levaquin treatment. The patient states that despite taking the Levaquin for shortness of breath continued to get worse.    Subjective: Feels well and has no complaints of shortness of breath. States that breathing is better after the thoracentesis that was done yesterday. Has no complaints of cough.  Assessment/Plan: Principal Problem: Acute hypoxic respiratory failure  (A)  Pleural effusions- bilateral -9/4/underwent right-sided thoracentesis 1.4 L fluid consistent with exudate with 1159 WBCs 50% lymphs, 31% neutrophils-culture negative so far however, Cytology reveals malignancy- as mentioned below she has a history of endometrial cancer for which she did not receive chemotherapy due to poor renal function  - 9/6-left-sided thoracentesis 500 mL of fluid-fluid not sent to lab - Patient does not want any treatment for cancer and therefore I have consulted palliative care -I have asked for a pulmonary eval-they are doubtful that she had a pneumonia-in agreement that we should stop antibiotics -Most likely effusion will  recur- have asked pulmonary if we should place a Pleurx now- recommended to follow and decide on a Pleurx of effusions recur  (B) Right lower lobe pneumonia?? -Strep and legionella antigens negative -Received 5 days of Levaquin (about 2 weeks prior to admission) as outpatient followed by Vanco and Zosyn beginning on 9/4- above mentioned malignant effusions likely the underlying cause of her respiratory failure-pulmonary in agreement- we'll stop antibiotics  (C) acute on chronic diastolic heart failure? -Echo reveals normal systolic function and grade 1 diastolic dysfunction -Doubtful the effusions are from heart failure -Has been receiving IV Lasix intermittently- we'll hold Lasix    Endometrial cancer -Debulking at [facility] on 09/30/14 -Renal function was too poor for adjuvant chemotherapy which the patient did not want as well, she declined local radiation    Anemia of chronic renal failure, stage 4 (severe) -Baseline creatinine is about 2.8 -Hold Lasix    Essential hypertension, benign -Norvasc, doxazosin and metoprolol    Type 2 diabetes mellitus with diabetic chronic kidney disease -NovoLog 70/30- dosage lower than home dose-sugars controlled on this  Anemia of chronic disease, iron deficiency anemia -Follow hemoglobin   Code Status:     Code Status Orders        Start     Ordered   01/25/15 0444  Full code   Continuous     01/25/15 0446     Family Communication:  Disposition Plan: Possibly home with hospice DVT prophylaxis: Heparin Consultants: Oncology, pulmonary, palliative Procedures: 01/26/15 2-D echo 9/4 Right-sided thoracentesis 9/6 left-sided thoracentesis Appt with PCP: Requested Antibiotics: Anti-infectives    Start     Dose/Rate Route Frequency Ordered Stop   01/30/15 1000  levofloxacin (LEVAQUIN) tablet 500 mg  500 mg Oral Every 48 hours 01/27/15 1906     01/28/15 1000  levofloxacin (LEVAQUIN) tablet 500 mg  Status:  Discontinued     500 mg Oral  Daily 01/27/15 1848 01/27/15 1904   01/28/15 1000  levofloxacin (LEVAQUIN) tablet 750 mg     750 mg Oral  Once 01/27/15 1906 01/28/15 1041   01/27/15 0400  vancomycin (VANCOCIN) IVPB 1000 mg/200 mL premix  Status:  Discontinued     1,000 mg 200 mL/hr over 60 Minutes Intravenous Every 48 hours 01/26/15 1242 01/27/15 1848   01/26/15 2200  vancomycin (VANCOCIN) IVPB 1000 mg/200 mL premix  Status:  Discontinued     1,000 mg 200 mL/hr over 60 Minutes Intravenous Every 48 hours 01/25/15 0511 01/26/15 1242   01/25/15 0900  piperacillin-tazobactam (ZOSYN) IVPB 2.25 g  Status:  Discontinued     2.25 g 100 mL/hr over 30 Minutes Intravenous Every 6 hours 01/25/15 0238 01/27/15 1848   01/25/15 0600  piperacillin-tazobactam (ZOSYN) IVPB 3.375 g  Status:  Discontinued     3.375 g 100 mL/hr over 30 Minutes Intravenous 3 times per day 01/25/15 0225 01/25/15 0235   01/25/15 0245  vancomycin (VANCOCIN) IVPB 1000 mg/200 mL premix     1,000 mg 200 mL/hr over 60 Minutes Intravenous  Once 01/25/15 0235 01/25/15 0443   01/25/15 0245  piperacillin-tazobactam (ZOSYN) IVPB 3.375 g     3.375 g 100 mL/hr over 30 Minutes Intravenous  Once 01/25/15 0235 01/25/15 0443      Objective: Filed Weights   01/25/15 0226 01/25/15 1326 01/26/15 0344  Weight: 82.6 kg (182 lb 1.6 oz) 82 kg (180 lb 12.4 oz) 82 kg (180 lb 12.4 oz)   No intake or output data in the 24 hours ending 01/28/15 1055   Vitals Filed Vitals:   01/27/15 1120 01/27/15 1158 01/27/15 2118 01/28/15 0448  BP: 109/62 143/59 132/62 130/58  Pulse:  77 88 90  Temp:  98.1 F (36.7 C) 97.6 F (36.4 C) 98.5 F (36.9 C)  TempSrc:  Oral Oral Tympanic  Resp:  17 18 18   Height:      Weight:      SpO2:  96% 100% 100%    Exam:  General:  Pt is alert, not in acute distress  HEENT: No icterus, No thrush, oral mucosa moist  Cardiovascular: regular rate and rhythm, S1/S2 No murmur  Respiratory: clear to auscultation bilaterally   Abdomen: Soft, +Bowel  sounds, non tender, non distended, no guarding  MSK: No LE edema, cyanosis or clubbing  Data Reviewed: Basic Metabolic Panel:  Recent Labs Lab 01/25/15 0104 01/25/15 0535 01/26/15 0346 01/27/15 0345 01/28/15 0525  NA 145 141 143 144 145  K 4.8 5.0 4.8 4.4 4.2  CL 113* 111 114* 113* 112*  CO2 24 20* 24 24 26   GLUCOSE 187* 211* 164* 103* 98  BUN 73* 72* 71* 74* 70*  CREATININE 3.57* 3.50* 3.25* 3.20* 3.56*  CALCIUM 8.9 8.7* 8.3* 8.4* 8.6*   Liver Function Tests:  Recent Labs Lab 01/23/15 1317 01/25/15 0104 01/25/15 0535  AST <7 8* 8*  ALT 8 9* 11*  ALKPHOS 90 81 80  BILITOT 0.33 0.5 0.2*  PROT 7.2 7.5 7.4  ALBUMIN 3.4* 3.7 3.6   No results for input(s): LIPASE, AMYLASE in the last 168 hours. No results for input(s): AMMONIA in the last 168 hours. CBC:  Recent Labs Lab 01/23/15 1317 01/25/15 0104 01/25/15 0535 01/26/15 0346 01/27/15 0345 01/28/15 0525  WBC 12.9*  15.5* 16.1* 17.9* 13.4* 13.1*  NEUTROABS 10.9*  --  15.3*  --   --   --   HGB 9.6* 9.9* 9.5* 8.5* 8.7* 9.0*  HCT 31.3* 32.2* 30.8* 28.3* 27.9* 29.5*  MCV 74.5* 75.9* 76.2* 75.9* 76.0* 76.2*  PLT 456* 496* 443* 404* 433* 407*   Cardiac Enzymes: No results for input(s): CKTOTAL, CKMB, CKMBINDEX, TROPONINI in the last 168 hours. BNP (last 3 results)  Recent Labs  01/25/15 0536  BNP 268.0*    ProBNP (last 3 results) No results for input(s): PROBNP in the last 8760 hours.  CBG:  Recent Labs Lab 01/27/15 1202 01/27/15 1228 01/27/15 1250 01/27/15 1737 01/28/15 0005  GLUCAP 64* 67 95 131* 87    Recent Results (from the past 240 hour(s))  Culture, blood (x 2)     Status: None (Preliminary result)   Collection Time: 01/25/15  5:36 AM  Result Value Ref Range Status   Specimen Description BLOOD RIGHT WRIST  Final   Special Requests BOTTLES DRAWN AEROBIC AND ANAEROBIC 6CC  Final   Culture   Final    NO GROWTH 2 DAYS Performed at [facility]    Report Status PENDING  Incomplete   Culture, blood (x 2)     Status: None (Preliminary result)   Collection Time: 01/25/15  5:40 AM  Result Value Ref Range Status   Specimen Description BLOOD BLOOD LEFT FOREARM  Final   Special Requests BOTTLES DRAWN AEROBIC AND ANAEROBIC 5ML  Final   Culture   Final    NO GROWTH 2 DAYS Performed at [facility]    Report Status PENDING  Incomplete  Body fluid culture     Status: None (Preliminary result)   Collection Time: 01/25/15 10:31 AM  Result Value Ref Range Status   Specimen Description PLEURAL RIGHT  Final   Special Requests NONE  Final   Gram Stain   Final    MODERATE WBC PRESENT, PREDOMINANTLY MONONUCLEAR NO ORGANISMS SEEN GRAM STAIN REVIEWED-AGREE WITH RESULT K WOOLEN    Culture   Final    NO GROWTH 2 DAYS Performed at [facility]    Report Status PENDING  Incomplete  MRSA PCR Screening     Status: None   Collection Time: 01/25/15  2:40 PM  Result Value Ref Range Status   MRSA by PCR NEGATIVE NEGATIVE Final    Comment:        The GeneXpert MRSA Assay (FDA approved for NASAL specimens only), is one component of a comprehensive MRSA colonization surveillance program. It is not intended to diagnose MRSA infection nor to guide or monitor treatment for MRSA infections.      Studies: Dg Chest 1 View  01/27/2015   CLINICAL DATA:  Status post left thoracentesis.  EXAM: CHEST  1 VIEW  COMPARISON:  01/25/2015 chest radiograph  FINDINGS: Stable cardiomediastinal silhouette, with top-normal heart size. No pneumothorax. Trace right pleural effusion, increased. No residual left pleural effusion. No pulmonary edema. Mild curvilinear bibasilar lung opacities, increased bilaterally.  IMPRESSION: 1. No pneumothorax. 2. Increased trace right pleural effusion. 3. Increased mild bibasilar lung opacities, likely atelectasis.   Electronically Signed   By: Ilona Sorrel M.D.   On: 01/27/2015 12:08   US Thoracentesis Asp Pleural Space W/img Guide  01/27/2015    INDICATION: Symptomatic left sided pleural effusion  EXAM: US THORACENTESIS ASP PLEURAL SPACE W/IMG GUIDE  COMPARISON:  Right Thoracentesis 01/25/15.  MEDICATIONS: None  COMPLICATIONS: None immediate  TECHNIQUE: Informed written  consent was obtained from the patient after a discussion of the risks, benefits and alternatives to treatment. A timeout was performed prior to the initiation of the procedure.  Initial ultrasound scanning demonstrates a left pleural effusion. The lower chest was prepped and draped in the usual sterile fashion. 1% lidocaine was used for local anesthesia.  Under direct ultrasound guidance, a 19 gauge, 7-cm, Yueh catheter was introduced. An ultrasound image was saved for documentation purposes. The thoracentesis was performed. The catheter was removed and a dressing was applied. The patient tolerated the procedure well without immediate post procedural complication. The patient was escorted to have an upright chest radiograph.  FINDINGS: A total of approximately 550 ml of serous fluid was removed.  IMPRESSION: Successful ultrasound-guided left sided thoracentesis yielding 550 ml of pleural fluid.  Read By:  Tsosie Billing PA-C   Electronically Signed   By: Aletta Edouard M.D.   On: 01/27/2015 11:31    Scheduled Meds:  Scheduled Meds: . amLODipine  10 mg Oral Daily  . antiseptic oral rinse  7 mL Mouth Rinse BID  . doxazosin  1 mg Oral QHS  . furosemide  20 mg Intravenous Daily  . heparin  5,000 Units Subcutaneous 3 times per day  . insulin aspart  0-9 Units Subcutaneous Q6H  . insulin aspart protamine- aspart  15 Units Subcutaneous BID WC  . [START ON 01/30/2015] levofloxacin  500 mg Oral Q48H  . metoprolol tartrate  25 mg Oral BID   Continuous Infusions:   Time spent on care of this patient: 40 min   Maury, MD 01/28/2015, 10:55 AM  LOS: 3 days   [facility] Office  [phone] Pager - Text Page per [email or web address] If 7PM-7AM, please contact night-coverage  [email or web address]

## 2015-01-28 NOTE — Progress Notes (Signed)
Brief Pharmacy antibiotic note: Pharmacy consulted to dose augmentin for renal function Scr 3.5, CrCl ~14.51mls/min  Give augmentin 500mg  po q12h  Dolly Rias RPh 01/28/2015, 11:37 AM Pager [phone]

## 2015-01-28 NOTE — Care Management Important Message (Signed)
Important Message  Patient Details  Name: Doris [name] MRN: [id number] Date of Birth: May 30, 1932   Medicare Important Message Given:  Yes-second notification given    Camillo Flaming 01/28/2015, 12:17 [place] Message  Patient Details  Name: Doris [name] MRN: [id number] Date of Birth: 05-24-1932   Medicare Important Message Given:  Yes-second notification given    Camillo Flaming 01/28/2015, 12:16 PM

## 2015-01-28 NOTE — Evaluation (Addendum)
Physical Therapy Evaluation Patient Details Name: Doris [name] MRN: [id number] DOB: 05/27/1932 Today's Date: 01/28/2015   History of Present Illness  81 y/o F with PMH of endometrial cancer , HTN, DM, CKD admitted on 9/4 with complaints of 5 days of worsening shortness of breath. Found to have bilateral pleural effusions and concern for new liver lesion. PCCM consulted for evaluation of effusions. S/P IR for thoracentesis bilaterally.  Clinical Impression  Patient unsteady without UE support, improved with RW. Patient will benefit from PT to asddress problems listed in note below. Sats on RA to ambulate x    Follow Up Recommendations Home health PT;Supervision/Assistance - 24 hour    Equipment Recommendations  Rolling walker with 5" wheels    Recommendations for Other Services       Precautions / Restrictions Precautions Precaution Comments: monitor sats      Mobility  Bed Mobility                  Transfers Overall transfer level: Needs assistance   Transfers: Sit to/from Stand Sit to Stand: Min assist         General transfer comment: 2 truies from low surface, raised bed and stood, better with arm rests.  Ambulation/Gait Ambulation/Gait assistance: Min assist Ambulation Distance (Feet): 25 Feet Assistive device: None Gait Pattern/deviations: Step-through pattern;Staggering left;Staggering right  Then provided RW and ambulated x 75 feet with improved gait.     General Gait Details: reaching for wall and counter, door, provided with a  RW and improved safety.  Stairs            Wheelchair Mobility    Modified Rankin (Stroke Patients Only)       Balance Overall balance assessment: Needs assistance         Standing balance support: During functional activity;No upper extremity supported Standing balance-Leahy Scale: Poor                               Pertinent Vitals/Pain Pain Assessment: No/denies pain    Home  Living Family/patient expects to be discharged to:: Private residence Living Arrangements: Children Available Help at Discharge: Family Type of Home: House Home Access: Stairs to enter Entrance Stairs-Rails: Right Entrance Stairs-Number of Steps: 3 Home Layout: One level Home Equipment: Cane - single point      Prior Function Level of Independence: Independent               Hand Dominance        Extremity/Trunk Assessment   Upper Extremity Assessment: Generalized weakness           Lower Extremity Assessment: Generalized weakness      Cervical / Trunk Assessment: Normal  Communication   Communication: No difficulties  Cognition Arousal/Alertness: Awake/alert Behavior During Therapy: WFL for tasks assessed/performed Overall Cognitive Status: Within Functional Limits for tasks assessed       Memory: Decreased recall of precautions              General Comments      Exercises        Assessment/Plan    PT Assessment Patient needs continued PT services  PT Diagnosis Generalized weakness;Difficulty walking   PT Problem List Decreased strength;Decreased activity tolerance;Decreased mobility;Decreased safety awareness  PT Treatment Interventions DME instruction;Gait training;Functional mobility training;Therapeutic activities;Therapeutic exercise;Patient/family education   PT Goals (Current goals can be found in the Care Plan section) Acute Rehab PT Goals Patient Stated  Goal: i want to go home. Don't want a walker PT Goal Formulation: With patient Time For Goal Achievement: 02/11/15 Potential to Achieve Goals: Good    Frequency Min 3X/week   Barriers to discharge        Co-evaluation               End of Session   Activity Tolerance: Patient tolerated treatment well Patient left: in chair;with call bell/phone within reach;with chair alarm set Nurse Communication: Mobility status         Time: 5409-8119 PT Time Calculation  (min) (ACUTE ONLY): 15 min   Charges:   PT Evaluation $Initial PT Evaluation Tier I: 1 Procedure     PT G CodesClaretha Cooper 01/28/2015, 4:11 PM

## 2015-01-29 ENCOUNTER — Telehealth: Payer: Self-pay | Admitting: Oncology

## 2015-01-29 ENCOUNTER — Other Ambulatory Visit: Payer: Self-pay | Admitting: Oncology

## 2015-01-29 DIAGNOSIS — R0600 Dyspnea, unspecified: Secondary | ICD-10-CM

## 2015-01-29 DIAGNOSIS — C541 Malignant neoplasm of endometrium: Principal | ICD-10-CM

## 2015-01-29 DIAGNOSIS — J9601 Acute respiratory failure with hypoxia: Secondary | ICD-10-CM

## 2015-01-29 DIAGNOSIS — J910 Malignant pleural effusion: Secondary | ICD-10-CM

## 2015-01-29 DIAGNOSIS — Z515 Encounter for palliative care: Secondary | ICD-10-CM | POA: Insufficient documentation

## 2015-01-29 DIAGNOSIS — J90 Pleural effusion, not elsewhere classified: Secondary | ICD-10-CM

## 2015-01-29 LAB — BODY FLUID CULTURE: Culture: NO GROWTH

## 2015-01-29 LAB — GLUCOSE, CAPILLARY
GLUCOSE-CAPILLARY: 111 mg/dL — AB (ref 65–99)
GLUCOSE-CAPILLARY: 114 mg/dL — AB (ref 65–99)
Glucose-Capillary: 78 mg/dL (ref 65–99)

## 2015-01-29 NOTE — Telephone Encounter (Signed)
Appointments made per pof and per pof patient will get upon d/c from hospital

## 2015-01-29 NOTE — Progress Notes (Signed)
January 29, 2015, 8:24 AM  Hospital day 5 Antibiotics: DCd 01-28-15 Chemotherapy: none  Outpatient Physicians:Paola Gehrig/ E.Verdon Cummins, [place], [place], J.McComb, [address] [place], James Kinard, [place]  EMR reviewed. Discussed with hospitalist MD on unit. Patient seen, daughter in room. Cytology of pleural fluid shows malignant cells.  Subjective: Breathing is comfortable, using O2 now but has had it off at times. Minimal discomfort during bilateral thoracenteses, none now. No cough, no chest pain now. Appetite better since breathing is better. Bowels had moved "like water" but only x1 yesterday and none today. Voiding easily. Walked in hall yesterday   ONCOLOGIC HISTORY Patient has been followed closely for several years by PCP Dr Eliezer Lofts. She presented with ~ 6 weeks of vaginal spotting, with [place] on 08-22-14 showing endometrium 28 mm thick, with uterus 10.5 x 5.5 x 6.3 cm with 2 fibroids, and thick walled cystic lesion in left ovary measuring 13 mm.She had a repeat ultrasound done on April 23 which revealed the endometrial stripe thickening to be 89mm. There was a hypoechoic mass 7 x 3 cm seen within the endometrium into the cervix. 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 She was seen by Dr Alycia Rossetti, with CT AP in [facility] system 09-23-14 showing small retroperitoneal/ pelvic lymph nodes, enlarged uterus with heterogeneous thickening of endometrium, bilateral ovaries not remarkable, no ascites, also gallstone, degenerative arthritis and probably chronic compression fracture L1. Creatinine on 09-09-14 was 2.88, with recent priors ~ 2.5. She had cardiology clearance prior to surgery. Surgery was done at [facility] 09-30-14, which was TAH/ BSO, pelvic lymph node evaluation and partial omentectomy for R0 resection by Dr Alycia Rossetti. 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 Primary tumor was 6.6 cm with 22 / 23 mm invasion, right tube, right ovary and left tube involved, omentum diffusely involved, + LVSI, 1 left pelvic node and 1 right pelvic node involved (left ovary not involved). Post operative course was not remarkable and she was discharged home on POD #3 (10-03-14). Labs from 10-03-14 had WBC 15, Hgb 10.4, plt 328, BUN 47 and creatinine 2.39. Case was presented at gyn oncology multidisciplinary conference at [facility] on 10-08-14, tho information in that summary did not include her renal function, with recommendation for adjuvant carboplatin and taxol, with plan to reimage after completion of chemotherapy, then possible pelvic radiation. At medical oncology consultation on 10-19-14 she had creatinine 4.2. She had consultation with Dr Sondra Come on 11-27-14 but decided against attempting pelvic radiation, this considered in attempt to prevent pelvic recurrence of the malignancy.  CT AP (without IV contrast) on 01-05-15 found new pleural effusions, questioned lesion in liver and small perihepatic fluid. Patient was seen outpatient 01-22-15 with increased SOB and increased pleural effusions, then admitted 01-25-15 prior to planned outpatient thoracentesis. Cytology from right pleural fluid found malignant cells consistent with metastatic carcinoma.     Objective: Vital signs in last 24 hours: Blood pressure 143/65, pulse 83, temperature 97.4 F (36.3 C), temperature source Oral, resp. rate 18, height 5' 8.5" (1.74 m), weight 182 lb 12.2 oz (82.9 kg), SpO2 100 %.    Physical exam: awake, alert, looks comfortable lying supine with [place] O2. Able to reposition in bed without assistance. PERRL. No JVD, no supraclavicular adenopathy. Lungs with decreased BS just in bases bilaterally, otherwise clear to A and P, respirations not labored. Heart  RRR. Abdomen soft, not obviously distended, few BS, not tender. LE trace  pedal edema, no cords or tenderness. Moves al extremities easily. No IVF, NSL in, site ok.  Lab Results:  Recent Labs  01/27/15 0345 01/28/15 0525  WBC 13.4* 13.1*  HGB 8.7* 9.0*  HCT 27.9* 29.5*  PLT 433* 407*   BMET  Recent Labs  01/27/15 0345 01/28/15 0525  NA 144 145  K 4.4 4.2  CL 113* 112*  CO2 24 26  GLUCOSE 103* 98  BUN 74* 70*  CREATININE 3.20* 3.56*  CALCIUM 8.4* 8.6*    Studies/Results: Dg Chest 1 View  01/27/2015   CLINICAL DATA:  Status post left thoracentesis.  EXAM: CHEST  1 VIEW  COMPARISON:  01/25/2015 chest radiograph  FINDINGS: Stable cardiomediastinal silhouette, with top-normal heart size. No pneumothorax. Trace right pleural effusion, increased. No residual left pleural effusion. No pulmonary edema. Mild curvilinear bibasilar lung opacities, increased bilaterally.  IMPRESSION: 1. No pneumothorax. 2. Increased trace right pleural effusion. 3. Increased mild bibasilar lung opacities, likely atelectasis.   Electronically Signed   By: Ilona Sorrel M.D.   On: 01/27/2015 12:08   US Thoracentesis Asp Pleural Space W/img Guide  01/27/2015   INDICATION: Symptomatic left sided pleural effusion  EXAM: US THORACENTESIS ASP PLEURAL SPACE W/IMG GUIDE  COMPARISON:  Right Thoracentesis 01/25/15.  MEDICATIONS: None  COMPLICATIONS: None immediate  TECHNIQUE: Informed written consent was obtained from the patient after a discussion of the risks, benefits and alternatives to treatment. A timeout was performed prior to the initiation of the procedure.  Initial ultrasound scanning demonstrates a left pleural effusion. The lower chest was prepped and draped in the usual sterile fashion. 1% lidocaine was used for local anesthesia.  Under direct ultrasound guidance, a 19 gauge, 7-cm, Yueh catheter was introduced. An ultrasound image was saved for documentation purposes. The thoracentesis was performed. The catheter was removed and a dressing was applied. The patient tolerated the procedure  well without immediate post procedural complication. The patient was escorted to have an upright chest radiograph.  FINDINGS: A total of approximately 550 ml of serous fluid was removed.  IMPRESSION: Successful ultrasound-guided left sided thoracentesis yielding 550 ml of pleural fluid.  Read By:  Tsosie Billing PA-C   Electronically Signed   By: Aletta Edouard M.D.   On: 01/27/2015 11:31   CYTOLOGY  KYOMI, HECTOR Collected: 01/25/2015 Client: [facility] Accession: [id number] Received: 01/27/2015 John "Ronny Bacon, MDPORT Adequacy Reason Satisfactory For Evaluation. Diagnosis PLEURAL FLUID, RIGHT (SPECIMEN 1 OF 1 COLLECTED 01/25/15): MALIGNANT CELLS CONSISTENT WITH METASTATIC CARCINOMA.  ECJPCARDIOGRAM  01-26-15   EF 55-60%, mild aortic regurgitation   DISCUSSION: information from pleural fluid cytology reviewed with patient, with daughter present. We have discussed fact that treatment, whether chemotherapy or strict comfort measures, will be in palliative attempt. She has multiple very significant comorbid conditions which limit choices for chemotherapy and certainly put her at higher risk for complications and side effects of treatment. Patient, as previously, does not want chemotherapy. She is very open to Hospice assistance, understands that goal will be to keep her feeling as well as possible out of hospital, for as long as possible. We have discussed occasional thoracenteses by IR if breathing is compromised by gradually recurring malignant pleural effusions, vs pleurex catheter(s) for management at home if the malignant effusions recur rapidly. I have told her to let MD know if she becomes more SOB prior to scheduled office follow up visits. She has scheduled visit  with PCP on 02-08-15; I will see her back at [facility] after that (tho can change that visit depending on needs from patient / Hospice closer to that date).   Patient given opportunity to ask all of her questions, which I  have addressed. Daughter had no further questions at completion of visit.   Medications reviewed in EMR.  Assessment/Plan: 1. IVB carcinosarcoma of uterus:post R0 debulking at [facility] 09-30-14 with path finding of extensive disease moreso than was visually apparent at surgery. Renal function too poor for usual adjuvant chemotherapy and patient additionally did not want treatment; she also declined local radiation in preventive attempt. CT AP 01-05-15 concerning for early progression; now documented malignant pleural effusions. Agree wth DC home with Hospice. Can do prn thoracenteses vs pleurex outpatient as appropriate depending on how rapidly and how much the pleural effusions reaccumulate. She already has apt with Dr Diona Browner on 9-18, and my schedulers will set up visit at [facility] after that.  3.Acute on chronic kidney disease: she was to have seen Dr Erling Cruz in late August. She has not wanted hemodialysis. 4.diabetes x 25 years on insulin, followed by PCP 5.long past tobacco 6.HTN : echocardiogram with good EF 7.degenerative arthritis 8.was given information re Advance Directives previously   Please let me know if I can assist prior to DC. Cc this note Dr Diona Browner and I will let gyn oncology know  Thank you  LIVESAY,LENNIS P Pager [phone]

## 2015-01-29 NOTE — Discharge Summary (Signed)
Physician Discharge Summary  Virlee Stroschein GXQ:[id number] DOB: Jun 25, 1932 DOA: 01/25/2015  PCP: Eliezer Lofts, MD  Admit date: 01/25/2015 Discharge date: 01/29/2015  Time spent: 60 minutes  Recommendations for Outpatient Follow-up:  1. With her new diagnosis of malignant pleural effusions and short life expectancy, Dr Diona Browner to decide on which home medications can be stopped  2. Will go home with hospice  Discharge Condition: Stable  Discharge Diagnoses:  Principal Problem:   Pleural effusion Active Problems:   Hyperlipidemia LDL goal <100   Anemia of chronic renal failure, stage 4 (severe)   Essential hypertension, benign   Endometrial cancer   Type 2 diabetes mellitus with diabetic chronic kidney disease   Acute respiratory failure with hypoxia   HCAP (healthcare-associated pneumonia)   Dyspnea   Encounter for palliative care   History of present illness:  Doris [name] is a 81 y.o. female past medical history of endometrial cancer status post resection on observation,, hypertension, diabetes mellitus, chronic kidney disease. Presents on 9/4 for shortness of breath which was progressive despite completing 5 days of Levaquin for right-sided pneumonia by her oncologist. She also was noted to have swelling of both lower extremities. Chest x-ray obtained in the ER revealed a moderate to large right-sided pleural effusion with right basilar airspace opacity and mild left basilar airspace opacity. On an x-ray performed on 8/18, she was noted to have a right lower lobe infiltrate and a small right-sided effusion. She was admitted and started on vancomycin and Zosyn as she was considered to have failed Levaquin treatment. The patient states that despite taking the Levaquin for shortness of breath continued to get worse.  Hospital Course:  Principal Problem: Acute hypoxic respiratory failure (A) Pleural effusions- bilateral -9/4/underwent right-sided thoracentesis 1.4 L fluid consistent  with exudate with 1159 WBCs 50% lymphs, 31% neutrophils-culture negative so far however, Cytology reveals malignancy- as mentioned below she has a history of endometrial cancer for which she did not receive chemotherapy due to poor renal function  - 9/6-left-sided thoracentesis 500 mL of fluid-fluid not sent to lab -I have asked for a pulmonary eval-they are doubtful that she had a pneumonia-in agreement that we should stop antibiotics -Most likely effusion will recur- have asked pulmonary if we should place a Pleurex now- recommended to follow and decide on a Pleurex of effusions recur - Patient does not want any treatment for cancer and therefore I have consulted palliative care-her oncologist Dr. Marko Plume has also spoken with her today - she will go back home and palliative care will follow -At this time she is able to ambulate in the hall without dyspnea and has no O2 requirements-oxygen saturation 94% with exertion   (B) Right lower lobe pneumonia?? -Strep and legionella antigens negative -Received 5 days of Levaquin (about 2 weeks prior to admission) as outpatient followed by Vanco and Zosyn beginning on 9/4 - - above mentioned malignant effusions likely the underlying cause of her respiratory failure-pulmonary in agreement -stopped antibiotics on 9/7  (C) acute on chronic diastolic heart failure? -Echo reveals normal systolic function and grade 1 diastolic dysfunction-no pulmonary edema on x-rays -Has been receiving IV Lasix intermittently for this diagnosis -  I held it  on 9/7- doubtful that she had acute heart failure   Endometrial cancer -Debulking at [facility] on 09/30/14 -Renal function was too poor for adjuvant chemotherapy which the patient did not want as well, she declined local radiation  Chronic renal failure, stage 4 (severe) -Baseline creatinine is about 2.8 -Discontinued Lasix  Essential hypertension, benign -Norvasc, doxazosin and metoprolol   Type 2 diabetes mellitus  with diabetic chronic kidney disease -NovoLog 70/30-   Anemia of chronic disease, iron deficiency anemia   Consultants: Oncology, pulmonary, palliative Procedures: 01/26/15 2-D echo Left ventricle: The cavity size was normal. Systolic function was normal. The estimated ejection fraction was in the range of 55% to 60%. Wall motion was normal; there were no regional wall motion abnormalities. Doppler parameters are consistent with abnormal left ventricular relaxation (grade 1 diastolic dysfunction). - Aortic valve: There was mild regurgitation. 9/4 Right-sided thoracentesis 9/6 left-sided thoracentesis  Discharge Exam: Filed Weights   01/26/15 0344 01/28/15 1108 01/29/15 0605  Weight: 82 kg (180 lb 12.4 oz) 84.7 kg (186 lb 11.7 oz) 82.9 kg (182 lb 12.2 oz)   Filed Vitals:   01/29/15 0605  BP: 143/65  Pulse: 83  Temp: 97.4 F (36.3 C)  Resp: 18    General: AAO x 3, no distress Cardiovascular: RRR, no murmurs  Respiratory: clear to auscultation bilaterally GI: soft, non-tender, non-distended, bowel sound positive  Discharge Instructions You were cared for by a hospitalist during your hospital stay. If you have any questions about your discharge medications or the care you received while you were in the hospital after you are discharged, you can call the unit and asked to speak with the hospitalist on call if the hospitalist that took care of you is not available. Once you are discharged, your primary care physician will handle any further medical issues. Please note that NO REFILLS for any discharge medications will be authorized once you are discharged, as it is imperative that you return to your primary care physician (or establish a relationship with a primary care physician if you do not have one) for your aftercare needs so that they can reassess your need for medications and monitor your lab values.  Discharge Instructions    Discharge instructions    Complete by:   As directed   Diabetic low sodium, heart healthy diet     Increase activity slowly    Complete by:  As directed             Medication List    TAKE these medications        albuterol 108 (90 BASE) MCG/ACT inhaler  Commonly known as:  PROVENTIL HFA;VENTOLIN HFA  Inhale 1-2 puffs into the lungs every 6 (six) hours as needed for wheezing or shortness of breath.     amLODipine 10 MG tablet  Commonly known as:  NORVASC  Take 1 tablet (10 mg total) by mouth daily.     atorvastatin 80 MG tablet  Commonly known as:  LIPITOR  TAKE ONE TABLET BY MOUTH ONCE DAILY     COMBIGAN 0.2-0.5 % ophthalmic solution  Generic drug:  brimonidine-timolol  Place 1 drop into both eyes daily.     doxazosin 1 MG tablet  Commonly known as:  CARDURA  Take 1 mg by mouth at bedtime.     hydrochlorothiazide 25 MG tablet  Commonly known as:  HYDRODIURIL  TAKE ONE TABLET BY MOUTH EVERY DAY     insulin NPH-regular Human (70-30) 100 UNIT/ML injection  Commonly known as:  NOVOLIN 70/30  Inject 20-25 Units into the skin 2 (two) times daily with a meal. Inject 25 units in AM and 20 UNits in PM with meals.     Lancets Misc  by Does not apply route.     metoprolol 200 MG 24 hr tablet  Commonly  known as:  TOPROL-XL  TAKE ONE TABLET BY MOUTH ONCE DAILY     ONE TOUCH ULTRA TEST test strip  Generic drug:  glucose blood  USE ONE TEST STRIP TO CHECK BLOOD SUGAR EVERY DAY       No Known Allergies     Follow-up Information    Follow up with Eliezer Lofts, MD. Schedule an appointment as soon as possible for a visit on 02/04/2015.   Specialty:  Family Medicine   Contact information:   [place] [address] [phone]       Follow up with Eliezer Lofts, MD On 02/06/2015.   Specialty:  Family Medicine   Why:  Appointment is for Friday, Sept 16, 2016 @ 9:00 am   Contact information:   [place]  [id number] [phone]        The results of significant  diagnostics from this hospitalization (including imaging, microbiology, ancillary and laboratory) are listed below for reference.    Significant Diagnostic Studies: Ct Abdomen Pelvis Wo Contrast  01/05/2015   CLINICAL DATA:  Subsequent treatment strategy for endometrial carcinoma.  EXAM: CT ABDOMEN AND PELVIS WITHOUT CONTRAST  TECHNIQUE: Multidetector CT imaging of the abdomen and pelvis was performed following the standard protocol without IV contrast.  COMPARISON:  09/23/2014.  FINDINGS: Lower chest: New bilateral pleural effusions which are small to moderate in size with the RIGHT effusion greater than the LEFT.  Hepatobiliary: Nonenhanced images demonstrated new relative low-density lesion in the LEFT hepatic lobe measuring 2.3 cm (image 17, series 2). There is a small amount of fluid surrounding the liver margin which is also new from prior.  Pancreas: Pancreas is normal. No ductal dilatation. No pancreatic inflammation.  Spleen: Normal spleen  Adrenals/urinary tract: Adrenal glands are nodular abut unchanged from comparison exam. Largest nodules in the LEFT adrenal gland on image 25, series 2 measuring 15 mm compared to 15 mm.  The kidneys are atrophic with multiple mixed density cysts consistent with chronic cystic kidney disease. No hydronephrosis evident. Bladder is normal.  Stomach/Bowel: Stomach, small bowel, appendix, and cecum are normal. The colon and rectosigmoid colon are normal.  Vascular/Lymphatic: Heavy calcification of abdominal aorta. No retroperitoneal periportal lymphadenopathy. No pelvic lymphadenopathy.  Reproductive: Post hysterectomy anatomy. No evidence of local carcinoma recurrence.  Musculoskeletal: No aggressive osseous lesion.  Other: There is some thickening along the ventral peritoneal surface which is ill-defined image 37, series 2 and image 49, series 2. No discrete nodularity.  IMPRESSION: 1. New lesion LEFT hepatic lobe is concerning for hepatic metastasis. Consider MRI or  PET-CT scan for further evaluation if patient cannot receive IV contrast. 2. New bilateral pleural effusions and small amount of intraperitoneal free fluid. 3. Thickening along the ventral peritoneal surface is new and likely relates to abdominal surgery. No discrete nodularity. 4. Stable nodular thickening of the adrenal glands. These results will be called to the ordering clinician or representative by the Radiologist Assistant, and communication documented in the PACS or zVision Dashboard.   Electronically Signed   By: Suzy Bouchard M.D.   On: 01/05/2015 09:07   Dg Chest 1 View  01/27/2015   CLINICAL DATA:  Status post left thoracentesis.  EXAM: CHEST  1 VIEW  COMPARISON:  01/25/2015 chest radiograph  FINDINGS: Stable cardiomediastinal silhouette, with top-normal heart size. No pneumothorax. Trace right pleural effusion, increased. No residual left pleural effusion. No pulmonary edema. Mild curvilinear bibasilar lung opacities, increased bilaterally.  IMPRESSION: 1. No  pneumothorax. 2. Increased trace right pleural effusion. 3. Increased mild bibasilar lung opacities, likely atelectasis.   Electronically Signed   By: Ilona Sorrel M.D.   On: 01/27/2015 12:08   Dg Chest 1 View  01/25/2015   CLINICAL DATA:  Status post right thoracentesis.  EXAM: CHEST  1 VIEW  COMPARISON:  01/25/2015  FINDINGS: Heart is enlarged also accentuated by technique. Significant decreased size of right pleural effusion. There is elevation of the right hemidiaphragm. No pneumothorax. There is density in the medial left lung base consistent with infiltrate.  IMPRESSION: Significant decreased size of right pleural effusion. No pneumothorax. Next item persistent left lower lobe opacity consistent with infiltrate or significant atelectasis.   Electronically Signed   By: Nolon Nations M.D.   On: 01/25/2015 10:42   Dg Chest 2 View  01/25/2015   CLINICAL DATA:  Subacute onset of shortness of breath. Initial encounter.  EXAM: CHEST  2  VIEW  COMPARISON:  Chest radiograph performed 01/23/2015  FINDINGS: A moderate to large right-sided pleural effusion is noted, with right basilar airspace opacity. Mild left basilar airspace opacity is also noted. This may reflect asymmetric interstitial edema or possibly pneumonia. No pneumothorax is seen.  The cardiomediastinal silhouette is borderline enlarged. No acute osseous abnormalities are identified.  IMPRESSION: Moderate to large right-sided pleural effusion, with right basilar airspace opacity. Mild left basilar airspace opacity also seen. Borderline cardiomegaly. This may reflect asymmetric interstitial edema or possibly pneumonia. Followup PA and lateral chest X-ray is recommended in 3-4 weeks, following treatment, to ensure resolution and exclude underlying malignancy.   Electronically Signed   By: Garald Balding M.D.   On: 01/25/2015 02:05   Dg Chest 2 View  01/23/2015   CLINICAL DATA:  81 year old female with shortness of breath for 1 week. Wheezing and cough. On antibiotics for recently diagnosed pneumonia. Former smoker. Current history of endometrial carcinoma. Subsequent encounter.  EXAM: CHEST  2 VIEW  COMPARISON:  18 2016 chest radiographs. CT Abdomen and Pelvis 01/05/2015, and earlier  FINDINGS: Lower lung volumes with bilateral pleural effusions. Platelike atelectasis in the right lung. No definite air bronchograms. Normal cardiac size and mediastinal contours. Non obstructed visible bowel gas pattern. No acute osseous abnormality identified. Calcified aortic atherosclerosis.  IMPRESSION: Bilateral pleural effusions continue and appear progressed since August. Additional right lung opacity favored to be subsegmental atelectasis.  See also CT Abdomen and Pelvis report 01/05/2015 regarding new liver lesion and ascites.   Electronically Signed   By: Genevie Ann M.D.   On: 01/23/2015 13:12   Dg Chest 2 View  01/08/2015   CLINICAL DATA:  Shortness of breath.  EXAM: CHEST  2 VIEW  COMPARISON:   None.  FINDINGS: Mediastinum and hilar structures are normal. Right lower lobe infiltrate consistent with pneumonia. Small right pleural effusion. Heart size normal. No pneumothorax. No acute bony abnormality .  IMPRESSION: Right lower lobe infiltrate consistent with pneumonia. Small right pleural effusion.   Electronically Signed   By: Marcello Moores  Register   On: 01/08/2015 11:46   Nm Pulmonary Perf And Vent  01/25/2015   CLINICAL DATA:  Ongoing shortness of breath since 01/05/2015. Symptoms worse over the last week. Bilateral leg swelling. History of chronic kidney disease in treatment for pneumonia.  EXAM: NUCLEAR MEDICINE VENTILATION - PERFUSION LUNG SCAN  TECHNIQUE: Ventilation images were obtained in multiple projections using inhaled aerosol Tc-73m DTPA. Perfusion images were obtained in multiple projections after intravenous injection of Tc-86m MAA.  RADIOPHARMACEUTICALS:  42.1 Technetium-71m  DTPA aerosol inhalation and 6.2 Technetium-67m MAA IV  COMPARISON:  Chest x-ray 01/25/2015  FINDINGS: Ventilation: Mild patchy ventilation consistent with clumped particles.  Perfusion: No pleural-based wedge-shaped defects. Matched deficit at the right lung base consistent with large pleural effusion as seen on chest x-ray.  IMPRESSION: Low probability for clinically significant pulmonary embolus.   Electronically Signed   By: Nolon Nations M.D.   On: 01/25/2015 09:31   US Thoracentesis Asp Pleural Space W/img Guide  01/27/2015   INDICATION: Symptomatic left sided pleural effusion  EXAM: US THORACENTESIS ASP PLEURAL SPACE W/IMG GUIDE  COMPARISON:  Right Thoracentesis 01/25/15.  MEDICATIONS: None  COMPLICATIONS: None immediate  TECHNIQUE: Informed written consent was obtained from the patient after a discussion of the risks, benefits and alternatives to treatment. A timeout was performed prior to the initiation of the procedure.  Initial ultrasound scanning demonstrates a left pleural effusion. The lower chest was  prepped and draped in the usual sterile fashion. 1% lidocaine was used for local anesthesia.  Under direct ultrasound guidance, a 19 gauge, 7-cm, Yueh catheter was introduced. An ultrasound image was saved for documentation purposes. The thoracentesis was performed. The catheter was removed and a dressing was applied. The patient tolerated the procedure well without immediate post procedural complication. The patient was escorted to have an upright chest radiograph.  FINDINGS: A total of approximately 550 ml of serous fluid was removed.  IMPRESSION: Successful ultrasound-guided left sided thoracentesis yielding 550 ml of pleural fluid.  Read By:  Tsosie Billing PA-C   Electronically Signed   By: Aletta Edouard M.D.   On: 01/27/2015 11:31   US Thoracentesis Asp Pleural Space W/img Guide  01/25/2015   INDICATION: Symptomatic right sided pleural effusion  EXAM: US THORACENTESIS ASP PLEURAL SPACE W/IMG GUIDE  COMPARISON:  Chest radiograph - 01/25/2015  MEDICATIONS: None  COMPLICATIONS: None immediate  TECHNIQUE: Informed written consent was obtained from the patient after a discussion of the risks, benefits and alternatives to treatment. A timeout was performed prior to the initiation of the procedure.  Initial ultrasound scanning demonstrates a moderate to large pleural effusion. The lower chest was prepped and draped in the usual sterile fashion. 1% lidocaine was used for local anesthesia.  An ultrasound image was saved for documentation purposes. An 8 Fr Safe-T-Centesis catheter was introduced. The thoracentesis was performed. The catheter was removed and a dressing was applied. The patient tolerated the procedure well without immediate post procedural complication. The patient was escorted to have an upright chest radiograph.  FINDINGS: A total of approximately 1.4 liters of serous, slightly blood tinged fluid was removed. Requested samples were sent to the laboratory.  IMPRESSION: Successful ultrasound-guided  right sided thoracentesis yielding 1.4 liters of pleural fluid.   Electronically Signed   By: Sandi Mariscal M.D.   On: 01/25/2015 15:22    Microbiology: Recent Results (from the past 240 hour(s))  Culture, blood (x 2)     Status: None (Preliminary result)   Collection Time: 01/25/15  5:36 AM  Result Value Ref Range Status   Specimen Description BLOOD RIGHT WRIST  Final   Special Requests BOTTLES DRAWN AEROBIC AND ANAEROBIC 6CC  Final   Culture   Final    NO GROWTH 3 DAYS Performed at [facility]    Report Status PENDING  Incomplete  Culture, blood (x 2)     Status: None (Preliminary result)   Collection Time: 01/25/15  5:40 AM  Result Value Ref Range Status   Specimen  Description BLOOD BLOOD LEFT FOREARM  Final   Special Requests BOTTLES DRAWN AEROBIC AND ANAEROBIC 5ML  Final   Culture   Final    NO GROWTH 3 DAYS Performed at [facility]    Report Status PENDING  Incomplete  Body fluid culture     Status: None   Collection Time: 01/25/15 10:31 AM  Result Value Ref Range Status   Specimen Description PLEURAL RIGHT  Final   Special Requests NONE  Final   Gram Stain   Final    MODERATE WBC PRESENT, PREDOMINANTLY MONONUCLEAR NO ORGANISMS SEEN GRAM STAIN REVIEWED-AGREE WITH RESULT K WOOLEN    Culture   Final    NO GROWTH 3 DAYS Performed at [facility]    Report Status 01/29/2015 FINAL  Final  MRSA PCR Screening     Status: None   Collection Time: 01/25/15  2:40 PM  Result Value Ref Range Status   MRSA by PCR NEGATIVE NEGATIVE Final    Comment:        The GeneXpert MRSA Assay (FDA approved for NASAL specimens only), is one component of a comprehensive MRSA colonization surveillance program. It is not intended to diagnose MRSA infection nor to guide or monitor treatment for MRSA infections.      Labs: Basic Metabolic Panel:  Recent Labs Lab 01/25/15 0104 01/25/15 0535 01/26/15 0346 01/27/15 0345 01/28/15 0525  NA 145 141 143 144 145   K 4.8 5.0 4.8 4.4 4.2  CL 113* 111 114* 113* 112*  CO2 24 20* 24 24 26   GLUCOSE 187* 211* 164* 103* 98  BUN 73* 72* 71* 74* 70*  CREATININE 3.57* 3.50* 3.25* 3.20* 3.56*  CALCIUM 8.9 8.7* 8.3* 8.4* 8.6*   Liver Function Tests:  Recent Labs Lab 01/23/15 1317 01/25/15 0104 01/25/15 0535  AST <7 8* 8*  ALT 8 9* 11*  ALKPHOS 90 81 80  BILITOT 0.33 0.5 0.2*  PROT 7.2 7.5 7.4  ALBUMIN 3.4* 3.7 3.6   No results for input(s): LIPASE, AMYLASE in the last 168 hours. No results for input(s): AMMONIA in the last 168 hours. CBC:  Recent Labs Lab 01/23/15 1317 01/25/15 0104 01/25/15 0535 01/26/15 0346 01/27/15 0345 01/28/15 0525  WBC 12.9* 15.5* 16.1* 17.9* 13.4* 13.1*  NEUTROABS 10.9*  --  15.3*  --   --   --   HGB 9.6* 9.9* 9.5* 8.5* 8.7* 9.0*  HCT 31.3* 32.2* 30.8* 28.3* 27.9* 29.5*  MCV 74.5* 75.9* 76.2* 75.9* 76.0* 76.2*  PLT 456* 496* 443* 404* 433* 407*   Cardiac Enzymes: No results for input(s): CKTOTAL, CKMB, CKMBINDEX, TROPONINI in the last 168 hours. BNP: BNP (last 3 results)  Recent Labs  01/25/15 0536  BNP 268.0*    ProBNP (last 3 results) No results for input(s): PROBNP in the last 8760 hours.  CBG:  Recent Labs Lab 01/28/15 0005 01/28/15 0640 01/28/15 1157 01/28/15 1759 01/29/15 0034  GLUCAP 87 109* 118* 99 78       SignedDebbe Odea, MD [facility] 01/29/2015, 11:35 AM

## 2015-01-29 NOTE — Progress Notes (Signed)
Daily Progress Note   Patient Name: Doris [name]       Date: 01/29/2015 DOB: [birth year]  Age: 81 y.o. MRN#: [id number] Attending Physician: Debbe Odea, MD Primary Care Physician: Eliezer Lofts, MD Admit Date: 01/25/2015  Life limiting illness: Metastatic endometrial cancer  Reason for Consultation/Follow-up: Disposition and Establishing goals of care  Subjective: Patient is feeling okay this morning, states she is ready to leave the hospital. Breathing continues to be improved after the thoracentesis, currently she does not have any supplemental oxygen on. She denies any pain. Daughter Doris [name] at the bedside is tearful about the situation which is certainly understandable considering the recent loss of her sister from ovarian cancer several years ago. Doris [name] seems to understand her mother's decisions regarding her goals of care, to be DNR, and wanting to be home and be comfortable.  PLAN: DNR DNI Hospice consult Home with hospice  Length of Stay: 4 days  Current Medications: Scheduled Meds:  . amLODipine  10 mg Oral Daily  . antiseptic oral rinse  7 mL Mouth Rinse BID  . brimonidine  1 drop Both Eyes Daily   And  . timolol  1 drop Both Eyes Daily  . doxazosin  1 mg Oral QHS  . heparin  5,000 Units Subcutaneous 3 times per day  . insulin aspart protamine- aspart  15 Units Subcutaneous BID WC  . metoprolol tartrate  25 mg Oral BID    Continuous Infusions:    PRN Meds: ipratropium, technetium TC 13M diethylenetriame-pentaacetic acid  Palliative Performance Scale: 60%     Vital Signs: BP 143/65 mmHg  Pulse 83  Temp(Src) 97.4 F (36.3 C) (Oral)  Resp 18  Ht 5' 8.5" (1.74 m)  Wt 82.9 kg (182 lb 12.2 oz)  BMI 27.38 kg/m2  SpO2 100% SpO2: SpO2: 100 % O2 Device: O2 Device: Nasal Cannula O2 Flow Rate: O2 Flow Rate (L/min): 2 L/min  Intake/output summary: No intake or output data in the 24 hours ending 01/29/15 1113 LBM:   Baseline Weight: Weight: 82.6 kg (182 lb 1.6  oz) Most recent weight: Weight: 82.9 kg (182 lb 12.2 oz)  Physical Exam: Vitals reviewed General: sitting on side of bed in NAD Resp: breathing comfortably off supplemental oxygen Neuro: alert and oriented              Additional Data Reviewed: Recent Labs     01/27/15  0345  01/28/15  0525  WBC  13.4*  13.1*  HGB  8.7*  9.0*  PLT  433*  407*  NA  144  145  BUN  74*  70*  CREATININE  3.20*  3.56*     Problem List:  Patient Active Problem List   Diagnosis Date Noted  . Encounter for palliative care   . Pleural effusion 01/25/2015  . Acute respiratory failure with hypoxia 01/25/2015  . HCAP (healthcare-associated pneumonia) 01/25/2015  . Dyspnea 01/25/2015  . Bilateral pleural effusion 01/09/2015  . Pulmonary infiltrate in right lung on chest x-ray 01/09/2015  . Acute bronchitis 11/21/2014  . Viral conjunctivitis 11/21/2014  . Chronic kidney disease 11/14/2014  . Type 2 diabetes mellitus with diabetic chronic kidney disease 11/14/2014  . Acute on chronic renal failure 10/24/2014  . Pre-operative cardiovascular examination 09/23/2014  . Endometrial cancer 09/18/2014  . Counseling regarding end of life decision making 09/16/2014  . Post-menopausal bleeding 08/14/2014  . CKD stage 4 due to type 2 diabetes mellitus 11/19/2013  . DM type 2 causing CKD stage 4 08/16/2013  .  Vitamin D deficiency 11/11/2011  . Sciatica 10/25/2011  . CENTRAL RETINAL VEIN OCCLUSION 08/11/2009  . Hyperlipidemia LDL goal <100 08/10/2006  . Anemia of chronic renal failure, stage 4 (severe) 08/10/2006  . TOBACCO ABUSE 08/10/2006  . GLAUCOMA NOS 08/10/2006  . Essential hypertension, benign 08/10/2006  . OSTEOARTHRITIS 08/10/2006     Palliative Care Assessment & Plan    Code Status:  DNR, DNI  Goals of Care:  Home with home hospice, does not desire life-sustaining treatments  She is open to pleurx catheter to help with her breathing should her pleural effusions occur  Desire for  further Chaplaincy support:no  3. Symptom Management:  Not currently in any pain  Supplemental oxygen as needed  Consider pleurx catheter for recurring pleural effusions  4. Palliative Prophylaxis:  Stool Softener: start if needing opioid pain management  5. Prognosis: < 6 months  5. Discharge Planning: Home with Hospice   Care plan was discussed with patient, daughter Doris [name]   Thank you for allowing the Palliative Medicine Team to assist in the care of this patient.   Time In: 1050 Time Out: 1120 Total Time 30 Prolonged Time Billed  no     Greater than 50%  of this time was spent counseling and coordinating care related to the above assessment and plan.  Ernie Hew, MD  01/29/2015, 11:13 AM  Please contact Palliative Medicine Team phone at [phone] for questions and concerns.  Addendum: Patient seen and case discussed with Dr Lamar Benes.  Agree with his assessment and documentation Discussed in detail with patient and her daughter about DNR DNI hospice at home All questions answered.    Loistine Chance MD [facility] health palliative medicine team [phone]

## 2015-01-29 NOTE — Progress Notes (Addendum)
SATURATION QUALIFICATIONS: (This note is used to comply with regulatory documentation for home oxygen)  Patient Saturations on Room Air at Rest = 100%  Patient Saturations on Room Air while Ambulating = 94%  Patient Saturations on 2Liters of oxygen while Ambulating = 98%  Please briefly explain why patient needs home oxygen:

## 2015-01-29 NOTE — Consult Note (Signed)
Consultation Note Date: 01/29/2015   Patient Name: Doris [name]  DOB: 1932-07-29  MRN: [id number]  Age / Sex: 81 y.o., female   PCP: Jinny Sanders, MD Referring Physician: Debbe Odea, MD  Reason for Consultation: Establishing goals of care  Palliative Care Assessment and Plan Summary of Established Goals of Care and Medical Treatment Preferences    81 y.o. female with known endometrial cancer, on observation status post resection, essential hypertension, diabetes mellitus, chronic kidney disease, chronic anemia, former smoker, presented to [facility] ED with main concern of dyspnea with exertion and at rest, associated with mixed episodes of non productive and productive cough of clear sputum. Pt was recently treated with Levaquin for presumptive right lobe PNA with Levaquin about two weeks prior to this admission but her symptoms have gotten worse.   Since admission, patient has been seen and evaluated by pulmonary/critical care medicine. Patient's imaging has shown liver metastases, fluid around the liver.  bilateral pleural effusions, patient underwent right and left-sided thoracenteses. It is known that cytology of the pleural fluid is consistent with metastatic adenocarcinoma.  A palliative consultation has been requested for CODE STATUS, goals of care discussions, appropriate disposition discussions. Ms. Doris [name] is an 81 year old lady who is originally from [place], [place]. She used to work as a Magazine features editor. She spent a long time working as a Magazine features editor at [employer]. Subsequently, she moved back to [facility] to take care of her elderly parents. She states that she is brought to have been there caregiver and did not place them in a nursing home. Patient's daughter Mertha Clyatt lives with her. Patient also has a granddaughter who lives with her. Patient's other daughter died of ovarian cancer 2 years ago. Patient states she was very active at her local  church up until a few years ago. Patient states she was diagnosed with endometrial cancer a few months ago.  CODE STATUS discussions undertaken. The patient does not wish to be placed on a ventilator, does not wish to have compressions/shocks. If she is found actively dying, she wants comfort measures. She states she will take up these discussions further with her daughter and grandchildren. Patient wishes to go back home. She is accepting of addition of hospice services.  Call placed and was unable to get in touch with daughter Elaria Osias on 01/28/15. Reached the patient's daughter Kathrene Sinopoli in the patient's room in a.m. on 01/29/15. Family meeting scheduled for 11 AM on 01/29/15.  Further discussion and additional recommendations will follow.   Contacts/Participants in Discussion: Primary Decision Maker:  Patient, then her daughter Darlys Buis    HCPOA: no   Discussed with patient about establishing a HCPOA agent, she believes it ought to be her daughter Jassmin Kemmerer who lives with her but wishes to think this through some more.   Code Status/Advance Care Planning:  Full code for now, but patient is very strongly considering DNR DNI.   Symptom Management:   No acute symptoms currently, continue to monitor.   Palliative Prophylaxis: yes  Additional Recommendations (Limitations, Scope, Preferences):  Family meeting with patient and her daughter scheduled for 01-29-15 at 65 am.  Psycho-social/Spiritual:   Support System: strong, daughter, grand children.   Desire for further Chaplaincy support:no  Prognosis: < 6 months  Discharge Planning: likely  Home with Hospice    Values: patient has expressed about not being on a breathing machine, she is aware she has cancer that is spreading. She wishes to  be at home.  Life limiting illness: endometrial cancer      Chief Complaint/History of Present Illness: shortness of breath   Primary Diagnoses  Present on Admission:  . Pleural  effusion . Acute respiratory failure with hypoxia . Type 2 diabetes mellitus with diabetic chronic kidney disease . Hyperlipidemia LDL goal <100 . Essential hypertension, benign . Anemia of chronic renal failure, stage 4 (severe) . HCAP (healthcare-associated pneumonia) . Dyspnea  Palliative Review of Systems: Completed  I have reviewed the medical record, interviewed the patient and family, and examined the patient. The following aspects are pertinent.  Past Medical History  Diagnosis Date  . Diabetes mellitus   . Hyperlipidemia   . Hypertension   . Arthritis   . Seizures   . Enlarged heart   . Endometrial cancer   . Pneumonia    Social History   Social History  . Marital Status: Married    Spouse Name: N/A  . Number of Children: 3  . Years of Education: N/A   Social History Main Topics  . Smoking status: Former Smoker -- 0.50 packs/day for 60 years    Types: Cigarettes    Quit date: 05/23/2010  . Smokeless tobacco: Never Used  . Alcohol Use: No  . Drug Use: No  . Sexual Activity: Not Asked   Other Topics Concern  . None   Social History Narrative   Exercise: Walking irregularly.   Diet: trying to improved   No living will, no HCPOA   Family History  Problem Relation Age of Onset  . Hypertension Mother   . Heart attack Father   . Ovarian cancer Daughter 78   Scheduled Meds: . amLODipine  10 mg Oral Daily  . antiseptic oral rinse  7 mL Mouth Rinse BID  . brimonidine  1 drop Both Eyes Daily   And  . timolol  1 drop Both Eyes Daily  . doxazosin  1 mg Oral QHS  . heparin  5,000 Units Subcutaneous 3 times per day  . insulin aspart protamine- aspart  15 Units Subcutaneous BID WC  . metoprolol tartrate  25 mg Oral BID   Continuous Infusions:  PRN Meds:.ipratropium, technetium TC 67M diethylenetriame-pentaacetic acid Medications Prior to Admission:  Prior to Admission medications   Medication Sig Start Date End Date Taking? Authorizing Provider    albuterol (PROVENTIL HFA;VENTOLIN HFA) 108 (90 BASE) MCG/ACT inhaler Inhale 1-2 puffs into the lungs every 6 (six) hours as needed for wheezing or shortness of breath. 01/23/15  Yes Susanne Borders, NP  amLODipine (NORVASC) 10 MG tablet Take 1 tablet (10 mg total) by mouth daily. 09/11/14  Yes Amy E Bedsole, MD  atorvastatin (LIPITOR) 80 MG tablet TAKE ONE TABLET BY MOUTH ONCE DAILY 04/11/14  Yes Amy E Bedsole, MD  COMBIGAN 0.2-0.5 % ophthalmic solution Place 1 drop into both eyes daily.  09/11/14  Yes Historical Provider, MD  doxazosin (CARDURA) 1 MG tablet Take 1 mg by mouth at bedtime.   Yes Historical Provider, MD  hydrochlorothiazide (HYDRODIURIL) 25 MG tablet TAKE ONE TABLET BY MOUTH EVERY DAY 09/11/14  Yes Amy Cletis Athens, MD  insulin NPH-regular Human (NOVOLIN 70/30) (70-30) 100 UNIT/ML injection Inject 20-25 Units into the skin 2 (two) times daily with a meal. Inject 25 units in AM and 20 UNits in PM with meals. 05/02/11  Yes Amy Cletis [place], MD  metoprolol (TOPROL-XL) 200 MG 24 hr tablet TAKE ONE TABLET BY MOUTH ONCE DAILY 09/11/14  Yes Amy E Bedsole,  MD  Lancets MISC by Does not apply route.      Historical Provider, MD  ONE TOUCH ULTRA TEST test strip USE ONE TEST STRIP TO CHECK BLOOD SUGAR EVERY DAY Patient not taking: Reported on 01/25/2015 08/19/10   Jinny Sanders, MD   No Known Allergies CBC:    Component Value Date/Time   WBC 13.1* 01/28/2015 0525   WBC 12.9* 01/23/2015 1317   HGB 9.0* 01/28/2015 0525   HGB 9.6* 01/23/2015 1317   HCT 29.5* 01/28/2015 0525   HCT 31.3* 01/23/2015 1317   PLT 407* 01/28/2015 0525   PLT 456* 01/23/2015 1317   MCV 76.2* 01/28/2015 0525   MCV 74.5* 01/23/2015 1317   NEUTROABS 15.3* 01/25/2015 0535   NEUTROABS 10.9* 01/23/2015 1317   LYMPHSABS 0.7 01/25/2015 0535   LYMPHSABS 1.2 01/23/2015 1317   MONOABS 0.1 01/25/2015 0535   MONOABS 0.6 01/23/2015 1317   EOSABS 0.0 01/25/2015 0535   EOSABS 0.2 01/23/2015 1317   BASOSABS 0.0 01/25/2015 0535    BASOSABS 0.0 01/23/2015 1317   Comprehensive Metabolic Panel:    Component Value Date/Time   NA 145 01/28/2015 0525   NA 145 01/23/2015 1317   NA 142 05/04/2012   K 4.2 01/28/2015 0525   K 4.7 01/23/2015 1317   CL 112* 01/28/2015 0525   CO2 26 01/28/2015 0525   CO2 23 01/23/2015 1317   BUN 70* 01/28/2015 0525   BUN 66.3* 01/23/2015 1317   BUN 43* 05/04/2012   CREATININE 3.56* 01/28/2015 0525   CREATININE 3.3* 01/23/2015 1317   CREATININE 2.5* 05/04/2012   CREATININE 2.24* 02/20/2012 0907   GLUCOSE 98 01/28/2015 0525   GLUCOSE 131 01/23/2015 1317   CALCIUM 8.6* 01/28/2015 0525   CALCIUM 9.1 01/23/2015 1317   AST 8* 01/25/2015 0535   AST <7 01/23/2015 1317   ALT 11* 01/25/2015 0535   ALT 8 01/23/2015 1317   ALKPHOS 80 01/25/2015 0535   ALKPHOS 90 01/23/2015 1317   BILITOT 0.2* 01/25/2015 0535   BILITOT 0.33 01/23/2015 1317   PROT 7.4 01/25/2015 0535   PROT 7.2 01/23/2015 1317   ALBUMIN 3.6 01/25/2015 0535   ALBUMIN 3.4* 01/23/2015 1317    Physical Exam: Vital Signs: BP 143/65 mmHg  Pulse 83  Temp(Src) 97.4 F (36.3 C) (Oral)  Resp 18  Ht 5' 8.5" (1.74 m)  Wt 82.9 kg (182 lb 12.2 oz)  BMI 27.38 kg/m2  SpO2 100% SpO2: SpO2: 100 % O2 Device: O2 Device: Nasal Cannula O2 Flow Rate: O2 Flow Rate (L/min): 2 L/min Intake/output summary: No intake or output data in the 24 hours ending 01/29/15 0804 LBM: Last BM Date: 01/28/15 Baseline Weight: Weight: 82.6 kg (182 lb 1.6 oz) Most recent weight: Weight: 82.9 kg (182 lb 12.2 oz)  Exam Findings:  NAD Clear S1S2 Abdomen soft Extremities no edema Non focal                         Palliative Performance Scale: 40%  Additional Data Reviewed: Recent Labs     01/27/15  0345  01/28/15  0525  WBC  13.4*  13.1*  HGB  8.7*  9.0*  PLT  433*  407*  NA  144  145  BUN  74*  70*  CREATININE  3.20*  3.56*     Time In: 1400 on 01-28-15 Time Out: 1500 on 01-28-15 Time Total: 60 min  Greater than 50%  of this time was  spent counseling  and coordinating care related to the above assessment and plan.  Signed by: Loistine Chance, MD [place], MD  01/29/2015, 8:04 AM  Please contact Palliative Medicine Team phone at [phone] for questions and concerns.

## 2015-01-29 NOTE — Care Management Note (Signed)
Case Management Note  Patient Details  Name: Doris [name] MRN: [id number] Date of Birth: 10/03/1932  Subjective/Objective:        81 yo admitted with pleural effusion. Hx of Metastatic endometrial cancer             Action/Plan: From home with daughter  Expected Discharge Date:  01/28/15               Expected Discharge Plan:  Home w Hospice Care  In-House Referral:  NA  Discharge planning Services  CM Consult  Post Acute Care Choice:  NA Choice offered to:  Patient  DME Arranged:    DME Agency:     HH Arranged:  Disease Management [place] Agency:  [facility]  Status of Service:  Completed, signed off  Medicare Important Message Given:  Yes-second notification given Date Medicare IM Given:    Medicare IM give by:    Date Additional Medicare IM Given:    Additional Medicare Important Message give by:     If discussed at [place] of Stay Meetings, dates discussed:    Additional Comments: CM consult for home with hospice. This CM met with pt at bedside to offer choice for home hospice services. Pt chose [facility]. Referral called in to [facility] referral line. Pt states that she does not have any equipment needs at this time and has a bedside commode at home currently. Pt plans to go back home with her daughter. She feels safe transporting home via her daughter's car. No other CM needs noted at this time. RN made aware of all above information. Lynnell Catalan, RN 01/29/2015, 1:20 PM

## 2015-01-30 ENCOUNTER — Telehealth: Payer: Self-pay | Admitting: *Deleted

## 2015-01-30 ENCOUNTER — Encounter: Payer: Self-pay | Admitting: *Deleted

## 2015-01-30 ENCOUNTER — Telehealth: Payer: Self-pay

## 2015-01-30 LAB — CULTURE, BLOOD (ROUTINE X 2)
CULTURE: NO GROWTH
CULTURE: NO GROWTH

## 2015-01-30 NOTE — Telephone Encounter (Signed)
Transition Care Management Follow-up Telephone Call   Date discharged? 01/29/15   How have you been since you were released from the hospital? Patient is fair.  Breathing has improved.   Do you understand why you were in the hospital? yes   Do you understand the discharge instructions? yes   Where were you discharged to? Home with Hospice   Items Reviewed:  Medications reviewed: yes  Allergies reviewed: yes  Dietary changes reviewed: no  Referrals reviewed: yes, Hospice   Functional Questionnaire:   Activities of Daily Living (ADLs):   She states they are independent in the following: ambulation, feeding, continence and toileting States they require assistance with the following: bathing and hygiene, grooming and dressing   Any transportation issues/concerns?: no   Any patient concerns? no   Confirmed importance and date/time of follow-up visits scheduled Yes.  Appointment scheduled with Dr. Diona Browner for 9/16 at 0900 was cancelled per patient's request.  Dr. Marko Plume will be taking over care.  Confirmed with patient if condition begins to worsen call PCP or go to the ER.  Patient was given the office number and encouraged to call back with question or concerns.  : yes, advised patient to call to our office to follow up as needed.

## 2015-01-30 NOTE — Telephone Encounter (Signed)
Doris [name] an order for a DNR and for Hospice physician to sign order.

## 2015-01-30 NOTE — Telephone Encounter (Signed)
Please call Dr. Marko Plume and make sure he is comfortable with her canceling her appt here and Dr. Marko Plume "taking over care" as she stated in transitional care phone call. She is now on hospice. Will he be taking over her hospice care?

## 2015-02-04 NOTE — Telephone Encounter (Signed)
Re triage note 9-14  Please let Dr Rometta Emery office know that I am glad to manage her with hospice if Dr Diona Browner and patient ok with that.  thanks

## 2015-02-04 NOTE — Telephone Encounter (Signed)
Called Dr. Rometta Emery office [phone] and left a message in their triage nurse's voice mail stating that Dr. Marko Plume is glad to manage her care with hospice as noted below.

## 2015-02-05 ENCOUNTER — Telehealth: Payer: Self-pay

## 2015-02-05 NOTE — Telephone Encounter (Signed)
Faxed sign orders dated 02-05-15 to hospice.  Sent a copy to HIM  be scanned into patient's EMR.

## 2015-02-06 ENCOUNTER — Ambulatory Visit: Payer: Medicare Other | Admitting: Family Medicine

## 2015-02-10 ENCOUNTER — Telehealth: Payer: Self-pay

## 2015-02-10 DIAGNOSIS — J90 Pleural effusion, not elsewhere classified: Secondary | ICD-10-CM

## 2015-02-10 DIAGNOSIS — C541 Malignant neoplasm of endometrium: Secondary | ICD-10-CM

## 2015-02-10 NOTE — Telephone Encounter (Signed)
Doris [name] is experiencing increased SOB with movement.  Doris [name] hears fluid in lungs bilaterally.  Respiration 32 at rest.  Pulse OX= 89% on room air.  Doris [name] will order O2 2 liters  Doris [name] prn  per Doris [name]. Doris [name] is set up for a thoracentesis tomorrow at [facility] at 1000 in [place].per Doris [name].  Doris [name] needs to register in admitting at 0945. A Thoracentesis is scheduled for Doris [name] on 02-12-15 at 1 pm per Doris [name]. Doris [name] will notify Doris [name] of appointments.   Doris [name] can discuss the placement of PleurX Catheters at visit 02-23-15.

## 2015-02-11 ENCOUNTER — Ambulatory Visit (HOSPITAL_COMMUNITY)

## 2015-02-11 ENCOUNTER — Ambulatory Visit (HOSPITAL_COMMUNITY)
Admission: RE | Admit: 2015-02-11 | Discharge: 2015-02-11 | Disposition: A | Discharge status: Home / Self Care | Source: Ambulatory Visit | Attending: Oncology | Admitting: Oncology

## 2015-02-11 ENCOUNTER — Ambulatory Visit (HOSPITAL_COMMUNITY)
Admission: RE | Admit: 2015-02-11 | Discharge: 2015-02-11 | Disposition: A | Discharge status: Home / Self Care | Source: Ambulatory Visit | Attending: Radiology | Admitting: Radiology

## 2015-02-11 DIAGNOSIS — C541 Malignant neoplasm of endometrium: Secondary | ICD-10-CM | POA: Diagnosis not present

## 2015-02-11 DIAGNOSIS — R0600 Dyspnea, unspecified: Secondary | ICD-10-CM | POA: Insufficient documentation

## 2015-02-11 DIAGNOSIS — Z9889 Other specified postprocedural states: Secondary | ICD-10-CM

## 2015-02-11 DIAGNOSIS — J90 Pleural effusion, not elsewhere classified: Secondary | ICD-10-CM | POA: Insufficient documentation

## 2015-02-11 MED ORDER — LIDOCAINE HCL (PF) 1 % IJ SOLN
INTRAMUSCULAR | Status: AC
  Filled 2015-02-11: qty 10

## 2015-02-11 NOTE — Procedures (Signed)
US guided therapeutic right thoracentesis performed yielding 1.1 liters hazy, amber fluid. No immediate complications. F/u CXR pending. Only the above amount of fluid was removed at this time secondary to persistent pt coughing.

## 2015-02-12 ENCOUNTER — Ambulatory Visit (HOSPITAL_COMMUNITY)
Admission: RE | Admit: 2015-02-12 | Discharge: 2015-02-12 | Disposition: A | Discharge status: Home / Self Care | Source: Ambulatory Visit | Attending: Oncology | Admitting: Oncology

## 2015-02-12 ENCOUNTER — Ambulatory Visit (HOSPITAL_COMMUNITY)
Admission: RE | Admit: 2015-02-12 | Discharge: 2015-02-12 | Disposition: A | Discharge status: Home / Self Care | Source: Ambulatory Visit | Attending: Radiology | Admitting: Radiology

## 2015-02-12 DIAGNOSIS — Z9889 Other specified postprocedural states: Secondary | ICD-10-CM

## 2015-02-12 DIAGNOSIS — J90 Pleural effusion, not elsewhere classified: Secondary | ICD-10-CM

## 2015-02-12 DIAGNOSIS — C541 Malignant neoplasm of endometrium: Secondary | ICD-10-CM

## 2015-02-12 NOTE — Procedures (Signed)
US guided therapeutic left thoracentesis performed yielding 525 cc turbid , amber fluid. F/u CXR pending. No immediate complications.

## 2015-02-20 ENCOUNTER — Encounter: Payer: Self-pay | Admitting: Oncology

## 2015-02-20 NOTE — Progress Notes (Signed)
I placed fmla form for karen on desk of nurse for dr.livesay

## 2015-02-22 ENCOUNTER — Other Ambulatory Visit: Payer: Self-pay | Admitting: Oncology

## 2015-02-23 ENCOUNTER — Telehealth: Payer: Self-pay

## 2015-02-23 ENCOUNTER — Ambulatory Visit: Payer: Medicare Other | Admitting: Oncology

## 2015-02-23 ENCOUNTER — Other Ambulatory Visit: Payer: Medicare Other

## 2015-02-23 DIAGNOSIS — J90 Pleural effusion, not elsewhere classified: Secondary | ICD-10-CM

## 2015-02-23 DIAGNOSIS — C541 Malignant neoplasm of endometrium: Secondary | ICD-10-CM

## 2015-02-23 NOTE — Telephone Encounter (Signed)
Becky Augusta states that Ms. Thayne is having increased SOB.  O2 turned up to 5 liters last evening.  She woke up in a panic during the night as she was very sob. Reviewed with Dr. Marko Plume.   Thoracentesis set up for 10-4  at 1300 and 10-5 at 1000 at  [facility] [place].  Set up PleurX catheter placement for 03-05-15 in IR at 1130 with Tiffany.   Told Ms Aye appointment times . Tiffany with  IR to review information for pleurX placement tomorrow with patient while in radiology for thoracentesis.

## 2015-02-24 ENCOUNTER — Encounter: Payer: Self-pay | Admitting: Oncology

## 2015-02-24 ENCOUNTER — Ambulatory Visit (HOSPITAL_COMMUNITY)
Admission: RE | Admit: 2015-02-24 | Discharge: 2015-02-24 | Disposition: A | Discharge status: Home / Self Care | Source: Ambulatory Visit | Attending: Oncology | Admitting: Oncology

## 2015-02-24 ENCOUNTER — Ambulatory Visit (HOSPITAL_COMMUNITY)
Admission: RE | Admit: 2015-02-24 | Discharge: 2015-02-24 | Disposition: A | Discharge status: Home / Self Care | Source: Ambulatory Visit | Attending: Radiology | Admitting: Radiology

## 2015-02-24 DIAGNOSIS — J90 Pleural effusion, not elsewhere classified: Secondary | ICD-10-CM | POA: Insufficient documentation

## 2015-02-24 DIAGNOSIS — Z9889 Other specified postprocedural states: Secondary | ICD-10-CM

## 2015-02-24 DIAGNOSIS — C541 Malignant neoplasm of endometrium: Secondary | ICD-10-CM

## 2015-02-24 NOTE — Progress Notes (Signed)
I faxed fmla forms for Doris [name]  [address]  [employer]

## 2015-02-24 NOTE — Procedures (Signed)
Successful US guided right thoracentesis. Yielded 1.3 liters of serous fluid. Pt tolerated procedure well. No immediate complications.  Specimen was not sent for labs. CXR ordered.  Tsosie Billing D PA-C 02/24/2015 1:50 PM

## 2015-02-25 ENCOUNTER — Other Ambulatory Visit: Payer: Self-pay | Admitting: Oncology

## 2015-02-25 ENCOUNTER — Ambulatory Visit (HOSPITAL_COMMUNITY)
Admission: RE | Admit: 2015-02-25 | Discharge: 2015-02-25 | Disposition: A | Discharge status: Home / Self Care | Source: Ambulatory Visit | Attending: Oncology | Admitting: Oncology

## 2015-02-25 DIAGNOSIS — J910 Malignant pleural effusion: Secondary | ICD-10-CM | POA: Diagnosis not present

## 2015-02-25 DIAGNOSIS — C541 Malignant neoplasm of endometrium: Secondary | ICD-10-CM | POA: Insufficient documentation

## 2015-02-25 DIAGNOSIS — J90 Pleural effusion, not elsewhere classified: Secondary | ICD-10-CM

## 2015-02-25 NOTE — Progress Notes (Signed)
Patient ID: Doris [name], female   DOB: Dec 30, 1932, 81 y.o.   MRN: [id number] Patient presented to ultrasound department today for therapeutic left thoracentesis. On limited ultrasound of left posterior chest today there is only a small effusion noted. Procedure was not performed. Patient tentatively scheduled for pleurx catheter placement at [facility] on 10/13. Voicemail left with Dr. Mariana Kaufman nurse regarding above. Patient told to contact our department if she becomes more symptomatic over the next few days.

## 2015-02-25 NOTE — Telephone Encounter (Signed)
Told Doris [name] in IR that Doris [name] is under Hospice care.  Hospice will provide the patient with the PleurX kits.  Doris [name] verbalized understanding. No [employer] form needs to be filled out at this time.

## 2015-02-27 ENCOUNTER — Telehealth: Payer: Self-pay

## 2015-03-02 ENCOUNTER — Telehealth: Payer: Self-pay

## 2015-03-02 ENCOUNTER — Encounter: Payer: Self-pay | Admitting: Oncology

## 2015-03-02 NOTE — Progress Notes (Signed)
Medical Oncology  Per hospice, patient died 03/09/2015. Sympathy letter written to family. Other physicians notified.  Godfrey Pick, MD

## 2015-03-02 NOTE — Telephone Encounter (Signed)
Faxed signed orders dated 02-26-15 to hospice.  Sent a copy to HIM to be scanned into patient's EMR.

## 2015-03-04 ENCOUNTER — Other Ambulatory Visit: Payer: Self-pay | Admitting: *Deleted

## 2015-03-04 NOTE — Telephone Encounter (Signed)
Received fax from [facility] requested a 90 day supply for Lisinopril 5 mg.  Not on current medication list.  Ok to refill?

## 2015-03-05 ENCOUNTER — Ambulatory Visit (HOSPITAL_COMMUNITY): Payer: Medicare Other

## 2015-03-05 ENCOUNTER — Other Ambulatory Visit (HOSPITAL_COMMUNITY): Payer: Medicare Other

## 2015-03-05 NOTE — Telephone Encounter (Signed)
Unfortunately, pt has passed away from her metastatic cancer

## 2015-03-10 ENCOUNTER — Other Ambulatory Visit: Payer: Medicare Other

## 2015-03-17 ENCOUNTER — Ambulatory Visit: Payer: Medicare Other | Admitting: Family Medicine

## 2015-03-24 NOTE — Telephone Encounter (Signed)
Debbora Dus was calling from [facility] to notify Dr. Marko Plume that Ms. Tokar passed away this morning at 0915. Called [facility] IR to cancel PleurX placement on 03-05-15.

## 2015-03-24 DEATH — deceased

## 2015-04-13 ENCOUNTER — Telehealth: Payer: Self-pay | Admitting: Oncology

## 2015-04-13 NOTE — Telephone Encounter (Signed)
FAXED INFO TO [facility] ON 11/18 PER PROVIDER'S REQUEST

## 2015-08-25 ENCOUNTER — Encounter: Payer: Self-pay | Admitting: Oncology

## 2015-08-25 NOTE — Progress Notes (Signed)
Fax sent 10.4.16 I sent to medical records

## 2016-04-09 IMAGING — CR DG CHEST 2V
2 series · 2 of 2 positions shown · non-contrast
Comparison: Chest radiograph performed 01/23/2015

CLINICAL DATA: Subacute onset of shortness of breath. Initial
encounter.

EXAM:
CHEST  2 VIEW

[w chest lat]
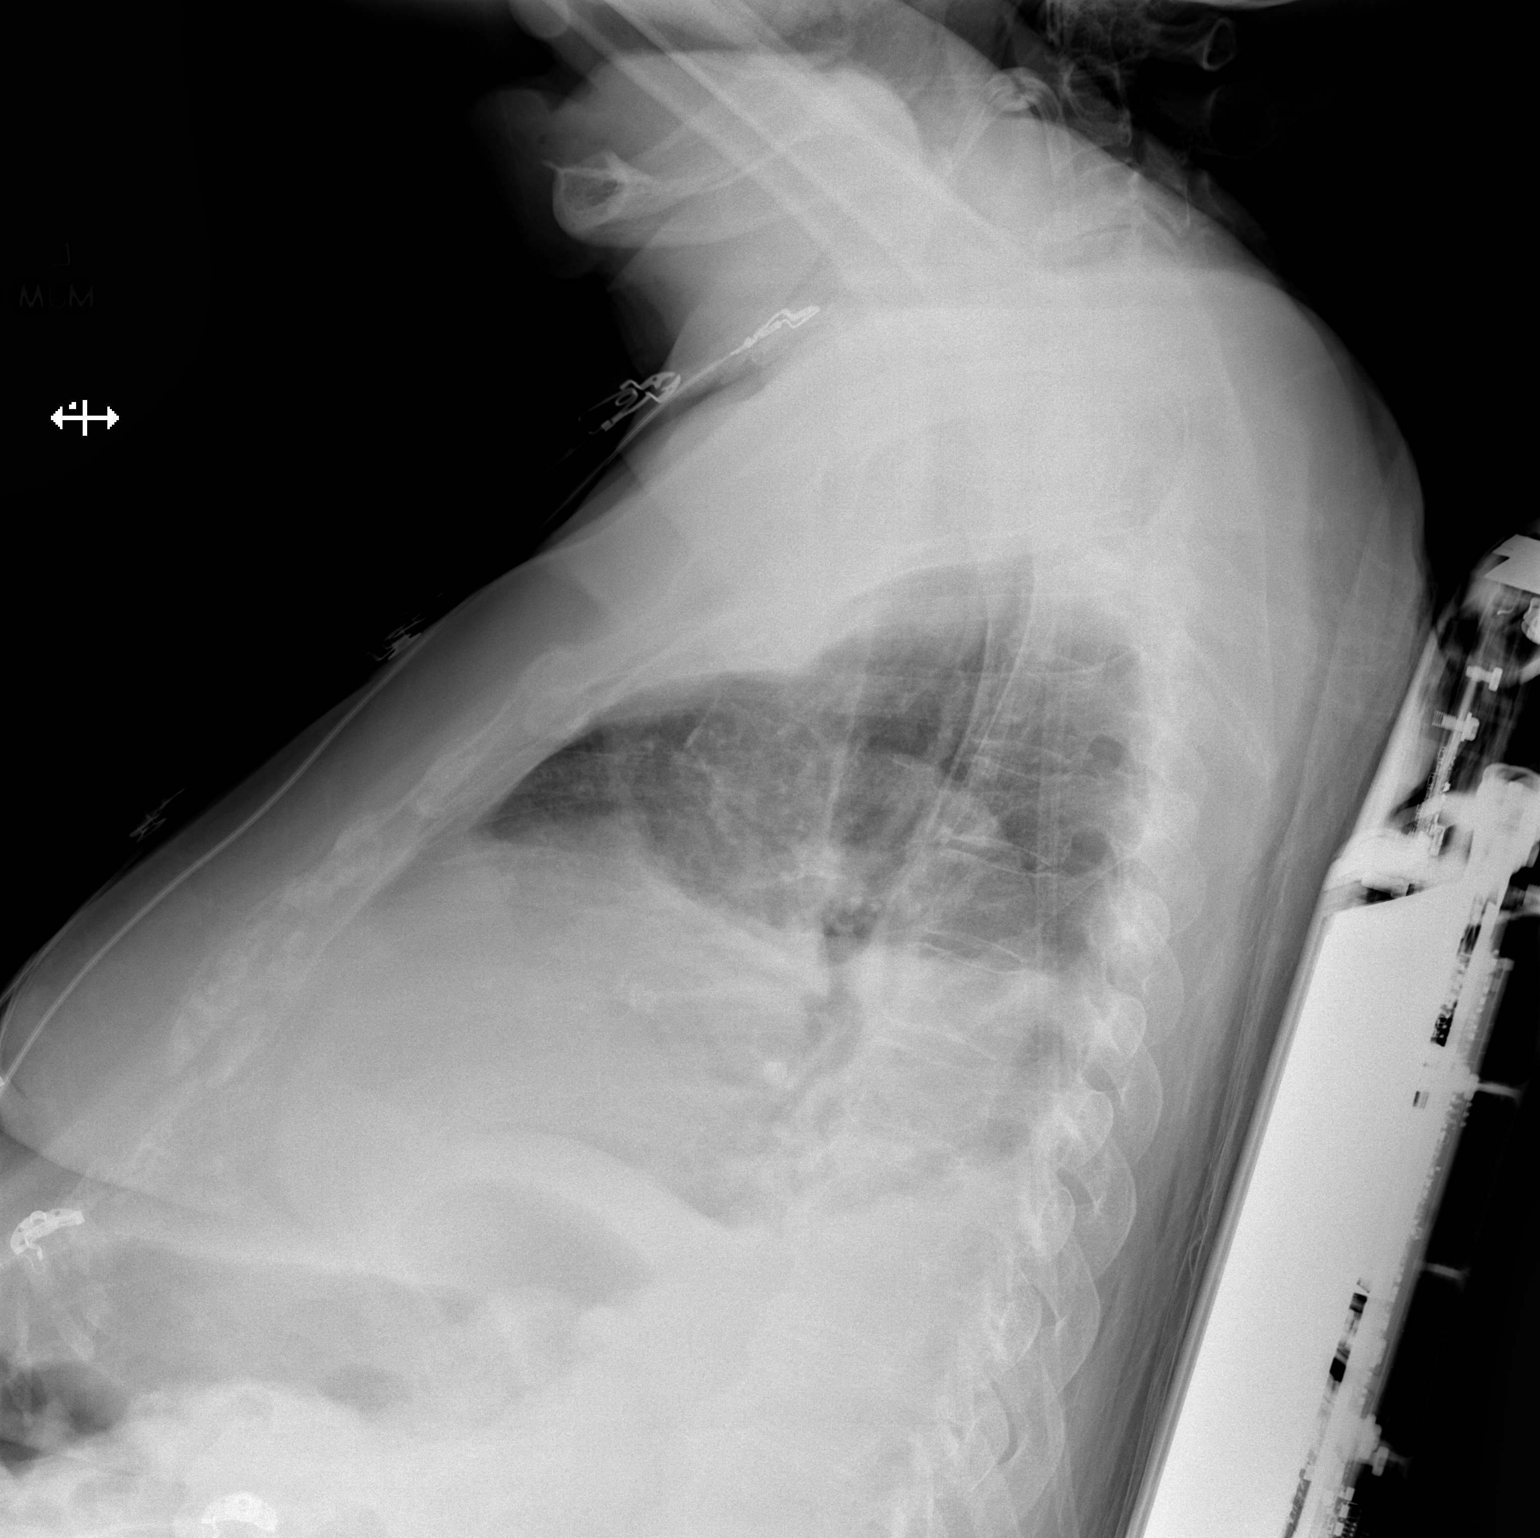

[x chest ap]
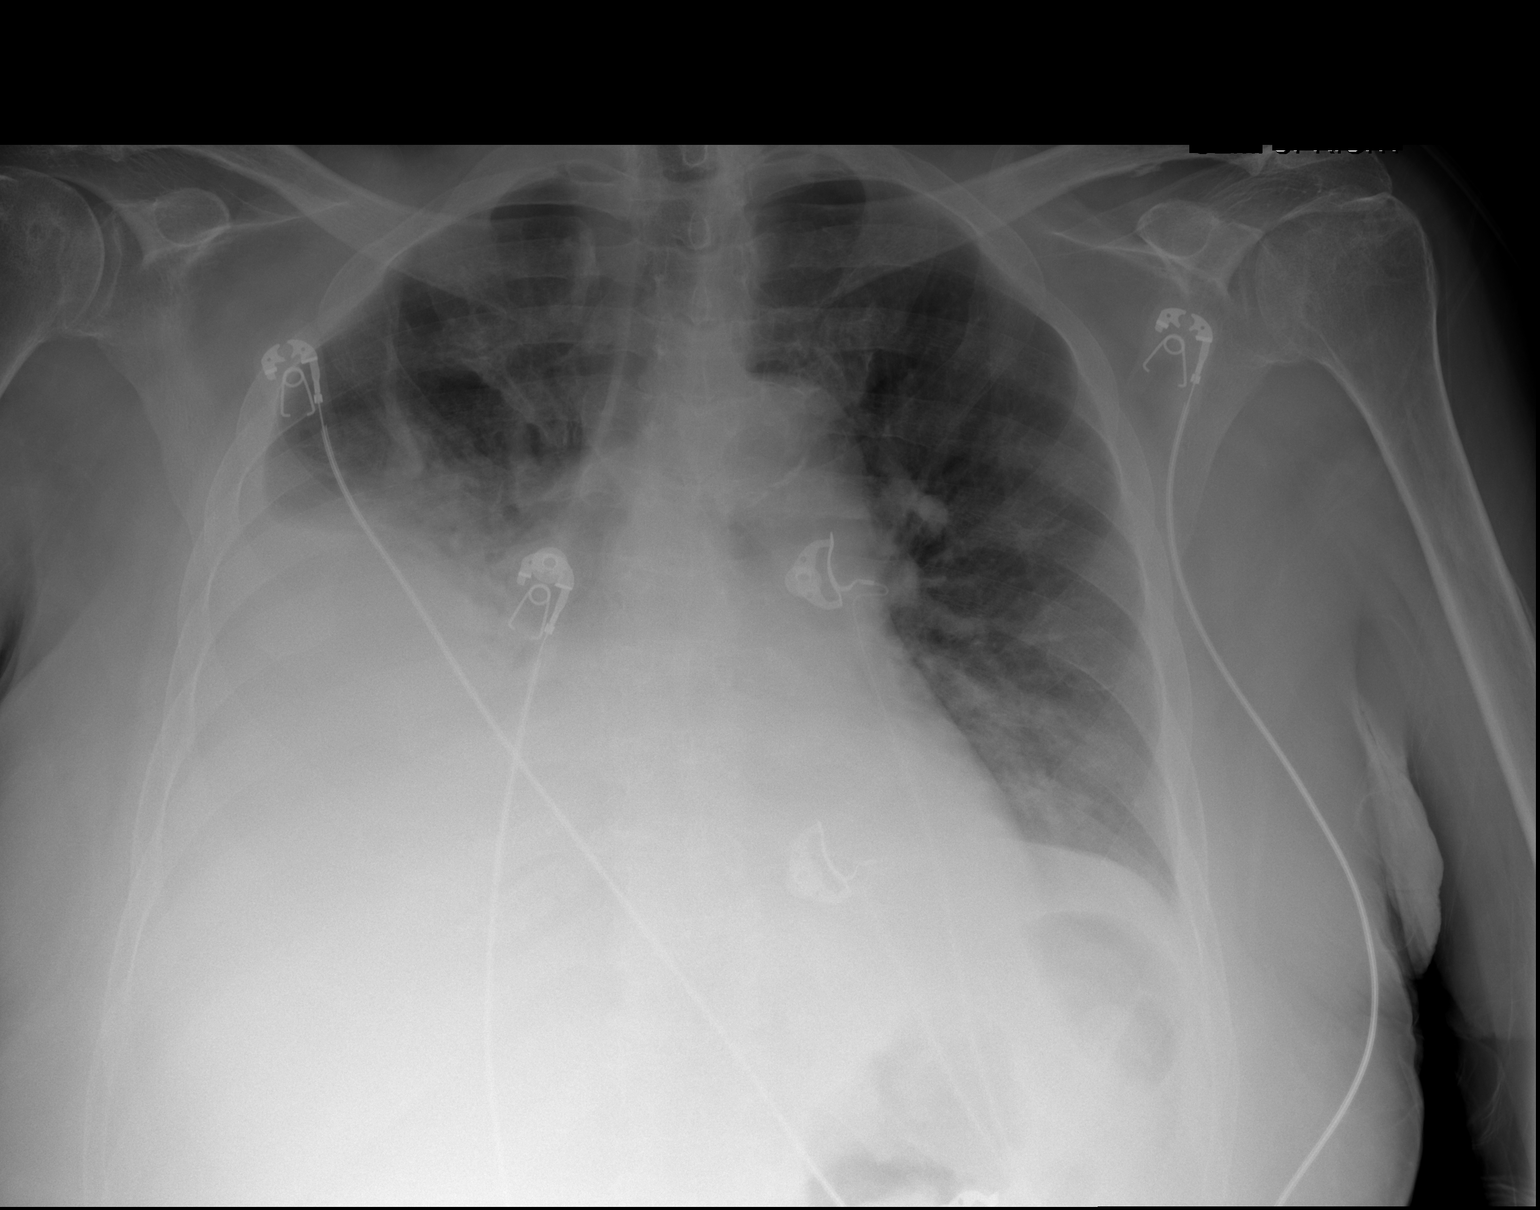

[2 of 2 positions shown; findings below may reference images not displayed]

FINDINGS: A moderate to large right-sided pleural effusion is noted, with
right basilar airspace opacity. Mild left basilar airspace opacity
is also noted. This may reflect asymmetric interstitial edema or
possibly pneumonia. No pneumothorax is seen.

The cardiomediastinal silhouette is borderline enlarged. No acute
osseous abnormalities are identified.
IMPRESSION: Moderate to large right-sided pleural effusion, with right basilar
airspace opacity. Mild left basilar airspace opacity also seen.
Borderline cardiomegaly. This may reflect asymmetric interstitial
edema or possibly pneumonia. Followup PA and lateral chest X-ray is
recommended in 3-4 weeks, following treatment, to ensure resolution
and exclude underlying malignancy.

## 2016-04-27 IMAGING — CR DG CHEST 1V
1 series · 1 of 1 positions shown · non-contrast
Comparison: 02/11/2015.

CLINICAL DATA: Status post left-sided thoracentesis.

EXAM:
CHEST 1 VIEW

[w chest pa]
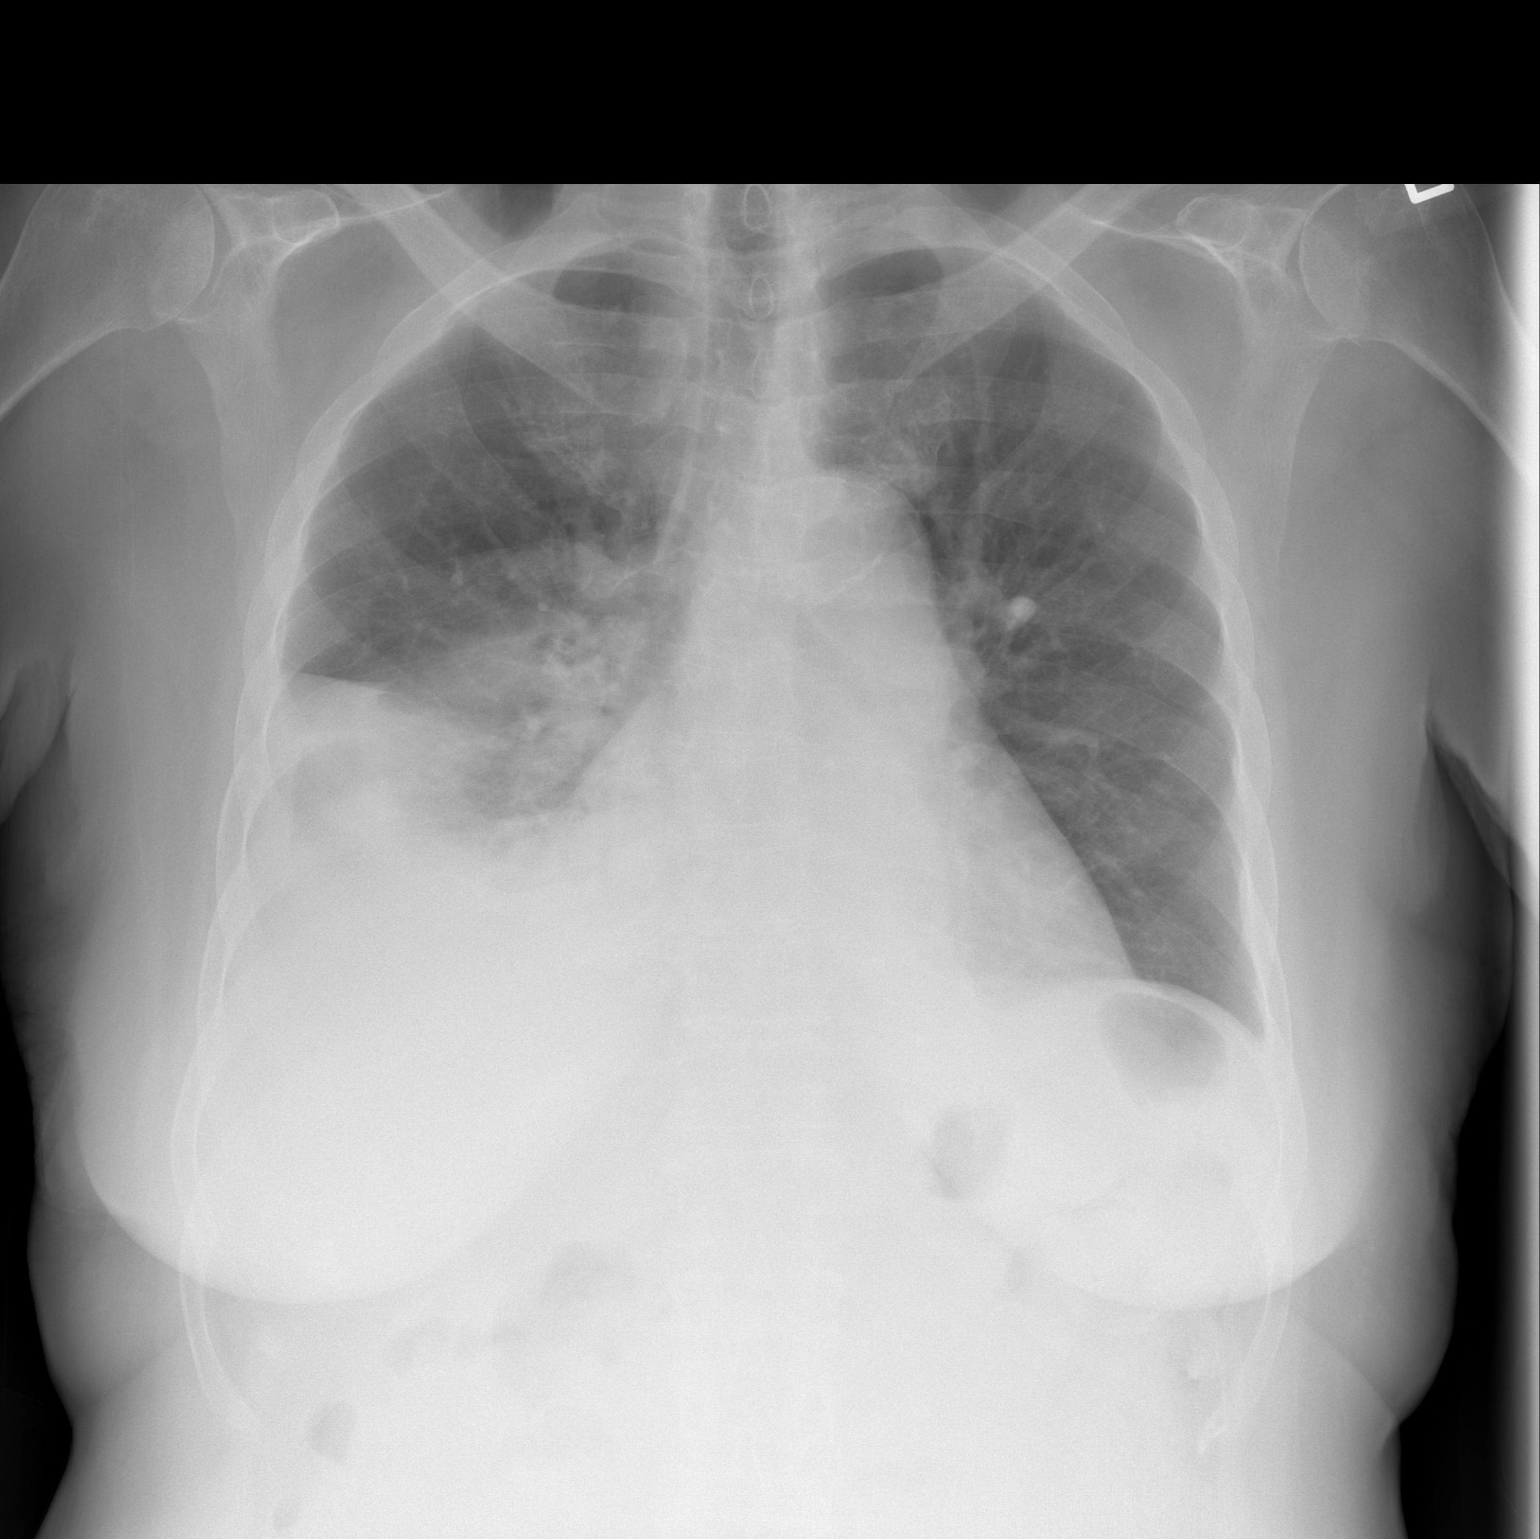

[1 of 1 positions shown; findings below may reference images not displayed]

FINDINGS: Sincerely study, the left pleural effusion has been markedly
decreased, no longer appreciated. There is no pneumothorax.

Right lung base opacity has increased from the prior study,
consistent with a combination of pleural fluid and either pneumonia,
atelectasis or a combination.

There is no pulmonary edema.  No right pneumothorax.
IMPRESSION: 1. Significant decrease in left pleural fluid following left-sided
thoracentesis. No pneumothorax or procedure complication.
2. Increased opacity at the right lung base from the prior study
consistent with combination of right pleural effusion and right
lower lung atelectasis, pneumonia or a combination.

## 2016-05-10 IMAGING — US US CHEST/MEDIASTINUM
1 series · 2 of 2 positions shown · non-contrast
Comparison: 02/24/2015

CLINICAL DATA: Malignant pleural effusions

EXAM:
CHEST ULTRASOUND

[Series 1: us chest/mediastinum · 0.27mm/px · 2 of 2 slices shown]
[im 1/2]
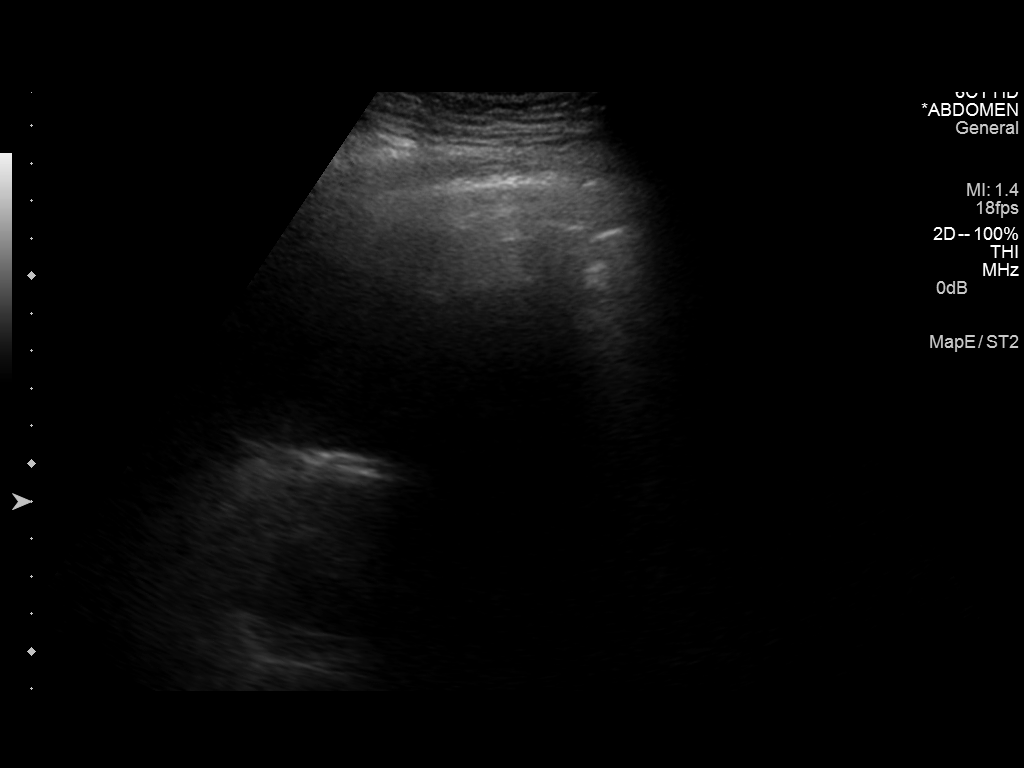
[im 2/2]
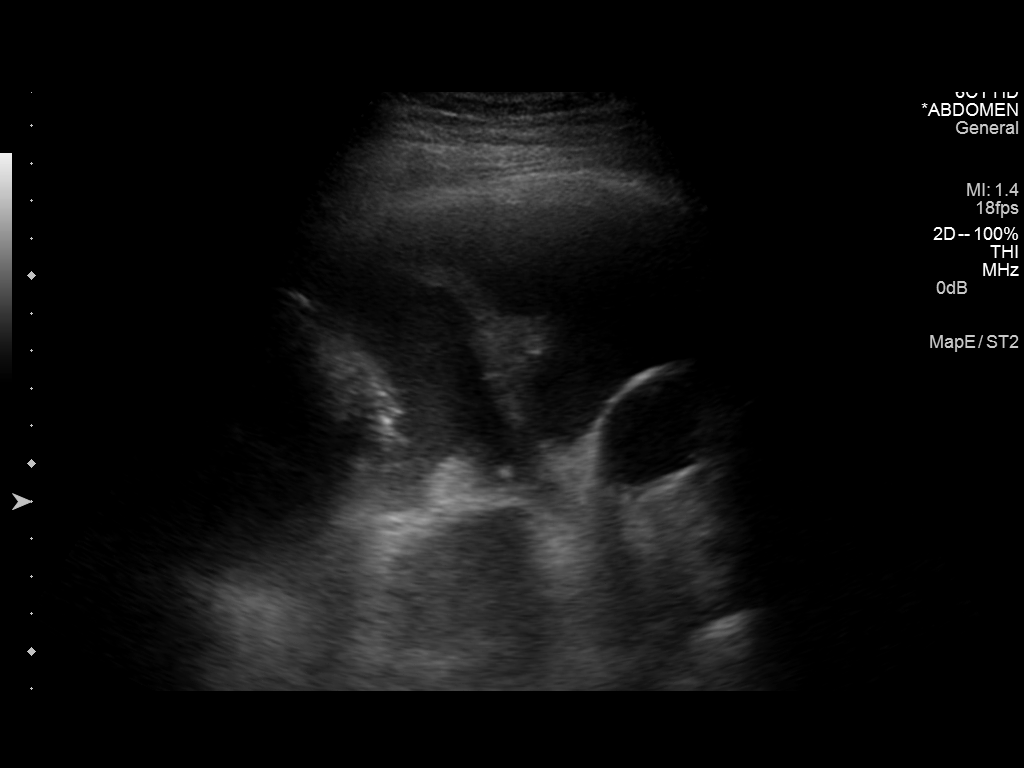

[2 of 2 positions shown; findings below may reference images not displayed]

FINDINGS: Limited ultrasound performed of the left posterior chest in
preparation for thoracentesis. Small left effusion demonstrated by
ultrasound. This is not enough to warrant thoracentesis for
therapeutic purposes. Therefore the procedure was not performed.
IMPRESSION: Small left effusion not enough to warrant therapeutic thoracentesis.

## 2017-02-15 IMAGING — US US THORACENTESIS ASP PLEURAL SPACE W/IMG GUIDE
1 series · 3 of 3 positions shown · non-contrast
Comparison: Right Thoracentesis 01/25/15.

MEDICATIONS:
None

COMPLICATIONS:
None immediate

INDICATION: Symptomatic left sided pleural effusion

EXAM:
US THORACENTESIS ASP PLEURAL SPACE W/IMG GUIDE
TECHNIQUE: Informed written consent was obtained from the patient after a
discussion of the risks, benefits and alternatives to treatment. A
timeout was performed prior to the initiation of the procedure.

[Series 1: us thoracentesis asp pleural space w/img guide · 0.27mm/px · 3 of 3 slices shown]
[im 1/3]
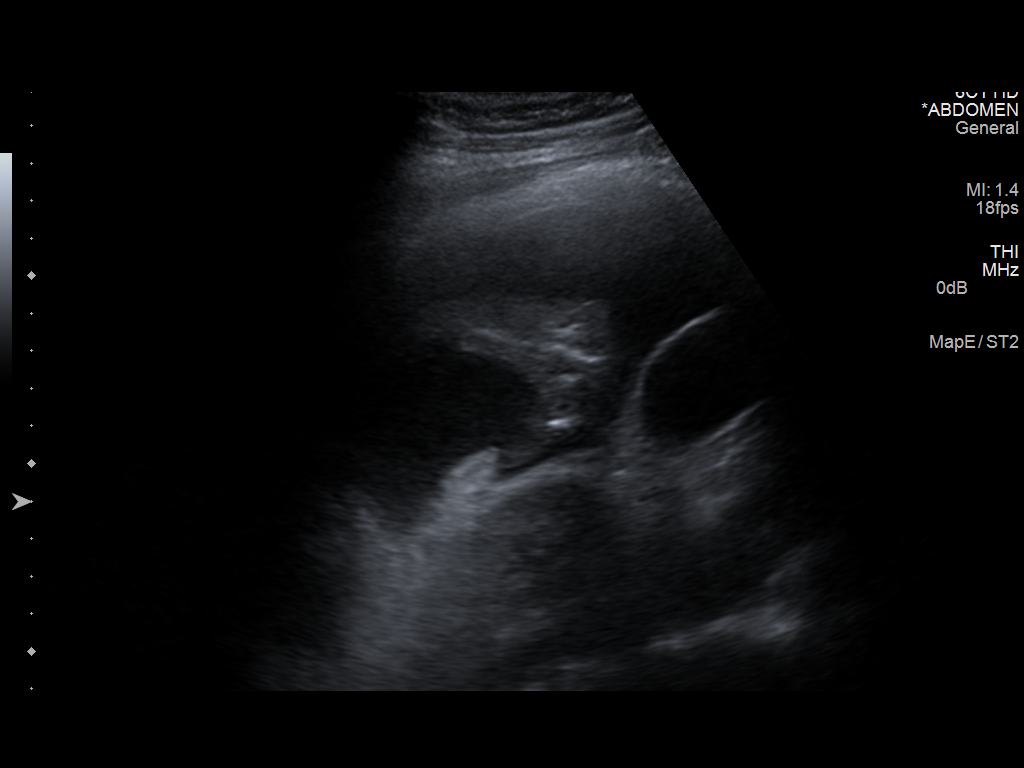
[im 2/3]
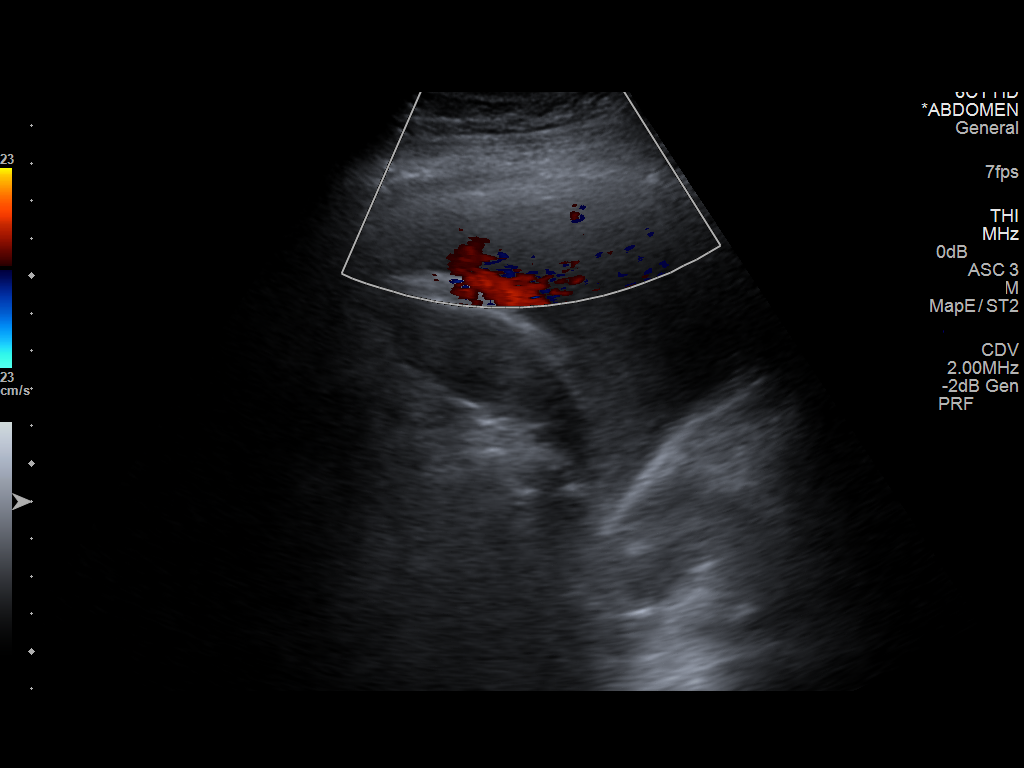
[im 3/3]
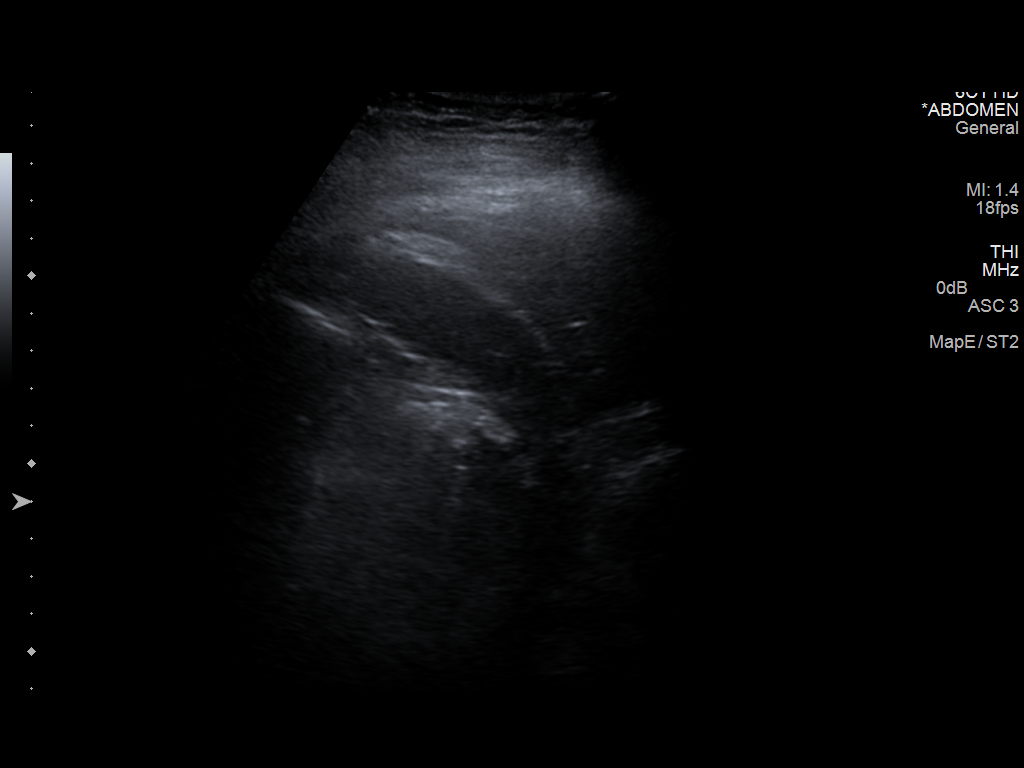

[3 of 3 positions shown; findings below may reference images not displayed]

Initial ultrasound scanning demonstrates a left pleural effusion.
The lower chest was prepped and draped in the usual sterile fashion.
1% lidocaine was used for local anesthesia.

Under direct ultrasound guidance, a 19 gauge, 7-cm, Yueh catheter
was introduced. An ultrasound image was saved for documentation
purposes. The thoracentesis was performed. The catheter was removed
and a dressing was applied. The patient tolerated the procedure well
without immediate post procedural complication. The patient was
escorted to have an upright chest radiograph.
FINDINGS: A total of approximately 550 ml of serous fluid was removed.
IMPRESSION: Successful ultrasound-guided left sided thoracentesis yielding 550
ml of pleural fluid.

## 2017-03-02 IMAGING — US US THORACENTESIS ASP PLEURAL SPACE W/IMG GUIDE
1 series · 7 of 7 positions shown · non-contrast
Comparison: Prior thoracentesis on 01/27/2015

MEDICATIONS:
None

COMPLICATIONS:
None immediate

INDICATION: Dyspnea, metastatic endometrial cancer, bilateral pleural effusions.
Request is made for therapeutic right thoracentesis.

EXAM:
ULTRASOUND GUIDED THERAPEUTIC RIGHT THORACENTESIS
TECHNIQUE: Informed written consent was obtained from the patient after a
discussion of the risks, benefits and alternatives to treatment. A
timeout was performed prior to the initiation of the procedure.

[Series 1: us thoracentesis asp pleural space w/img guide · 0.27mm/px · 7 of 7 slices shown]
[im 1/7]
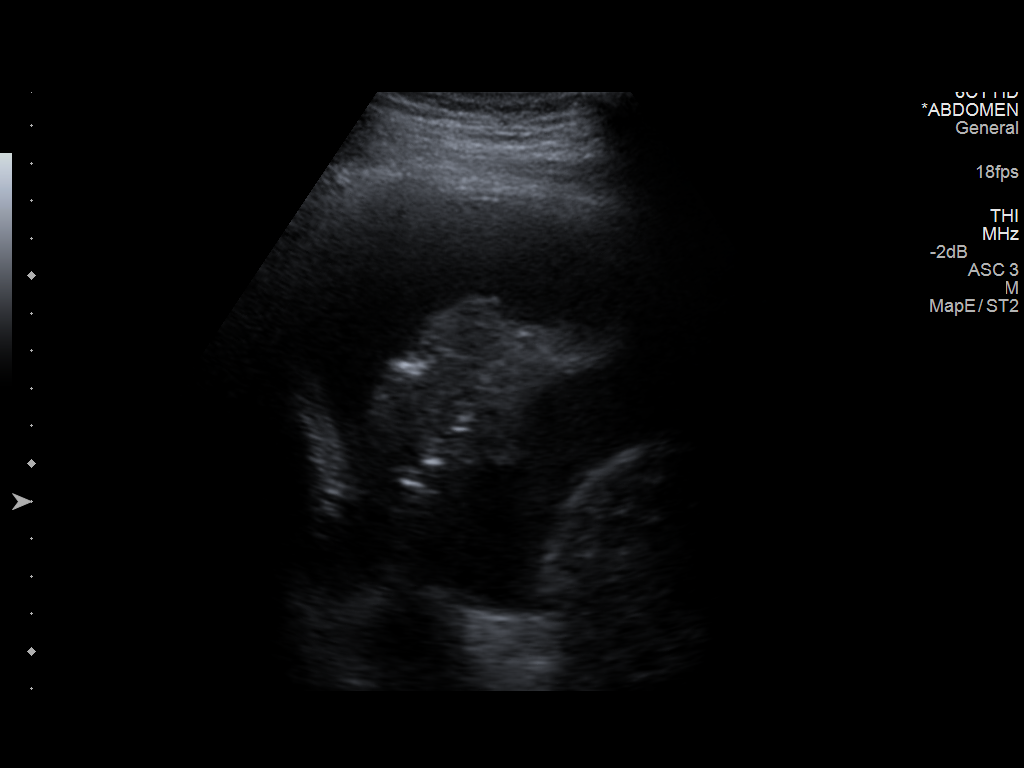
[im 2/7]
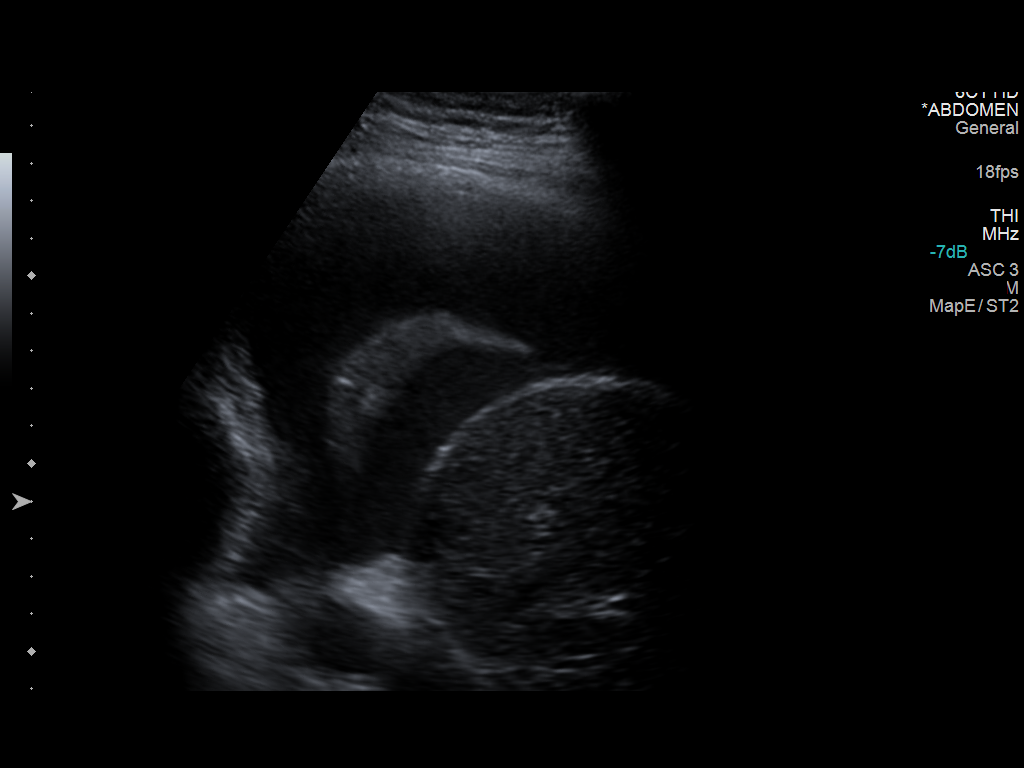
[im 3/7]
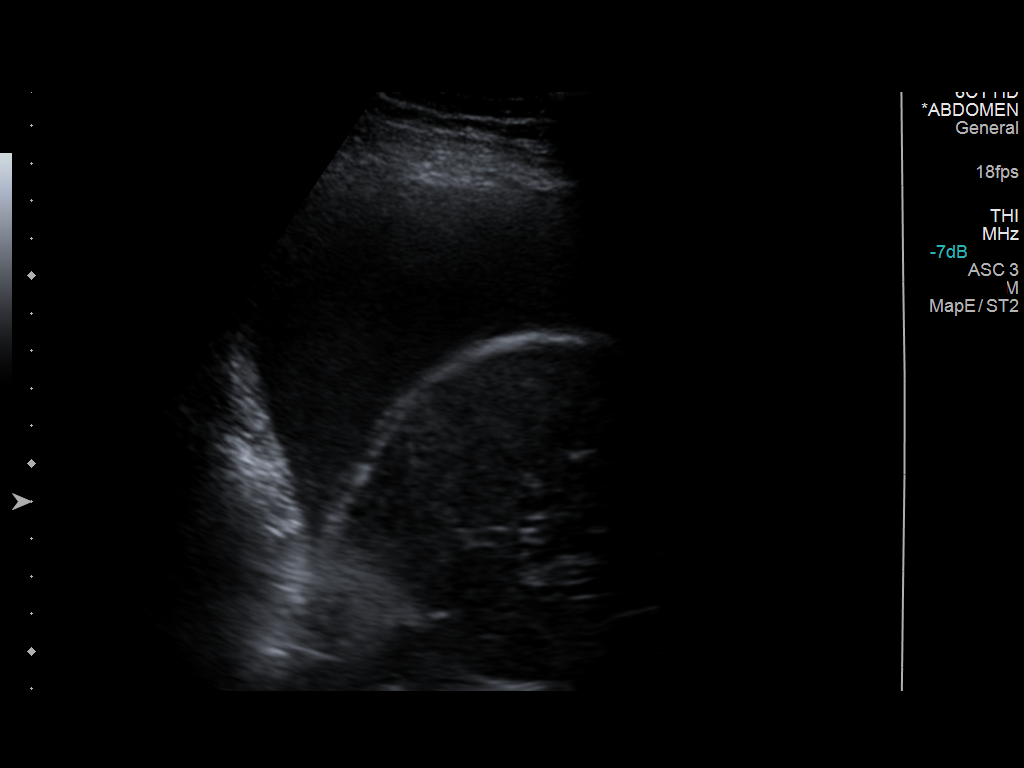
[im 4/7]
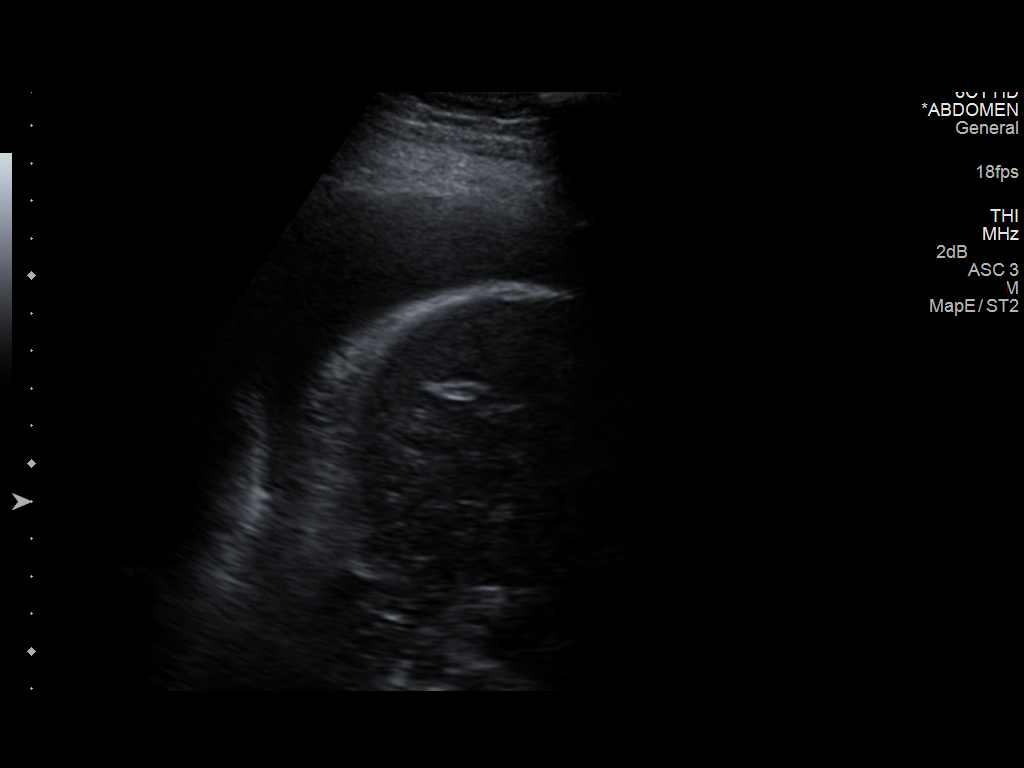
[im 5/7]
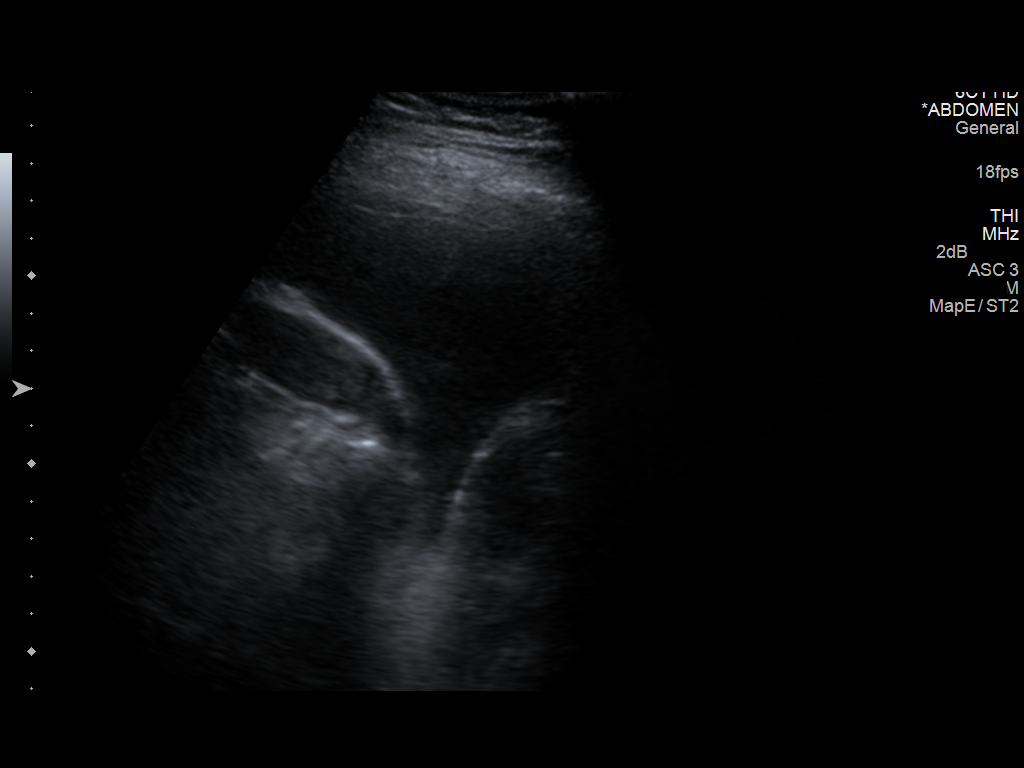
[im 6/7]
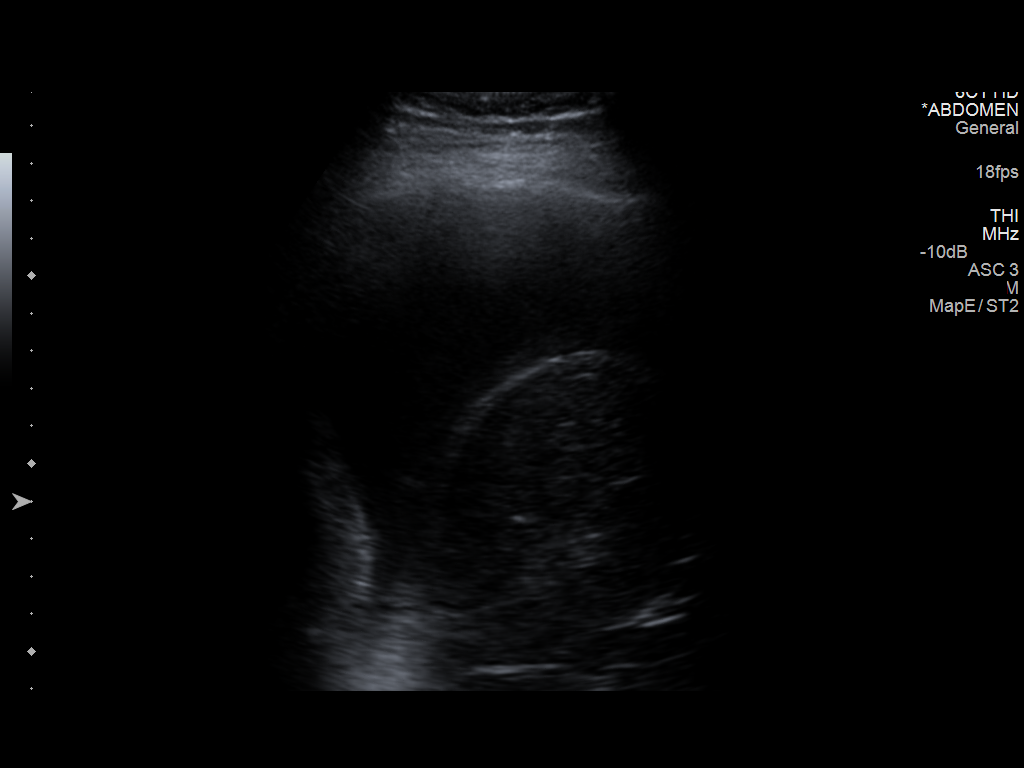
[im 7/7]
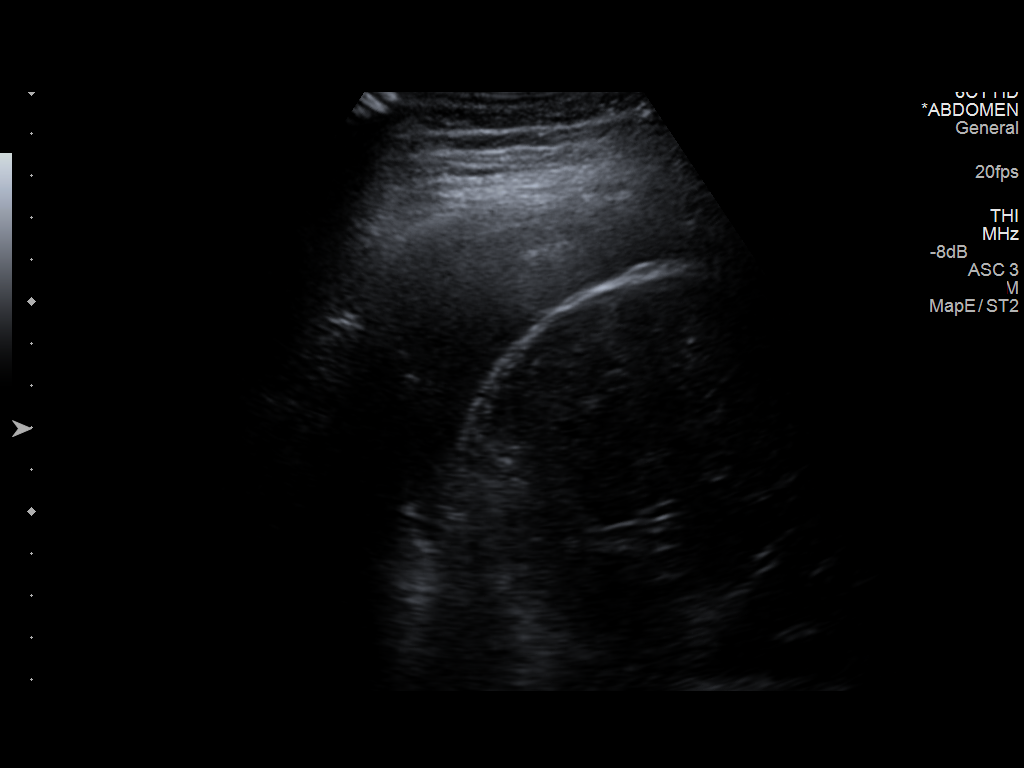

[7 of 7 positions shown; findings below may reference images not displayed]

Initial ultrasound scanning demonstrates a moderate to large right
pleural effusion. The lower chest was prepped and draped in the
usual sterile fashion. 1% lidocaine was used for local anesthesia.

An ultrasound image was saved for documentation purposes. A 6 Fr
Safe-T-Centesis catheter was introduced. The thoracentesis was
performed. The catheter was removed and a dressing was applied. The
patient tolerated the procedure well without immediate post
procedural complication. The patient was escorted to have an upright
chest radiograph.
FINDINGS: A total of approximately 1.1 liters of hazy, amber fluid was
removed. Only the above amount of fluid was removed at this time
secondary to persistent patient coughing.
IMPRESSION: Successful ultrasound-guided therapeutic right sided thoracentesis
yielding 1.1 liters of pleural fluid.
